# Patient Record
Sex: Female | Born: 1940
Health system: Southern US, Community
[De-identification: ages and names within clinical notes are randomized; demographics above are authoritative.]

## PROBLEM LIST (undated history)

## (undated) DIAGNOSIS — Z87891 Personal history of nicotine dependence: Secondary | ICD-10-CM

## (undated) DIAGNOSIS — Z853 Personal history of malignant neoplasm of breast: Secondary | ICD-10-CM

## (undated) DIAGNOSIS — F329 Major depressive disorder, single episode, unspecified: Secondary | ICD-10-CM

## (undated) DIAGNOSIS — R0600 Dyspnea, unspecified: Secondary | ICD-10-CM

## (undated) DIAGNOSIS — C50919 Malignant neoplasm of unspecified site of unspecified female breast: Secondary | ICD-10-CM

## (undated) DIAGNOSIS — J302 Other seasonal allergic rhinitis: Secondary | ICD-10-CM

## (undated) DIAGNOSIS — E669 Obesity, unspecified: Secondary | ICD-10-CM

## (undated) DIAGNOSIS — E119 Type 2 diabetes mellitus without complications: Secondary | ICD-10-CM

## (undated) DIAGNOSIS — K760 Fatty (change of) liver, not elsewhere classified: Secondary | ICD-10-CM

## (undated) DIAGNOSIS — M1990 Unspecified osteoarthritis, unspecified site: Secondary | ICD-10-CM

## (undated) DIAGNOSIS — Z85118 Personal history of other malignant neoplasm of bronchus and lung: Secondary | ICD-10-CM

## (undated) DIAGNOSIS — G4730 Sleep apnea, unspecified: Secondary | ICD-10-CM

## (undated) DIAGNOSIS — L309 Dermatitis, unspecified: Secondary | ICD-10-CM

## (undated) DIAGNOSIS — K2100 Gastro-esophageal reflux disease with esophagitis, without bleeding: Secondary | ICD-10-CM

## (undated) DIAGNOSIS — Z8719 Personal history of other diseases of the digestive system: Secondary | ICD-10-CM

## (undated) DIAGNOSIS — Z1389 Encounter for screening for other disorder: Secondary | ICD-10-CM

## (undated) DIAGNOSIS — I10 Essential (primary) hypertension: Secondary | ICD-10-CM

## (undated) DIAGNOSIS — E785 Hyperlipidemia, unspecified: Secondary | ICD-10-CM

## (undated) DIAGNOSIS — I83893 Varicose veins of bilateral lower extremities with other complications: Secondary | ICD-10-CM

## (undated) DIAGNOSIS — J439 Emphysema, unspecified: Secondary | ICD-10-CM

## (undated) DIAGNOSIS — F32A Depression, unspecified: Secondary | ICD-10-CM

## (undated) DIAGNOSIS — Z9989 Dependence on other enabling machines and devices: Secondary | ICD-10-CM

## (undated) DIAGNOSIS — C3490 Malignant neoplasm of unspecified part of unspecified bronchus or lung: Secondary | ICD-10-CM

## (undated) DIAGNOSIS — K219 Gastro-esophageal reflux disease without esophagitis: Secondary | ICD-10-CM

## (undated) DIAGNOSIS — IMO0002 Reserved for concepts with insufficient information to code with codable children: Secondary | ICD-10-CM

## (undated) DIAGNOSIS — G709 Myoneural disorder, unspecified: Secondary | ICD-10-CM

## (undated) DIAGNOSIS — C439 Malignant melanoma of skin, unspecified: Secondary | ICD-10-CM

## (undated) DIAGNOSIS — J45909 Unspecified asthma, uncomplicated: Secondary | ICD-10-CM

## (undated) DIAGNOSIS — Z923 Personal history of irradiation: Secondary | ICD-10-CM

## (undated) HISTORY — PX: COLONOSCOPY: SHX174

## (undated) HISTORY — DX: Dependence on other enabling machines and devices: Z99.89

## (undated) HISTORY — DX: Obesity, unspecified: E66.9

## (undated) HISTORY — DX: Other seasonal allergic rhinitis: J30.2

## (undated) HISTORY — DX: Personal history of nicotine dependence: Z87.891

## (undated) HISTORY — DX: Sleep apnea, unspecified: G47.30

## (undated) HISTORY — DX: Varicose veins of bilateral lower extremities with other complications: I83.893

## (undated) HISTORY — DX: Personal history of malignant neoplasm of breast: Z85.3

## (undated) HISTORY — DX: Personal history of other malignant neoplasm of bronchus and lung: Z85.118

## (undated) HISTORY — DX: Reserved for concepts with insufficient information to code with codable children: IMO0002

## (undated) HISTORY — DX: Emphysema, unspecified: J43.9

## (undated) HISTORY — DX: Essential (primary) hypertension: I10

## (undated) HISTORY — DX: Unspecified osteoarthritis, unspecified site: M19.90

## (undated) HISTORY — DX: Type 2 diabetes mellitus without complications: E11.9

## (undated) HISTORY — PX: BREAST BIOPSY: SHX20

## (undated) HISTORY — DX: Malignant melanoma of skin, unspecified: C43.9

## (undated) HISTORY — DX: Unspecified asthma, uncomplicated: J45.909

## (undated) HISTORY — DX: Encounter for screening for other disorder: Z13.89

---

## 1970-04-10 HISTORY — PX: ABDOMINAL HYSTERECTOMY: SHX81

## 1970-04-10 HISTORY — PX: SALPINGOOPHORECTOMY: SHX82

## 1975-04-11 HISTORY — PX: KNEE SURGERY: SHX244

## 1987-04-11 HISTORY — PX: LOBECTOMY: SHX5089

## 1988-04-10 HISTORY — PX: HERNIA REPAIR: SHX51

## 1990-04-10 HISTORY — PX: EYE SURGERY: SHX253

## 1997-04-10 HISTORY — PX: KNEE SURGERY: SHX244

## 2004-04-19 ENCOUNTER — Ambulatory Visit: Payer: Self-pay | Admitting: Internal Medicine

## 2004-05-11 ENCOUNTER — Ambulatory Visit: Payer: Self-pay | Admitting: Internal Medicine

## 2005-01-04 ENCOUNTER — Ambulatory Visit: Payer: Self-pay | Admitting: Obstetrics and Gynecology

## 2005-01-12 ENCOUNTER — Ambulatory Visit: Payer: Self-pay | Admitting: Obstetrics and Gynecology

## 2005-02-01 ENCOUNTER — Ambulatory Visit: Payer: Self-pay | Admitting: Cardiology

## 2005-06-15 ENCOUNTER — Ambulatory Visit: Payer: Self-pay | Admitting: General Surgery

## 2005-06-19 ENCOUNTER — Ambulatory Visit: Payer: Self-pay | Admitting: General Surgery

## 2005-08-23 ENCOUNTER — Ambulatory Visit: Payer: Self-pay | Admitting: Family Medicine

## 2006-01-09 ENCOUNTER — Ambulatory Visit: Payer: Self-pay | Admitting: Specialist

## 2006-06-11 ENCOUNTER — Ambulatory Visit: Payer: Self-pay | Admitting: General Surgery

## 2006-08-06 ENCOUNTER — Ambulatory Visit: Payer: Self-pay | Admitting: Unknown Physician Specialty

## 2006-08-09 ENCOUNTER — Ambulatory Visit: Payer: Self-pay | Admitting: Unknown Physician Specialty

## 2006-10-04 ENCOUNTER — Ambulatory Visit: Payer: Self-pay | Admitting: Unknown Physician Specialty

## 2007-06-17 ENCOUNTER — Ambulatory Visit: Payer: Self-pay | Admitting: General Surgery

## 2007-11-15 ENCOUNTER — Ambulatory Visit: Payer: Self-pay | Admitting: Family Medicine

## 2007-12-09 ENCOUNTER — Ambulatory Visit: Payer: Self-pay | Admitting: Family Medicine

## 2008-04-10 DIAGNOSIS — Z923 Personal history of irradiation: Secondary | ICD-10-CM

## 2008-04-10 DIAGNOSIS — C50919 Malignant neoplasm of unspecified site of unspecified female breast: Secondary | ICD-10-CM

## 2008-04-10 HISTORY — PX: BREAST EXCISIONAL BIOPSY: SUR124

## 2008-04-10 HISTORY — DX: Malignant neoplasm of unspecified site of unspecified female breast: C50.919

## 2008-04-10 HISTORY — PX: BREAST SURGERY: SHX581

## 2008-04-10 HISTORY — DX: Personal history of irradiation: Z92.3

## 2008-04-10 HISTORY — PX: BREAST LUMPECTOMY: SHX2

## 2008-06-22 ENCOUNTER — Ambulatory Visit: Payer: Self-pay | Admitting: General Surgery

## 2008-06-25 ENCOUNTER — Ambulatory Visit: Payer: Self-pay | Admitting: General Surgery

## 2008-06-30 ENCOUNTER — Ambulatory Visit: Payer: Self-pay | Admitting: General Surgery

## 2008-07-09 ENCOUNTER — Ambulatory Visit: Payer: Self-pay | Admitting: Radiation Oncology

## 2008-07-13 ENCOUNTER — Ambulatory Visit: Payer: Self-pay | Admitting: Cardiology

## 2008-07-13 ENCOUNTER — Ambulatory Visit: Payer: Self-pay | Admitting: General Surgery

## 2008-07-16 ENCOUNTER — Ambulatory Visit: Payer: Self-pay | Admitting: General Surgery

## 2008-07-27 ENCOUNTER — Ambulatory Visit: Payer: Self-pay | Admitting: Radiation Oncology

## 2008-08-08 ENCOUNTER — Ambulatory Visit: Payer: Self-pay | Admitting: Radiation Oncology

## 2008-09-08 ENCOUNTER — Ambulatory Visit: Payer: Self-pay | Admitting: Radiation Oncology

## 2008-10-08 ENCOUNTER — Ambulatory Visit: Payer: Self-pay | Admitting: Radiation Oncology

## 2008-12-10 ENCOUNTER — Ambulatory Visit: Payer: Self-pay | Admitting: General Surgery

## 2009-02-08 ENCOUNTER — Ambulatory Visit: Payer: Self-pay | Admitting: Radiation Oncology

## 2009-02-15 ENCOUNTER — Ambulatory Visit: Payer: Self-pay | Admitting: Radiation Oncology

## 2009-03-10 ENCOUNTER — Ambulatory Visit: Payer: Self-pay | Admitting: Radiation Oncology

## 2009-06-29 ENCOUNTER — Ambulatory Visit: Payer: Self-pay | Admitting: General Surgery

## 2009-08-08 ENCOUNTER — Ambulatory Visit: Payer: Self-pay | Admitting: Radiation Oncology

## 2009-08-23 ENCOUNTER — Ambulatory Visit: Payer: Self-pay | Admitting: Radiation Oncology

## 2009-09-08 ENCOUNTER — Ambulatory Visit: Payer: Self-pay | Admitting: Radiation Oncology

## 2009-12-07 HISTORY — PX: MELANOMA EXCISION: SHX5266

## 2009-12-09 HISTORY — PX: VEIN SURGERY: SHX48

## 2010-07-04 ENCOUNTER — Ambulatory Visit: Payer: Self-pay | Admitting: General Surgery

## 2010-07-12 ENCOUNTER — Ambulatory Visit: Payer: Self-pay | Admitting: General Surgery

## 2010-08-18 ENCOUNTER — Ambulatory Visit: Payer: Self-pay | Admitting: Radiation Oncology

## 2010-09-09 ENCOUNTER — Ambulatory Visit: Payer: Self-pay | Admitting: Radiation Oncology

## 2010-09-23 ENCOUNTER — Ambulatory Visit: Payer: Self-pay | Admitting: Unknown Physician Specialty

## 2011-07-12 ENCOUNTER — Ambulatory Visit: Payer: Self-pay | Admitting: General Surgery

## 2011-10-25 ENCOUNTER — Ambulatory Visit: Payer: Self-pay | Admitting: Radiation Oncology

## 2011-11-09 ENCOUNTER — Ambulatory Visit: Payer: Self-pay | Admitting: Radiation Oncology

## 2012-02-29 ENCOUNTER — Ambulatory Visit: Payer: Self-pay | Admitting: Family Medicine

## 2012-06-01 ENCOUNTER — Encounter: Payer: Self-pay | Admitting: General Surgery

## 2012-07-30 ENCOUNTER — Ambulatory Visit: Payer: Self-pay | Admitting: General Surgery

## 2012-08-07 ENCOUNTER — Encounter: Payer: Self-pay | Admitting: General Surgery

## 2012-08-15 ENCOUNTER — Ambulatory Visit (INDEPENDENT_AMBULATORY_CARE_PROVIDER_SITE_OTHER): Payer: Medicare Other | Admitting: General Surgery

## 2012-08-15 ENCOUNTER — Other Ambulatory Visit: Payer: Self-pay | Admitting: *Deleted

## 2012-08-15 ENCOUNTER — Encounter: Payer: Self-pay | Admitting: General Surgery

## 2012-08-15 VITALS — BP 126/84 | HR 80 | Resp 16 | Ht 62.0 in | Wt 220.0 lb

## 2012-08-15 DIAGNOSIS — Z85118 Personal history of other malignant neoplasm of bronchus and lung: Secondary | ICD-10-CM

## 2012-08-15 DIAGNOSIS — Z853 Personal history of malignant neoplasm of breast: Secondary | ICD-10-CM | POA: Insufficient documentation

## 2012-08-15 NOTE — Patient Instructions (Signed)
Call for any problems otherwise follow up in one year.

## 2012-08-15 NOTE — Progress Notes (Signed)
The patient has been asked to return to the office in one year for a bilateral diagnostic mammogram. 

## 2012-08-15 NOTE — Progress Notes (Signed)
Patient ID: Kathryn Hammond, female   DOB: 1940/12/16, 72 y.o.   MRN: [id number]  Chief Complaint  Patient presents with  . Follow-up    mammogram follow up    HPI Kathryn Hammond is a 72 y.o. female here for follow up of yearly mammogram done on 07-30-12. No complaints of any breast symptoms. Patient does have a history of DCIS of left breast treated with lumpectomy and radiation therapy in 2010. Patient stated she does self breast exams.Patient is also s/r right lung lobectomy for cancer in 1989. Has had RF ablations of GSV bilateral. HPI  Past Medical History  Diagnosis Date  . Seasonal allergies   . Cellulitis and abscess of trunk   . Emphysema   . Unspecified essential hypertension   . Personal history of malignant neoplasm of bronchus and lung     right lung cancer  . Personal history of tobacco use, presenting hazards to health   . Asthma   . Ulcer     peptic  . Sleep apnea   . Arthritis   . Personal history of malignant neoplasm of breast     left breast DCIS  . Breast screening, unspecified   . Special screening for malignant neoplasms, colon   . Diabetes     non-insulin dependent  . Obesity, unspecified   . Varicose veins of lower extremities with other complications   . Screening for obesity   . Melanoma     right temple  . CPAP (continuous positive airway pressure) dependence 15 years    Past Surgical History  Procedure Laterality Date  . Knee surgery  1999    knee replaced  . Knee surgery  1977    knees replaced  . Salpingoophorectomy  1972  . Abdominal hysterectomy  1972  . Lobectomy  1989  . Hernia repair  1990  . Eye surgery  1992  . Colonoscopy  2007 and 2012    Dr. Mechele Collin at [facility]; polyps  . 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 Melanoma excision  December 07, 2009    right temple  . Breast surgery Left 2010    mammosite balloon placement and removal  . Breast lumpectomy Left  2010    R for DCIS  . Breast biopsy      Family History  Problem Relation Age of Onset  . Ovarian cancer Mother     Social History History  Substance Use Topics  . Smoking status: Former Smoker -- 30 years    Quit date: 04/10/1988  . Smokeless tobacco: Not on file  . Alcohol Use: Yes     Comment: once in a while    Allergies  Allergen Reactions  . Tape Itching  . Tegaderm Ag Mesh (Silver) Other (See Comments)    blisters  . Zantac (Ranitidine Hcl) Hives    Current Outpatient Prescriptions  Medication Sig Dispense Refill  . Acetaminophen (TYLENOL 8 HOUR PO) Take by mouth 3 (three) times daily.       . Cholecalciferol (DIALYVITE VITAMIN D 5000 PO) Take 1 tablet by mouth daily.       Marland Kitchen lisinopril-hydrochlorothiazide (PRINZIDE,ZESTORETIC) 20-12.5 MG per tablet Take 1 tablet by mouth daily.      Marland Kitchen METOPROLOL TARTRATE PO Take 100 mg by mouth daily.       Marland Kitchen omeprazole (PRILOSEC) 20 MG capsule Take 20 mg by mouth daily as needed.       Marland Kitchen  celecoxib (CELEBREX) 200 MG capsule Take 200 mg by mouth as needed for pain.      . simvastatin (ZOCOR) 80 MG tablet Take 80 mg by mouth at bedtime.       No current facility-administered medications for this visit.    Review of Systems Review of Systems  Constitutional: Negative.   HENT: Negative.   Respiratory: Negative.   Cardiovascular: Negative.   Gastrointestinal: Negative.     Blood pressure 126/84, pulse 80, resp. rate 16, height 5\' 2"  (1.575 m), weight 220 lb (99.791 kg).  Physical Exam Physical Exam  Constitutional: She is oriented to person, place, and time. She appears well-developed and well-nourished.  Eyes: Conjunctivae are normal. No scleral icterus.  Neck: Neck supple.  Cardiovascular: Normal rate, regular rhythm and normal heart sounds.   Pulses:      Dorsalis pedis pulses are 2+ on the right side, and 2+ on the left side.       Posterior tibial pulses are 2+ on the right side, and 2+ on the left side.  No edema  in the legs, no visible varicose veins.  Pulmonary/Chest: Effort normal and breath sounds normal. Right breast exhibits no inverted nipple, no mass, no nipple discharge, no skin change and no tenderness. Left breast exhibits no inverted nipple, no mass, no nipple discharge, no skin change and no tenderness.  Upper inner quadrant well healed lumpectomy scar.  Irregular firm area consistent with scar unchanged from previous years.   Abdominal: Soft. Bowel sounds are normal.  Lymphadenopathy:    She has no cervical adenopathy.    She has no axillary adenopathy.  Neurological: She is alert and oriented to person, place, and time.  Skin: Skin is warm and dry.    Data Reviewed Mammogram reviewed cat 2. Assessment    Stable exam.    Plan    Follow up in one year       SANKAR,SEEPLAPUTHUR G 08/15/2012, 9:00 PM

## 2012-10-22 ENCOUNTER — Ambulatory Visit: Payer: Self-pay | Admitting: Radiation Oncology

## 2013-04-06 ENCOUNTER — Inpatient Hospital Stay: Payer: Self-pay | Admitting: Student

## 2013-04-06 LAB — BASIC METABOLIC PANEL
Anion Gap: 6 — ABNORMAL LOW (ref 7–16)
BUN: 13 mg/dL (ref 7–18)
Calcium, Total: 9 mg/dL (ref 8.5–10.1)
Chloride: 106 mmol/L (ref 98–107)
Co2: 31 mmol/L (ref 21–32)
Creatinine: 0.7 mg/dL (ref 0.60–1.30)
EGFR (African American): >60
EGFR (Non-African Amer.): >60
Osmolality: 286 (ref 275–301)
Potassium: 3.8 mmol/L (ref 3.5–5.1)

## 2013-04-06 LAB — CBC
HGB: 14.4 g/dL (ref 12.0–16.0)
MCHC: 33.2 g/dL (ref 32.0–36.0)
MCV: 90 fL (ref 80–100)
Platelet: 163 10*3/uL (ref 150–440)
WBC: 5.5 10*3/uL (ref 3.6–11.0)

## 2013-04-06 LAB — TROPONIN I: Troponin-I: <0.02 ng/mL

## 2013-04-07 LAB — CBC WITH DIFFERENTIAL/PLATELET
Basophil %: 0.3 %
Eosinophil #: 0 10*3/uL (ref 0.0–0.7)
Eosinophil %: 0.8 %
Lymphocyte #: 1 10*3/uL (ref 1.0–3.6)
Lymphocyte %: 16.9 %
MCH: 29.3 pg (ref 26.0–34.0)
MCHC: 32.7 g/dL (ref 32.0–36.0)
Monocyte #: 0.8 x10 3/mm (ref 0.2–0.9)
Monocyte %: 13.9 %
Neutrophil #: 4.1 10*3/uL (ref 1.4–6.5)
Neutrophil %: 68.1 %
Platelet: 154 10*3/uL (ref 150–440)

## 2013-04-07 LAB — LIPID PANEL
Ldl Cholesterol, Calc: 135 mg/dL — ABNORMAL HIGH (ref 0–100)
Triglycerides: 202 mg/dL — ABNORMAL HIGH (ref 0–200)
VLDL Cholesterol, Calc: 40 mg/dL (ref 5–40)

## 2013-04-07 LAB — BASIC METABOLIC PANEL
Anion Gap: 6 — ABNORMAL LOW (ref 7–16)
Calcium, Total: 8.8 mg/dL (ref 8.5–10.1)
Co2: 32 mmol/L (ref 21–32)
EGFR (Non-African Amer.): >60
Osmolality: 287 (ref 275–301)
Potassium: 3.6 mmol/L (ref 3.5–5.1)
Sodium: 143 mmol/L (ref 136–145)

## 2013-04-07 LAB — TSH: Thyroid Stimulating Horm: 2.81 u[IU]/mL

## 2013-04-09 ENCOUNTER — Observation Stay: Payer: Self-pay | Admitting: Internal Medicine

## 2013-04-09 LAB — URINALYSIS, COMPLETE
Bacteria: NONE SEEN
Bilirubin,UR: NEGATIVE
Blood: NEGATIVE
Glucose,UR: NEGATIVE mg/dL (ref 0–75)
Hyaline Cast: 4
Ketone: NEGATIVE
Leukocyte Esterase: NEGATIVE
Nitrite: NEGATIVE
Protein: NEGATIVE
RBC,UR: 1 /HPF (ref 0–5)
Squamous Epithelial: <1
WBC UR: 6 /HPF (ref 0–5)

## 2013-04-09 LAB — CBC WITH DIFFERENTIAL/PLATELET
Basophil #: 0 10*3/uL (ref 0.0–0.1)
Basophil %: 0.6 %
Eosinophil #: 0 10*3/uL (ref 0.0–0.7)
Eosinophil %: 0.2 %
HCT: 44.6 % (ref 35.0–47.0)
HGB: 14.9 g/dL (ref 12.0–16.0)
Lymphocyte #: 1.1 10*3/uL (ref 1.0–3.6)
Lymphocyte %: 13.4 %
MCH: 30.3 pg (ref 26.0–34.0)
MCHC: 33.5 g/dL (ref 32.0–36.0)
MCV: 91 fL (ref 80–100)
Monocyte #: 0.8 x10 3/mm (ref 0.2–0.9)
Monocyte %: 10 %
Neutrophil #: 6 10*3/uL (ref 1.4–6.5)
Neutrophil %: 75.8 %
Platelet: 183 10*3/uL (ref 150–440)
RBC: 4.93 10*6/uL (ref 3.80–5.20)
RDW: 14.7 % — ABNORMAL HIGH (ref 11.5–14.5)
WBC: 8 10*3/uL (ref 3.6–11.0)

## 2013-04-09 LAB — COMPREHENSIVE METABOLIC PANEL
Albumin: 3.4 g/dL (ref 3.4–5.0)
Alkaline Phosphatase: 70 U/L
Anion Gap: 4 — ABNORMAL LOW (ref 7–16)
BUN: 16 mg/dL (ref 7–18)
Bilirubin,Total: 0.4 mg/dL (ref 0.2–1.0)
Calcium, Total: 9 mg/dL (ref 8.5–10.1)
Creatinine: 0.94 mg/dL (ref 0.60–1.30)
Osmolality: 276 (ref 275–301)
SGOT(AST): 45 U/L — ABNORMAL HIGH (ref 15–37)
SGPT (ALT): 56 U/L (ref 12–78)
Total Protein: 7 g/dL (ref 6.4–8.2)

## 2013-04-09 LAB — TROPONIN I: Troponin-I: <0.02 ng/mL

## 2013-04-10 ENCOUNTER — Ambulatory Visit: Payer: Self-pay | Admitting: Neurology

## 2013-04-10 LAB — LIPID PANEL
Cholesterol: 132 mg/dL (ref 0–200)
HDL Cholesterol: 50 mg/dL (ref 40–60)
Ldl Cholesterol, Calc: 61 mg/dL (ref 0–100)
Triglycerides: 104 mg/dL (ref 0–200)
VLDL Cholesterol, Calc: 21 mg/dL (ref 5–40)

## 2013-04-14 ENCOUNTER — Emergency Department: Payer: Self-pay | Admitting: Emergency Medicine

## 2013-04-14 LAB — URINALYSIS, COMPLETE
BILIRUBIN, UR: NEGATIVE
BLOOD: NEGATIVE
GLUCOSE, UR: NEGATIVE mg/dL (ref 0–75)
Hyaline Cast: 7
NITRITE: NEGATIVE
Ph: 6 (ref 4.5–8.0)
Protein: NEGATIVE
RBC,UR: 4 /HPF (ref 0–5)
Specific Gravity: 1.03 (ref 1.003–1.030)
WBC UR: 21 /HPF (ref 0–5)

## 2013-04-14 LAB — COMPREHENSIVE METABOLIC PANEL
ALK PHOS: 75 U/L
ALT: 63 U/L (ref 12–78)
AST: 70 U/L — AB (ref 15–37)
Albumin: 3.6 g/dL (ref 3.4–5.0)
Anion Gap: 6 — ABNORMAL LOW (ref 7–16)
BUN: 23 mg/dL — AB (ref 7–18)
Bilirubin,Total: 0.8 mg/dL (ref 0.2–1.0)
CALCIUM: 9.1 mg/dL (ref 8.5–10.1)
CREATININE: 0.63 mg/dL (ref 0.60–1.30)
Chloride: 100 mmol/L (ref 98–107)
Co2: 26 mmol/L (ref 21–32)
Glucose: 131 mg/dL — ABNORMAL HIGH (ref 65–99)
Osmolality: 270 (ref 275–301)
Potassium: 4.7 mmol/L (ref 3.5–5.1)
Sodium: 132 mmol/L — ABNORMAL LOW (ref 136–145)
TOTAL PROTEIN: 7.5 g/dL (ref 6.4–8.2)

## 2013-04-14 LAB — CBC WITH DIFFERENTIAL/PLATELET
BASOS ABS: 0 10*3/uL (ref 0.0–0.1)
Basophil %: 0.2 %
EOS PCT: 0.5 %
Eosinophil #: 0.1 10*3/uL (ref 0.0–0.7)
HCT: 44.9 % (ref 35.0–47.0)
HGB: 14.8 g/dL (ref 12.0–16.0)
LYMPHS ABS: 0.6 10*3/uL — AB (ref 1.0–3.6)
LYMPHS PCT: 5.4 %
MCH: 29.2 pg (ref 26.0–34.0)
MCHC: 33 g/dL (ref 32.0–36.0)
MCV: 89 fL (ref 80–100)
MONO ABS: 1.1 x10 3/mm — AB (ref 0.2–0.9)
Monocyte %: 9.3 %
Neutrophil #: 9.7 10*3/uL — ABNORMAL HIGH (ref 1.4–6.5)
Neutrophil %: 84.6 %
PLATELETS: 231 10*3/uL (ref 150–440)
RBC: 5.06 10*6/uL (ref 3.80–5.20)
RDW: 14.2 % (ref 11.5–14.5)
WBC: 11.4 10*3/uL — AB (ref 3.6–11.0)

## 2013-04-14 LAB — LIPASE, BLOOD: Lipase: 145 U/L (ref 73–393)

## 2013-08-13 ENCOUNTER — Ambulatory Visit (INDEPENDENT_AMBULATORY_CARE_PROVIDER_SITE_OTHER): Payer: Medicare Other | Admitting: General Surgery

## 2013-08-13 ENCOUNTER — Encounter: Payer: Self-pay | Admitting: General Surgery

## 2013-08-13 VITALS — BP 134/72 | HR 74 | Resp 14 | Ht 62.0 in | Wt 212.0 lb

## 2013-08-13 DIAGNOSIS — Z85118 Personal history of other malignant neoplasm of bronchus and lung: Secondary | ICD-10-CM

## 2013-08-13 DIAGNOSIS — Z853 Personal history of malignant neoplasm of breast: Secondary | ICD-10-CM

## 2013-08-13 NOTE — Progress Notes (Signed)
Patient ID: Kathryn Hammond, female   DOB: [birth year], 73 y.o.   MRN: [id number]  Chief Complaint  Patient presents with  . Follow-up    mammogram    HPI Kathryn Hammond is a 73 y.o. female who presents for a breast evaluation. The most recent mammogram was done on 08/08/13. Patient does perform regular self breast checks and gets regular mammograms done.  The patient denies any new problems with the breasts at this time.    HPI  Past Medical History  Diagnosis Date  . Seasonal allergies   . Cellulitis and abscess of trunk   . Emphysema   . Unspecified essential hypertension   . Personal history of malignant neoplasm of bronchus and lung     right lung cancer  . Personal history of tobacco use, presenting hazards to health   . Asthma   . Ulcer     peptic  . Sleep apnea   . Arthritis   . Personal history of malignant neoplasm of breast     left breast DCIS  . Breast screening, unspecified   . Special screening for malignant neoplasms, colon   . Diabetes     non-insulin dependent  . Obesity, unspecified   . Varicose veins of lower extremities with other complications   . Screening for obesity   . Melanoma     right temple  . CPAP (continuous positive airway pressure) dependence 15 years    Past Surgical History  Procedure Laterality Date  . Knee surgery  1999    knee replaced  . Knee surgery  1977    knees replaced  . Salpingoophorectomy  1972  . Abdominal hysterectomy  1972  . Lobectomy  1989  . Hernia repair  1990  . Eye surgery  1992  . Colonoscopy  2007 and 2012    Dr. Vira Agar at [facility]; polyps  . 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 Melanoma excision  December 07, 2009    right temple  . Breast surgery Left 2010    mammosite balloon placement and removal  . Breast lumpectomy Left 2010    R for DCIS  . Breast biopsy      Family History  Problem Relation Age of Onset  . Ovarian cancer  Mother     Social History History  Substance Use Topics  . Smoking status: Former Smoker -- 30 years    Quit date: 04/10/1988  . Smokeless tobacco: Not on file  . Alcohol Use: Yes     Comment: once in a while    Allergies  Allergen Reactions  . Lisinopril Swelling  . Tape Itching  . Tegaderm Ag Mesh [Silver] Other (See Comments)    blisters  . Zantac [Ranitidine Hcl] Hives    Current Outpatient Prescriptions  Medication Sig Dispense Refill  . Acetaminophen (TYLENOL 8 HOUR PO) Take by mouth 3 (three) times daily.       Marland Kitchen amLODipine (NORVASC) 5 MG tablet Take 1 tablet by mouth daily.      Marland Kitchen aspirin 81 MG tablet Take 81 mg by mouth daily.      Marland Kitchen atorvastatin (LIPITOR) 40 MG tablet Take 1 tablet by mouth daily.      Marland Kitchen BIOTIN PO Take 2 tablets by mouth daily.      . celecoxib (CELEBREX) 200 MG capsule Take 200 mg by mouth as needed for pain.      Marland Kitchen  Cholecalciferol (DIALYVITE VITAMIN D 5000 PO) Take 1 tablet by mouth daily.       . hydrochlorothiazide (HYDRODIURIL) 12.5 MG tablet Take 1 tablet by mouth daily.      Marland Kitchen losartan (COZAAR) 100 MG tablet Take 1 tablet by mouth daily at 6 (six) AM.      . METOPROLOL TARTRATE PO Take 100 mg by mouth daily.       Marland Kitchen omeprazole (PRILOSEC) 20 MG capsule Take 20 mg by mouth daily as needed.        No current facility-administered medications for this visit.    Review of Systems Review of Systems  Constitutional: Negative.   Respiratory: Negative.   Cardiovascular: Negative.     Blood pressure 134/72, pulse 74, resp. rate 14, height 5\' 2"  (1.575 m), weight 212 lb (96.163 kg).  Physical Exam Physical Exam  Constitutional: She is oriented to person, place, and time. She appears well-developed and well-nourished.  Eyes: Conjunctivae are normal. No scleral icterus.  Neck: Neck supple. No thyromegaly present.  Cardiovascular: Normal rate, regular rhythm and normal heart sounds.   Pulses:      Dorsalis pedis pulses are 2+ on the right  side, and 2+ on the left side.       Posterior tibial pulses are 2+ on the right side, and 2+ on the left side.  1 + edema bilaterally. Mild skn discoloration in both legs. No induration or tenderness of skin. No VV but few prominent veins noted.  Pulmonary/Chest: Effort normal and breath sounds normal.   Right breast exhibits no inverted nipple, no mass, no nipple discharge, no skin change and no tenderness. Left breast exhibits no inverted nipple, no mass, no nipple discharge, no skin change and no tenderness.  Abdominal: Soft. Normal appearance and bowel sounds are normal. There is no hepatosplenomegaly. There is no tenderness. No hernia.  Lymphadenopathy:    She has no cervical adenopathy.    She has no axillary adenopathy.  Neurological: She is alert and oriented to person, place, and time. She has normal strength. No sensory deficit.  Skin: Skin is warm and dry.    Data Reviewed  Mammogram reviewed  Assessment    Exam stable. Past history of right lung [phone]), left breast cancer(DCIS, 2010).    Plan    Patient to return in 1 year with bilateral diagnostic mammogram.        Brighton Delio G Vaniah Chambers 08/13/2013, 1:22 PM

## 2013-08-13 NOTE — Patient Instructions (Signed)
Patient to return in 1 year with bilateral diagnostic mammogram. Continue self breast exams. Call office for any new breast issues or concerns.

## 2013-09-24 DIAGNOSIS — K219 Gastro-esophageal reflux disease without esophagitis: Secondary | ICD-10-CM | POA: Insufficient documentation

## 2013-12-03 DIAGNOSIS — G4730 Sleep apnea, unspecified: Secondary | ICD-10-CM | POA: Insufficient documentation

## 2014-02-09 ENCOUNTER — Encounter: Payer: Self-pay | Admitting: General Surgery

## 2014-04-10 HISTORY — PX: MOHS SURGERY: SUR867

## 2014-06-17 DIAGNOSIS — E782 Mixed hyperlipidemia: Secondary | ICD-10-CM | POA: Insufficient documentation

## 2014-07-14 ENCOUNTER — Encounter: Payer: Self-pay | Admitting: *Deleted

## 2014-07-23 ENCOUNTER — Other Ambulatory Visit: Payer: Self-pay

## 2014-07-23 DIAGNOSIS — Z853 Personal history of malignant neoplasm of breast: Secondary | ICD-10-CM

## 2014-07-31 NOTE — H&P (Signed)
PATIENT NAME:  Kathryn Hammond, FAISON MR#:  [id number] DATE OF BIRTH:  [birth year]  DATE OF ADMISSION:  04/06/2013  PRIMARY CARE PHYSICIAN:  Dr. Lovie Macadamia   REFERRING PHYSICIAN:  Dr. Reita Cliche  CHIEF COMPLAINT: High blood pressure more than 180 for three days.   HISTORY OF PRESENT ILLNESS:  This is 74 year old Caucasian female with history of hypertension, diabetes, chronic obstructive pulmonary disease, hyperlipidemia, presents to the ED with high blood pressure more than 180s  for 3 days. The patient is alert, awake, oriented and in no acute distress. According to the patient, the patient has had headache and dizziness. She measured her blood pressure at home. Blood pressure was more than 180s for the past three days. She came to the ED for further evaluation. Her blood pressure was 266/128 in the ED.  She was treated with one dose of IV nicardipein with the Lopressor 25 mg and hydralazine 25 mg p.o. Blood pressure decreased to 173/78. The patient denies any chest pain, palpitation, orthopnea, or nocturnal dyspnea. No leg edema. The patient denies any syncope, loss of consciousness or seizure. No weakness.   PAST MEDICAL HISTORY: Hypertension, diabetes, chronic obstructive pulmonary disease, hyperlipidemia, osteoarthritis, breast cancer, lung cancer, myeloma.  PAST SURGICAL HISTORY:  Lung cancer surgery, breast surgery, hysterectomy, right knee replacement,  lower extremity vein surgery and melanoma resection.    FAMILY HISTORY: Cancer in and her family, but no hypertension, diabetes, heart attack or stroke.   SOCIAL HISTORY: No smoking or drinking or illicit drugs.   ALLERGIES: LISINOPRIL, ZANTAC, PEANUTS, ADHESIVE, BAND AIDS.   HOME MEDICATIONS: 1.  Vitamin D3 5000 international units p.o. once a day.  2.  Tylenol caplet, extra 500 mg 2 tabs in the morning.  3.  Zocor 80 mg p.o. daily.  4.  Omeprazole 20 mg p.o. once a day p.r.n.  5.  Lopressor 100 mg p.o. daily.  6.  Metformin 750 mg p.o. daily.   7.  Losartan 100 mg p.o. one tablet once a day.  8.  Hydralazine 25 mg p.o. t.i.d.  9.  Celebrex 200 mg p.o. p.r.n.  10.  CPAP at night.  11.  Atrovent HFA 17 mcg inhalation 2 puffs every four hours p.r.n.  12.  Advair 250 mcg/50 mcg p.o. 1 puff inhaled twice a day.   REVIEW OF SYSTEMS:  CONSTITUTIONAL: The patient denies any fever or chills. Has a headache or dizziness, but no weakness.  EYES: No double vision or blurry vision.  ENT: No postnasal drip, slurred speech or dysphagia.  CARDIOVASCULAR: No chest pain, palpitation, orthopnea, or nocturnal dyspnea. No leg edema.  PULMONARY: No cough, sputum, shortness of breath or hemoptysis.  GASTROINTESTINAL: No abdominal pain, nausea, vomiting or diarrhea.  GENITOURINARY: No dysuria, hematuria, or incontinence.  SKIN: No rash or jaundice.  NEUROLOGY: No syncope, loss of consciousness or seizure.  HEMATOLOGY: No easy bruising or bleeding.  ENDOCRINOLOGY: No polyuria, polydipsia, heat or cold intolerance.   LABORATORY, DIAGNOSTIC OR RADIOLOGIC DATA: Glucose 117, BUN 13, creatinine 0.7. Electrolytes normal. CBC in normal range. Troponin less than 0.02.    Duplex of right lower extremity: No evidence of deep vein thrombosis.   IMPRESSION:  1.  Hypertension malignancy.  2.  Diabetes.  3.  Chronic obstructive pulmonary disease.  4.  Obesity.   PLAN OF TREATMENT: 1.  The patient will be admitted to medical floor with telemonitor. We will give hydralazine IV as needed. If blood pressure is still not, controlled, we will start Cardizem drip.  2.  We will continue losartan, hydralazine, Lopressor and adjust dose depending on the patient's blood pressure.  3.  For diabetes, we will start a sliding scale.  4.  I discussed patient's condition and plan of treatment with the patient.   CODE STATUS:  The patient wants full code.   TIME SPENT: About 53 minutes    ____________________________ Demetrios Loll, MD qc:cc D: 04/06/2013 20:50:40  ET T: 04/06/2013 22:57:42 ET JOB#: [id number]  cc: Demetrios Loll, MD, <Dictator> Demetrios Loll MD ELECTRONICALLY SIGNED 04/07/2013 21:04

## 2014-08-01 NOTE — H&P (Signed)
PATIENT NAME:  Kathryn Hammond, Kathryn Hammond MR#:  [id number] DATE OF BIRTH:  1940/09/25  DATE OF ADMISSION:  04/09/2013  REFERRING PHYSICIAN: Dr. Reita Cliche.   PRIMARY CARE PHYSICIAN: Dr. Lovie Macadamia.   CHIEF COMPLAINT: Blurred vision.   HISTORY OF PRESENT ILLNESS: A 74 year old Caucasian female with a past medical history of hypertension, diabetes type 2, COPD and hyperlipidemia who is presenting with blurred vision and aphasia. She describes acute onset of symptoms earlier today, lasting in total 1-1/2 hours. Poor vision in both eyes without associated eye pain; however, she did notice associated headache which she described as all over, throbbing in nature, nonradiating, 6 out of 10 in intensity. No worsening or relieving factors. She also noted calling her husband. He, however, noted that she had slurred speech. She was unaware of this. All symptoms had resolved in 1-1/2 hours, by time of arrival in the Emergency Department. Currently, she is without complaints.   REVIEW OF SYSTEMS:   CONSTITUTIONAL: Denies fever, fatigue, weakness.  EYES: Denies current blurred vision, double vision or eye pain.  EARS, NOSE, THROAT: Denies tinnitus, ear pain, hearing loss.  RESPIRATORY: Denies cough, wheeze, shortness of breath.  CARDIOVASCULAR: Denies chest pain, palpitations, edema,  GASTROINTESTINAL: Denies nausea, vomiting or diarrhea.  GENITOURINARY: Denies dysuria or hematuria.  ENDOCRINE: Denies nocturia or thyroid problems.  HEMATOLOGIC AND LYMPHATIC: Denies easy bruising or bleeding.  SKIN: Denies rash or lesions.  MUSCULOSKELETAL: Denies pain in neck, back, shoulder, knees, hips or arthritic symptoms.  NEUROLOGIC: Positive for headache as described above. Denies any numbness or weakness.  PSYCHIATRIC: Denies any anxiety or depressive symptoms.   Otherwise, full review of systems performed by me is negative.   PAST MEDICAL HISTORY: Hypertension, diabetes, COPD, hyperlipidemia, osteoarthritis, breast cancer,  lung cancer and myeloma.   PAST SURGICAL HISTORY: Lung cancer.   FAMILY HISTORY: Denies any history of neurological disorders, including strokes or TIAs. Denies hypertension or cardiovascular disease.   SOCIAL HISTORY: Denies any alcohol, tobacco or drug usage.   ALLERGIES: LISINOPRIL, ZANTAC, PEANUTS AND ADHESIVES.   HOME MEDICATIONS: Include Advair 250/50 mcg inhalation b.i.d., Atrovent 17 mcg inhalation 2 puffs every 4 hours as needed for shortness of breath, hydralazine 25 mg p.o. t.i.d., losartan 100 mg p.o. daily, metformin 750 mg extended release p.o. daily, metoprolol extended release 100 mg p.o. daily, Prilosec 20 mg as needed for indigestion, simvastatin 80 mg p.o. daily, Tylenol extra strength 500 mg 2 tablets in the morning, vitamin D3 5000 international units p.o. daily.   PHYSICAL EXAMINATION:  VITAL SIGNS: Temperature 99.1, heart rate 84, respirations 16, blood pressure 162/75, saturating 92% on room air.  GENERAL: Well-nourished, well-developed, Caucasian female currently in no distress.  HEAD: Normocephalic, atraumatic.  EYES: Pupils equal, round and reactive to light. Extraocular muscle intact. No scleral icterus.  MOUTH: Moist mucous membranes. Dentition intact. No abscesses noted.  EARS, NOSE, THROAT: Throat clear without exudates. No external lesions.  NECK: Supple. No thyromegaly. No nodules. No JVD.  PULMONARY: Clear to auscultation bilaterally. No wheezes, rales or rhonchi. No use of accessory muscles. Good respiratory effort.  CHEST: Nontender to palpation.  CARDIOVASCULAR: S1, S2, regular rate and rhythm. No murmurs, rubs or gallops. No edema. Pedal pulses 2+ bilaterally.  GASTROINTESTINAL: Soft, nontender, nondistended. No masses. Positive bowel sounds. No hepatosplenomegaly.   MUSCULOSKELETAL: No swelling, clubbing or edema. Range of motion full in all extremities.  NEUROLOGIC: Cranial nerves II through XII intact. No gross focal neurological deficits.  Sensation  intact. Reflexes intact. Strength 5 out of  5 in all extremities including proximal and distal flexor and extensor. Pronator drift within normal limits. Gait deferred at this time.  SKIN: No ulcerations, lesions, rash or cyanosis. Skin warm, dry. Turgor is intact.  PSYCHIATRIC: Mood and affect within normal limits, though mildly anxious. Awake, alert and oriented x 3. Insight and judgment intact.   LABORATORY DATA: Sodium 136, potassium 3.7, chloride 100, bicarb 32, BUN 16, creatinine 0.94, glucose 135. LFTs within normal limits. Troponin I less than 0.02. WBC 8, hemoglobin 14.9, platelets 183. Urinalysis negative for evidence of infection. CT head performed revealing no acute intracranial process.   ASSESSMENT AND PLAN: A 74 year old Caucasian female with history of hypertension, diabetes, chronic obstructive pulmonary disease, hyperlipidemia. Presenting with blurred vision and aphasia.  1. Transient ischemic attack: Admit to observation to telemetry. Check an echocardiogram, MRI, lipids, carotid Doppler. Neurology checks q.4 hours; however, the symptoms are possibly related to blood pressure since it  was elevated. She has been given an aspirin and statin already.  2. Hypertension, poorly controlled: Restart home medications. Will add p.r.n. hydralazine intravenous as needed for systolic blood pressure greater than 245 or diastolic greater than 809; however, blood pressure is currently in the 160s/70s.  3. Chronic obstructive pulmonary disease: Continue home dose of Advair.  4. Type 2 diabetes: Hold p.o. agents. Add insulin sliding scale and q.6 hour Accu-Cheks.  5. Venous thromboembolism prophylaxis with sequential compression devices.   The patient is FULL CODE.   TIME SPENT: 45 minutes.   ____________________________ Aaron Mose. Hower, MD dkh:gb D: 04/09/2013 22:19:32 ET T: 04/09/2013 22:35:11 ET JOB#: [id number]  cc: Aaron Mose. Hower, MD, <Dictator> DAVID Woodfin Ganja MD ELECTRONICALLY SIGNED  04/10/2013 2:28

## 2014-08-01 NOTE — Consult Note (Signed)
PATIENT NAME:  Kathryn Hammond, Kathryn Hammond MR#:  [id number] DATE OF BIRTH:  [birth year]  DATE OF CONSULTATION:  04/10/2013  REFERRING PHYSICIAN:   CONSULTING PHYSICIAN:  Leotis Pain, MD  REASON FOR CONSULTATION:  Headaches, rule out transient ischemic attack.  HISTORY OF PRESENT ILLNESS:  This is a 74 year old Caucasian female with past medical history of hypertension, diabetes type 2, COPD, hyperlipidemia who presents with headaches and transient blurred vision, as well as aphasia lasting up to 1.5 hours. When questioned chronic history, the patient states she has chronic headaches that occur 3 to 4 times a month lasting half a day and relieved with over-the-counter Tylenol. No photophobia. Yesterday, the patient states that she was having throbbing headache 6 to 8 out of 10 in intensity, acute in bilateral occipital lobes radiating to the frontal lobes and at which time it was noticed that her speech appeared to be gargled and difficulty getting words out and she complained of binocular diplopia. Symptoms resolved within an hour and a half and the patient was back to baseline when she was admitted to the hospital. Currently headache is much improved 2 to 3 out of 10.   REVIEW OF SYSTEMS: The patient denies fever, fatigue or any weakness.    EYES: Denies any blurred vision, denies any tinnitus, ear pain.  RESPIRATORY: Denies cough, wheezing, shortness of breath.  CARDIOVASCULAR: Denies any chest pain, palpitations or any edema. Denies any nausea or any vomiting.  ENDOCRINE: Denies nocturia or thyroid problems.  NEUROLOGICAL: Currently back to baseline, chronic headaches.  PSYCHIATRIC: Denies any anxiety and depression.   PAST MEDICAL HISTORY: Hypertension, diabetes, COPD, hyperlipidemia.   PAST SURGICAL HISTORY: Lung cancer.   FAMILY HISTORY: Denies any history of neurological disease.   ALLERGIES: LISINOPRIL, ZANTAC, PEANUTS AND ADHESIVES.   SOCIAL HISTORY: Denies any alcohol, tobacco or drug  use.   HOME MEDICATIONS: Include Advair, Atrovent, hydralazine, losartan, metformin, Tylenol and vitamin D3.  IMAGING:  CAT scan of the head did not show acute intracranial abnormalities. Ultrasound carotid arteries suggesting 50-69%bilateral stenosis.   LABORATORY WORKUP: Was reviewed.   VITAL SIGNS: Include a temperature of 98.5, pulse 65, respirations 20, blood pressure 147/84.   NEUROLOGIC: The patient is alert, awake, oriented to time, place, location, the reason why she is in the hospital. Speech appears to be fluent. No double vision currently present. Cranial nerve examination: Extraocular movements are intact. Pupils 3 mm to 2 mm light reactive bilaterally. Tongue is midline. Facial sensation intact. Facial motor is intact. Shoulder shrug intact. Motor strength examination:  5/5 bilateral upper and lower extremities. Reflexes are 1+ diminished throughout. Sensation intact to light touch and proprioception. Coordination: Finger-to-nose intact. Gait: The patient was able to ambulate to the bathroom without assistance.  IMPRESSIONS: A 74 year old Caucasian female with past medical history of hypertension, diabetes, chronic obstructive pulmonary disease, hyperlipidemia, presenting with binocular diplopia and aphasia that has resolved. The patient has had headache at the same time as the symptoms have onset and headache is much improved. The patient has a chronic history of headaches 3 to 4 times a month, described as a similar episode that she presented with yesterday. At this point, I think this is probably a complicated migraine rather than transient ischemic attack, but again it is hard to exclude a transient ischemic attack at this point.   PLAN: The patient is not on aspirin. Will start her on baby aspirin of 81 mg daily. She should be on magnesium 400 mg twice a day as an  outpatient and vitamin B complex as outpatient daily  This was described and explained to the patient along with her  family. At this point, I do not think there is a need to hold her in hospital to keep her for MRI, which can be done as outpatient as she is currently back to baseline. Carotids were reviewed. The patient has significant hemodynamic stenosis that is bilateral. She should follow up with vascular surgery as outpatient in about 2 to 4 weeks.  Thank you. It was a pleasure seeing this patient.   ____________________________ Leotis Pain, MD yz:ce D: 04/10/2013 11:59:02 ET T: 04/10/2013 12:35:44 ET JOB#: [id number]  cc: Leotis Pain, MD, <Dictator> Leotis Pain MD ELECTRONICALLY SIGNED 04/11/2013 14:52

## 2014-08-01 NOTE — Discharge Summary (Signed)
PATIENT NAME:  Kathryn Hammond, Kathryn Hammond MR#:  [id number] DATE OF BIRTH:  [birth year]  DATE OF ADMISSION:  04/06/2013 DATE OF DISCHARGE:  04/08/2013  PRIMARY CARE PHYSICIAN: Dr. Lovie Macadamia.   CHIEF COMPLAINT: Elevated blood pressure for three days.   DISCHARGE DIAGNOSES: 1. Accelerated malignant hypertension.  2. Diabetes.  3. Hyperlipidemia.  4. Chronic obstructive pulmonary disease.  5. Obesity.  6. Osteoarthritis.  7. History of breast cancer.  8. History of lung cancer. 9. History of myeloma.   DISCHARGE MEDICATIONS: Hydralazine 25 mg daily, metoprolol extended-release 100 mg once a day, omeprazole 20 mg once a day as needed, Atrovent 17 mcg inhaled 2 puffs every four hours as needed, Advair 250/50 mcg 1 puff 2 times a day, Tylenol Extra Strength 500 mg 2 tabs once a day in the morning, CPAP at night, vitamin D3 5000 units once a day, losartan 100 mg daily, simvastatin 80 mg once a day, metformin 750 mg extended-release 1 tab once a day.   DIET: Low sodium, low fat, low cholesterol.   ACTIVITY: As tolerated, ADA diet.   Please check with PCP within 1 to 2 weeks for blood pressure check.   DISPOSITION: Home.  HISTORY OF PRESENT ILLNESS AND HOSPITAL COURSE: For full details of H and P, please see the dictation on 12/28 by Dr. Bridgett Larsson, but briefly this is a pleasant 74 year old with multiple comorbidities including COPD, hyperlipidemia, who came in for high blood pressure for three days and they are all more than 180s, and here, the patient had a very labile blood pressure with blood pressure going as high as 260s. She did get IV blood pressure medications, and the blood pressure did come down, and hospitalist service were contacted for further evaluation and management. Of note, the patient also had been started on Celebrex recently for her ongoing osteoarthritis. Here, she had a negative troponin and had normal BUN and creatinine. Her TSH was checked and it was 2.81. She, of note, also had been on  hydralazine 25 mg 3 times a day as needed for blood pressure. Here, she was continued on her outpatient medications and low-dose amlodipine was added in addition to the hydralazine, however, ultimately, she was discharged with hydralazine as dictated above in addition to her losartan and metoprolol. Her blood pressure has remained stable. She tends to have high blood pressure in the morning before taking her medications, but with her current medications her blood pressures are acceptable. Of note, late in the morning her last blood pressure was 117/74 on the above three medications. The patient does have an element of anxiety as well which accentuates the situation. At this point we will be discharging her with above medication.   CODE STATUS: THE PATIENT IS FULL CODE.   Total Time Spent: 32 minutes.   ____________________________ Vivien Presto, MD sa:sg D: 04/08/2013 13:00:29 ET T: 04/08/2013 13:43:19 ET JOB#: [id number]  cc: Vivien Presto, MD, <Dictator> Youlanda Roys. Lovie Macadamia, MD  Vivien Presto MD ELECTRONICALLY SIGNED 04/22/2013 11:19

## 2014-08-01 NOTE — Discharge Summary (Signed)
PATIENT NAME:  Kathryn Hammond, Kathryn Hammond MR#:  [id number] DATE OF BIRTH:  28-Jul-1940  DATE OF ADMISSION:  04/09/2013 DATE OF DISCHARGE:  04/10/2013  ADMITTING DIAGNOSIS: Transient ischemic attack.    DISCHARGE DIAGNOSES:  1.  Complicated migraine, questionable transient ischemic attack. 2.  Chronic obstructive pulmonary disease exacerbation.    3.  Hypertension.  4.  Hyperlipidemia with LDL of 61, well controlled.  5.  History of obesity.  6.  Obstructive sleep apnea.  7.  Peptic ulcer disease.  8.  Bilateral internal carotid artery stenosis of 60% to 79% according to carotid ultrasound; however, CT angiogram of neck arteries is normal, just reviewed and approximately 70% left subclavian artery stenosis.   Vascular surgery evaluation as outpatient was suggested.   DISCHARGE CONDITION: Stable.   DISCHARGE MEDICATIONS: The patient is to continue Atrovent 2 puffs every 4 hours as needed, Advair Diskus 250/50 one puff twice daily, Tylenol 500 mg two capsules once daily, vitamin D3,  5000 units once daily, losartan 100 mg p.o. daily, metformin 750 mg p.o. daily, metoprolol succinate 100 mg p.o. daily, omeprazole 20 mg p.o. daily as needed, simvastatin 80 mg p.o. daily, Maxalt 10 mg every 2 hours as needed.  Topiramate 100 mg p.o. daily.  Aspirin 81 mg p.o. daily.  Amlodipine 5 mg p.o. daily.   THE PATIENT WAS ADVISED NOT TO TAKE HYDRALAZINE UNLESS RECOMMENDED BY PRIMARY CARE PHYSICIAN AS IT MAY CAUSE NAUSEA AS WELL AS EXACERBATION OF HER HEADACHES.   HOME OXYGEN: None.   DIET: Two g salt, low fat, low cholesterol, regular consistency.   ACTIVITY LIMITATIONS: As tolerated.    FOLLOWUP APPOINTMENT: With Dr. Lovie Macadamia in 2 days after discharge, Dr. Irish Elders in 1 to 2 weeks after discharge, Dr. Lucky Cowboy or Dr. Delana Meyer in 1 to 2 days after discharge for subclavian stenosis evaluation.   CONSULTANTS: Dr. Irish Elders.    RADIOLOGIC STUDIES: CT of head without contrast, 31st of December 2014, showed no  acute intracranial findings, mild microvascular disease according to radiologist.   CT angiography, 1st of January 2015, of the neck carotid arteries revealed atherosclerosis of the aortic arch, atherosclerosis of the brachiocephalic vessel origins, no stenosis of the innominate or left common carotid origin. A 50% to 70% stenosis of the left subclavian artery origin. Both common carotid arteries are quite tortuous, but there is no stenosis. Atherosclerotic calcification at both carotid bifurcations but no narrowing of the internal carotid arteries and therefore no stenosis. The vessels were quite tortuous. Cannot rule out a hilar mass based on the initial image of the study. The patient has not had chest imaging since November 2013; therefore, chest x-ray was suggested. This finding could simply represent the top of the right hilar vessels according to radiologist.  Chest x-ray, PA and lateral, the 1st of January 2015, revealed mild increased markings of the left lower lobe. Developing pneumonia is not excluded but the finding could be due to atelectasis related to asthma, hiatal hernia. The right hilum was prominent; however, the prominence was unchanged from April 2012 and November 2013 CT scans, does not demonstrated hilar mass or adenopathy.   Carotid ultrasound 1st of January 2015 revealing velocity measurements within the internal carotid arteries bilaterally suggestive of 60% to 79% stenosis, although no significant stenosis was seen on the grayscale and color-flow images, and normal IC: CC ratio. Would recommend further evaluation with CT of the carotid arteries according to radiologist.   Echocardiogram, 1st of January 2015,  showed left ventricular ejection fraction by  visual estimation 70% to 75%, normal global left ventricular systolic function, mild concentric left ventricular hypertrophy, mildly increased left ventricular septal thickness, moderately increased left ventricular posterior wall  thickness, mild tricuspid tubal ligation.   HISTORY OF PRESENT ILLNESS: The patient is a 74 year old African American female, with past medical history significant for history of chronic headaches, questionable migraine headaches, who presents to the hospital with complaints of blurry vision as well as worsening headaches. Please refer to Dr. Samantha Crimes admission note on the 31st of December 2014.   On arrival to the hospital, the patient was complaining of poor vision in both eyes without associated eye pain, and had significant headache, which was throbbing in nature but nonradiating, with no alleviating or worsening factors.   She also was noted to have some slurring of speech which she was not aware of.   VITAL SIGNS: On arrival to the Emergency Room, her temperature was 99.1, pulse was 84, respiration rate was 16, blood pressure 162/75, saturation was 92% on room air.   PHYSICAL EXAM: Unremarkable.   Of note, the patient's vision problems lasted approximately 1-1/2 hours.   The patient's lab data done in the Emergency Room on the 31st of December 2014 showed glucose of 155; otherwise, BMP was normal. The patient's lipid panel was also checked. LDL was found to be 61. Cholesterol was 132, triglycerides were 104, and HDL was 50. Liver enzymes revealed mild elevation of AST to 45. Troponin was less than 0.02. White blood cell count was normal at 8.0, hemoglobin was 14.9 and platelet count was 183. Absolute neutrophil count was also normal at 6.0.   Urinalysis was unremarkable except for 6 white blood cells.   CT of the head was unremarkable.   The patient was admitted to the hospital with concerns of transient ischemic attack to telemetry. Her echocardiogram was performed as well as mentioned above lipid panel as well as carotid ultrasound.   Neurology consultation was obtained. Neurologist, Dr. Irish Elders, saw the patient in consultation the next day, the 1st of January 2015, and felt that she  is back to baseline and her headache has much improved and felt this was chronic history of headaches and possibly complicated migraine. He recommended MRI of brain if the patient stays in the hospital; however, he felt that it could be done as outpatient and he recommended to continue daily aspirin therapy as well as vitamin B complex daily and daily magnesium 400 mg twice daily for headaches.   The patient was initiated on Maxalt as well as preventive therapy with Topiramate.   She was advised to hold her hydralazine as this medication was just recently started and could have contributed to her headaches.   Evaluation of her carotid arteries was entertained and the patient underwent ultrasound of her carotid arteries, which revealed 60% to 79% stenosis of bilateral ICAs. At that point, the patient had a CT angiogram of her carotid arteries which was unremarkable except a 70% left subclavian artery stenosis for which a vascular surgery evaluation as an outpatient was suggested and recommended.   The patient had a lipid panel checked which LDL was found to be 61,  quite well controlled. The patient was advised to continue her cholesterol-lowering medications as well as aspirin therapy and to start on headache prevention therapy with Topiramate and Maxalt as needed.   For her blood pressure which was poorly controlled on admission, the patient was advised to continue her medications except hydralazine and amlodipine 5 mg p.o.  daily dose was initiated. It is recommended to follow the patient's blood pressure readings as outpatient and make decisions of advance in blood pressure medications even higher if needed.   On the day of discharge, the patient did not complain of any significant headaches or vision problems. The patient was back to normal. Her vital signs were stable with temperature was 98.5, pulse was 65. Respiration rate was 20, blood pressure 147/84. Saturation was 93% to 95% on room air at  rest.   The patient is being discharged to home with the above-mentioned medications and followup.   TOTAL TIME SPENT: 40 minutes.    ____________________________ Theodoro Grist, MD rv:np D: 04/10/2013 20:17:57 ET T: 04/10/2013 21:30:10 ET JOB#: [id number]  cc: Theodoro Grist, MD, <Dictator> Youlanda Roys. Lovie Macadamia, MD Leotis Pain, MD Algernon Huxley, MD Katha Cabal, MD Calyn Rubi Ether Griffins MD ELECTRONICALLY SIGNED 04/24/2013 19:11

## 2014-08-24 ENCOUNTER — Ambulatory Visit (INDEPENDENT_AMBULATORY_CARE_PROVIDER_SITE_OTHER): Payer: PPO | Admitting: General Surgery

## 2014-08-24 ENCOUNTER — Encounter: Payer: Self-pay | Admitting: General Surgery

## 2014-08-24 VITALS — BP 130/82 | HR 68 | Resp 16 | Ht 62.0 in | Wt 210.0 lb

## 2014-08-24 DIAGNOSIS — Z85118 Personal history of other malignant neoplasm of bronchus and lung: Secondary | ICD-10-CM

## 2014-08-24 DIAGNOSIS — Z853 Personal history of malignant neoplasm of breast: Secondary | ICD-10-CM | POA: Diagnosis not present

## 2014-08-24 NOTE — Progress Notes (Signed)
Patient ID: Kathryn Hammond, female   DOB: February 24, 1941, 74 y.o.   MRN: [id number]  Chief Complaint  Patient presents with  . Follow-up    mammogram    HPI AURI Kathryn Hammond is a 74 y.o. female who presents for a breast evaluation with a history of left breast cancer. The most recent mammogram was done on 08/18/14. Patient does perform regular self breast checks and gets regular mammograms done. She reports that she is doing well and not having any breast problems.    HPI  Past Medical History  Diagnosis Date  . Seasonal allergies   . Emphysema   . Unspecified essential hypertension   . Personal history of malignant neoplasm of bronchus and lung     right lung cancer  . Personal history of tobacco use, presenting hazards to health   . Asthma   . Ulcer     peptic  . Sleep apnea   . Arthritis   . Personal history of malignant neoplasm of breast     left breast DCIS  . Diabetes     non-insulin dependent  . Obesity, unspecified   . Varicose veins of lower extremities with other complications   . Screening for obesity   . Melanoma     right temple  . CPAP (continuous positive airway pressure) dependence 15 years    Past Surgical History  Procedure Laterality Date  . Knee surgery  1999    knee replaced  . Knee surgery  1977    knees replaced  . Salpingoophorectomy  1972  . Abdominal hysterectomy  1972  . Lobectomy  1989  . Hernia repair  1990  . Eye surgery  1992  . Colonoscopy  2007 and 2012    Dr. Vira Hammond at [facility]; polyps  . 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 Melanoma excision  December 07, 2009    right temple  . Breast surgery Left 2010    mammosite balloon placement and removal  . Breast lumpectomy Left 2010    R for DCIS  . Breast biopsy    . Mohs surgery  2016    [facility], nose    Family History  Problem Relation Age of Onset  . Ovarian cancer Mother     Social History History  Substance  Use Topics  . Smoking status: Former Smoker -- 30 years    Quit date: 04/10/1988  . Smokeless tobacco: Not on file  . Alcohol Use: Yes     Comment: once in a while    Allergies  Allergen Reactions  . Lisinopril Swelling  . Tape Itching  . Tegaderm Ag Mesh [Silver] Other (See Comments)    blisters  . Zantac [Ranitidine Hcl] Hives    Current Outpatient Prescriptions  Medication Sig Dispense Refill  . Acetaminophen (TYLENOL 8 HOUR PO) Take by mouth 3 (three) times daily.     Marland Kitchen amLODipine (NORVASC) 5 MG tablet Take 1 tablet by mouth daily.    Marland Kitchen aspirin 81 MG tablet Take 81 mg by mouth daily.    Marland Kitchen atorvastatin (LIPITOR) 40 MG tablet Take 1 tablet by mouth daily.    Marland Kitchen BIOTIN PO Take 2 tablets by mouth daily.    . celecoxib (CELEBREX) 200 MG capsule Take 200 mg by mouth as needed for pain.    . Cholecalciferol (DIALYVITE VITAMIN D 5000 PO) Take 1 tablet by mouth daily.     Marland Kitchen  hydrochlorothiazide (HYDRODIURIL) 12.5 MG tablet Take 1 tablet by mouth daily.    Marland Kitchen losartan (COZAAR) 100 MG tablet Take 1 tablet by mouth daily at 6 (six) AM.    . METOPROLOL TARTRATE PO Take 100 mg by mouth daily.     Marland Kitchen omeprazole (PRILOSEC) 20 MG capsule Take 20 mg by mouth daily as needed.      No current facility-administered medications for this visit.    Review of Systems Review of Systems  Constitutional: Negative.   Respiratory: Negative.   Cardiovascular: Negative.     Blood pressure 130/82, pulse 68, resp. rate 16, height '5\' 2"'$  (1.575 m), weight 210 lb (95.255 kg).  Physical Exam Physical Exam  Constitutional: She is oriented to person, place, and time. She appears well-developed and well-nourished.  Eyes: Conjunctivae are normal. No scleral icterus.  Neck: Neck supple.  Cardiovascular: Normal rate, regular rhythm and normal heart sounds.   Pulmonary/Chest: Effort normal and breath sounds normal. Right breast exhibits no inverted nipple, no mass, no nipple discharge, no skin change and no  tenderness. Left breast exhibits no inverted nipple, no mass, no nipple discharge, no skin change and no tenderness.    Abdominal: Soft. Normal appearance.  Lymphadenopathy:    She has no cervical adenopathy.    She has no axillary adenopathy.  Neurological: She is alert and oriented to person, place, and time.    Data Reviewed Mammogram-stable  Assessment    Stable exam, history of lung and breast cancer     Plan    Follow up in one year with bilateral diagnostic mammogram    PCP: Dr Maretta Bees 08/24/2014, 10:30 AM

## 2014-08-24 NOTE — Patient Instructions (Signed)
Continue monthly self breast exam. Call for any changes

## 2014-10-13 DIAGNOSIS — E119 Type 2 diabetes mellitus without complications: Secondary | ICD-10-CM | POA: Insufficient documentation

## 2014-12-13 DIAGNOSIS — I10 Essential (primary) hypertension: Secondary | ICD-10-CM | POA: Diagnosis present

## 2014-12-13 DIAGNOSIS — I159 Secondary hypertension, unspecified: Secondary | ICD-10-CM | POA: Diagnosis not present

## 2014-12-13 DIAGNOSIS — F439 Reaction to severe stress, unspecified: Secondary | ICD-10-CM | POA: Insufficient documentation

## 2014-12-13 DIAGNOSIS — Z7982 Long term (current) use of aspirin: Secondary | ICD-10-CM | POA: Diagnosis not present

## 2014-12-13 DIAGNOSIS — Z79899 Other long term (current) drug therapy: Secondary | ICD-10-CM | POA: Insufficient documentation

## 2014-12-13 DIAGNOSIS — Z87891 Personal history of nicotine dependence: Secondary | ICD-10-CM | POA: Diagnosis not present

## 2014-12-13 DIAGNOSIS — E119 Type 2 diabetes mellitus without complications: Secondary | ICD-10-CM | POA: Diagnosis not present

## 2014-12-13 LAB — URINALYSIS COMPLETE WITH MICROSCOPIC (ARMC ONLY)
BACTERIA UA: NONE SEEN
Bilirubin Urine: NEGATIVE
Glucose, UA: 150 mg/dL — AB
Hgb urine dipstick: NEGATIVE
Ketones, ur: NEGATIVE mg/dL
LEUKOCYTES UA: NEGATIVE
Nitrite: NEGATIVE
PH: 6 (ref 5.0–8.0)
PROTEIN: NEGATIVE mg/dL
RBC / HPF: NONE SEEN RBC/hpf (ref 0–5)
SQUAMOUS EPITHELIAL / LPF: NONE SEEN
Specific Gravity, Urine: 1.002 — ABNORMAL LOW (ref 1.005–1.030)

## 2014-12-13 LAB — CBC
HCT: 42.2 % (ref 35.0–47.0)
Hemoglobin: 14.2 g/dL (ref 12.0–16.0)
MCH: 29.3 pg (ref 26.0–34.0)
MCHC: 33.7 g/dL (ref 32.0–36.0)
MCV: 86.8 fL (ref 80.0–100.0)
PLATELETS: 171 10*3/uL (ref 150–440)
RBC: 4.87 MIL/uL (ref 3.80–5.20)
RDW: 13.8 % (ref 11.5–14.5)
WBC: 5.7 10*3/uL (ref 3.6–11.0)

## 2014-12-13 LAB — BASIC METABOLIC PANEL
Anion gap: 10 (ref 5–15)
BUN: 18 mg/dL (ref 6–20)
CHLORIDE: 98 mmol/L — AB (ref 101–111)
CO2: 30 mmol/L (ref 22–32)
Calcium: 9.3 mg/dL (ref 8.9–10.3)
Creatinine, Ser: 1.11 mg/dL — ABNORMAL HIGH (ref 0.44–1.00)
GFR calc Af Amer: 55 mL/min — ABNORMAL LOW (ref >60)
GFR calc non Af Amer: 48 mL/min — ABNORMAL LOW (ref >60)
Glucose, Bld: 203 mg/dL — ABNORMAL HIGH (ref 65–99)
Potassium: 3.3 mmol/L — ABNORMAL LOW (ref 3.5–5.1)
Sodium: 138 mmol/L (ref 135–145)

## 2014-12-13 LAB — GLUCOSE, CAPILLARY: Glucose-Capillary: 215 mg/dL — ABNORMAL HIGH (ref 65–99)

## 2014-12-13 NOTE — ED Notes (Addendum)
Patient reports "not feeling well all day, nauseated."  States took BP at home and it was 189/89 with hr in the 90's.  "I took 1/2 a pill and then it was 160's/80' with hr still in the 90's".  Also reports feeling weak.

## 2014-12-14 ENCOUNTER — Emergency Department
Admission: EM | Admit: 2014-12-14 | Discharge: 2014-12-14 | Disposition: A | Discharge status: Home / Self Care | Payer: PPO | Attending: Emergency Medicine | Admitting: Emergency Medicine

## 2014-12-14 DIAGNOSIS — F439 Reaction to severe stress, unspecified: Secondary | ICD-10-CM

## 2014-12-14 DIAGNOSIS — I159 Secondary hypertension, unspecified: Secondary | ICD-10-CM

## 2014-12-14 LAB — TROPONIN I

## 2014-12-14 NOTE — ED Notes (Signed)
Pt [facility] on stretcher in exam room with no distress noted; family at bedside; Patient reports "not feeling well all day, nauseated." States took BP at home and it was 189/89 with hr in the 90's. "I took 1/2 a pill and then it was 160's/80' with hr still in the 90's". Pt denies any c/o at present, stating feeling much better; pt A&Ox3; MAEW, PERRL; resp even/unlab, strong and equal periph pulses; abd soft/nondist/nontender, +BS

## 2014-12-14 NOTE — Discharge Instructions (Signed)
1. Continue blood pressure medicines as directed by your doctor. 2. You may take clonazepam as prescribed by your doctor for increased stress/anxiety. 3. Return to the ER for recurrent or worsening symptoms, persistent vomiting, lethargy or other concerns.  Hypertension Hypertension, commonly called high blood pressure, is when the force of blood pumping through your arteries is too strong. Your arteries are the blood vessels that carry blood from your heart throughout your body. A blood pressure reading consists of a higher number over a lower number, such as 110/72. The higher number (systolic) is the pressure inside your arteries when your heart pumps. The lower number (diastolic) is the pressure inside your arteries when your heart relaxes. Ideally you want your blood pressure below 120/80. Hypertension forces your heart to work harder to pump blood. Your arteries may become narrow or stiff. Having hypertension puts you at risk for heart disease, stroke, and other problems.  RISK FACTORS Some risk factors for high blood pressure are controllable. Others are not.  Risk factors you cannot control include:   Race. You may be at higher risk if you are African American.  Age. Risk increases with age.  Gender. Men are at higher risk than women before age 12 years. After age 29, women are at higher risk than men. Risk factors you can control include:  Not getting enough exercise or physical activity.  Being overweight.  Getting too much fat, sugar, calories, or salt in your diet.  Drinking too much alcohol. SIGNS AND SYMPTOMS Hypertension does not usually cause signs or symptoms. Extremely high blood pressure (hypertensive crisis) may cause headache, anxiety, shortness of breath, and nosebleed. DIAGNOSIS  To check if you have hypertension, your health care provider will measure your blood pressure while you are seated, with your arm held at the level of your heart. It should be measured at  least twice using the same arm. Certain conditions can cause a difference in blood pressure between your right and left arms. A blood pressure reading that is higher than normal on one occasion does not mean that you need treatment. If one blood pressure reading is high, ask your health care provider about having it checked again. TREATMENT  Treating high blood pressure includes making lifestyle changes and possibly taking medicine. Living a healthy lifestyle can help lower high blood pressure. You may need to change some of your habits. Lifestyle changes may include:  Following the DASH diet. This diet is high in fruits, vegetables, and whole grains. It is low in salt, red meat, and added sugars.  Getting at least 2 hours of brisk physical activity every week.  Losing weight if necessary.  Not smoking.  Limiting alcoholic beverages.  Learning ways to reduce stress. If lifestyle changes are not enough to get your blood pressure under control, your health care provider may prescribe medicine. You may need to take more than one. Work closely with your health care provider to understand the risks and benefits. HOME CARE INSTRUCTIONS  Have your blood pressure rechecked as directed by your health care provider.   Take medicines only as directed by your health care provider. Follow the directions carefully. Blood pressure medicines must be taken as prescribed. The medicine does not work as well when you skip doses. Skipping doses also puts you at risk for problems.   Do not smoke.   Monitor your blood pressure at home as directed by your health care provider. SEEK MEDICAL CARE IF:   You think you are having  a reaction to medicines taken.  You have recurrent headaches or feel dizzy.  You have swelling in your ankles.  You have trouble with your vision. SEEK IMMEDIATE MEDICAL CARE IF:  You develop a severe headache or confusion.  You have unusual weakness, numbness, or feel  faint.  You have severe chest or abdominal pain.  You vomit repeatedly.  You have trouble breathing. MAKE SURE YOU:   Understand these instructions.  Will watch your condition.  Will get help right away if you are not doing well or get worse. Document Released: 03/27/2005 Document Revised: 08/11/2013 Document Reviewed: 01/17/2013 [facility] Patient Information 2015 [place], [place]. This information is not intended to replace advice given to you by your health care provider. Make sure you discuss any questions you have with your health care provider.  Stress and Stress Management Stress is a normal reaction to life events. It is what you feel when life demands more than you are used to or more than you can handle. Some stress can be useful. For example, the stress reaction can help you catch the last bus of the day, study for a test, or meet a deadline at work. But stress that occurs too often or for too long can cause problems. It can affect your emotional health and interfere with relationships and normal daily activities. Too much stress can weaken your immune system and increase your risk for physical illness. If you already have a medical problem, stress can make it worse. CAUSES  All sorts of life events may cause stress. An event that causes stress for one person may not be stressful for another person. Major life events commonly cause stress. These may be positive or negative. Examples include losing your job, moving into a new home, getting married, having a baby, or losing a loved one. Less obvious life events may also cause stress, especially if they occur day after day or in combination. Examples include working long hours, driving in traffic, caring for children, being in debt, or being in a difficult relationship. SIGNS AND SYMPTOMS Stress may cause emotional symptoms including, the following:  Anxiety. This is feeling worried, afraid, on edge, overwhelmed, or out of  control.  Anger. This is feeling irritated or impatient.  Depression. This is feeling sad, down, helpless, or guilty.  Difficulty focusing, remembering, or making decisions. Stress may cause physical symptoms, including the following:   Aches and pains. These may affect your head, neck, back, stomach, or other areas of your body.  Tight muscles or clenched jaw.  Low energy or trouble sleeping. Stress may cause unhealthy behaviors, including the following:   Eating to feel better (overeating) or skipping meals.  Sleeping too little, too much, or both.  Working too much or putting off tasks (procrastination).  Smoking, drinking alcohol, or using drugs to feel better. DIAGNOSIS  Stress is diagnosed through an assessment by your health care provider. Your health care provider will ask questions about your symptoms and any stressful life events.Your health care provider will also ask about your medical history and may order blood tests or other tests. Certain medical conditions and medicine can cause physical symptoms similar to stress. Mental illness can cause emotional symptoms and unhealthy behaviors similar to stress. Your health care provider may refer you to a mental health professional for further evaluation.  TREATMENT  Stress management is the recommended treatment for stress.The goals of stress management are reducing stressful life events and coping with stress in healthy ways.  Techniques  for reducing stressful life events include the following:  Stress identification. Self-monitor for stress and identify what causes stress for you. These skills may help you to avoid some stressful events.  Time management. Set your priorities, keep a calendar of events, and learn to say "no." These tools can help you avoid making too many commitments. Techniques for coping with stress include the following:  Rethinking the problem. Try to think realistically about stressful events rather  than ignoring them or overreacting. Try to find the positives in a stressful situation rather than focusing on the negatives.  Exercise. Physical exercise can release both physical and emotional tension. The key is to find a form of exercise you enjoy and do it regularly.  Relaxation techniques. These relax the body and mind. Examples include yoga, meditation, tai chi, biofeedback, deep breathing, progressive muscle relaxation, listening to music, being out in nature, journaling, and other hobbies. Again, the key is to find one or more that you enjoy and can do regularly.  Healthy lifestyle. Eat a balanced diet, get plenty of sleep, and do not smoke. Avoid using alcohol or drugs to relax.  Strong support network. Spend time with family, friends, or other people you enjoy being around.Express your feelings and talk things over with someone you trust. Counseling or talktherapy with a mental health professional may be helpful if you are having difficulty managing stress on your own. Medicine is typically not recommended for the treatment of stress.Talk to your health care provider if you think you need medicine for symptoms of stress. HOME CARE INSTRUCTIONS  Keep all follow-up visits as directed by your health care provider.  Take all medicines as directed by your health care provider. SEEK MEDICAL CARE IF:  Your symptoms get worse or you start having new symptoms.  You feel overwhelmed by your problems and can no longer manage them on your own. SEEK IMMEDIATE MEDICAL CARE IF:  You feel like hurting yourself or someone else. Document Released: 09/20/2000 Document Revised: 08/11/2013 Document Reviewed: 11/19/2012 [facility] Patient Information 2015 [place], [place]. This information is not intended to replace advice given to you by your health care provider. Make sure you discuss any questions you have with your health care provider.

## 2014-12-14 NOTE — ED Notes (Signed)
Pt. Going home with family. 

## 2014-12-14 NOTE — ED Provider Notes (Signed)
[facility] Emergency Department Provider Note  ____________________________________________  Time seen: Approximately 1:42 AM  I have reviewed the triage vital signs and the nursing notes.   HISTORY  Chief Complaint Hypertension   HPI Kathryn Hammond is a 74 y.o. female who presents to the ED from home with a chief complaint of elevated blood pressure. Patient states she has been under extreme stress recently secondary to her daughter having metastatic cancer, her husband who recently had a CVA, and she recently had a skin cancer removed from her nose one week ago. States she was emotionally upset this evening, didn't feel well, nauseated. Took her blood pressure at home which was 189/89. Took an extra one half amlodipine which brought the blood pressure down to 160s over 80s. Symptoms were associated with a mild headache which has currently resolved. Deniesfever, chills, chest pain, shortness of breath, abdominal pain, vomiting, diarrhea, weakness, numbness, tingling. Currently denies symptoms of nausea and is eager for discharge.   Past Medical History  Diagnosis Date  . Seasonal allergies   . Emphysema   . Unspecified essential hypertension   . Personal history of malignant neoplasm of bronchus and lung     right lung cancer  . Personal history of tobacco use, presenting hazards to health   . Asthma   . Ulcer     peptic  . Sleep apnea   . Arthritis   . Personal history of malignant neoplasm of breast     left breast DCIS  . Diabetes     non-insulin dependent  . Obesity, unspecified   . Varicose veins of lower extremities with other complications   . Screening for obesity   . Melanoma     right temple  . CPAP (continuous positive airway pressure) dependence 15 years    Patient Active Problem List   Diagnosis Date Noted  . History of breast cancer 08/15/2012  . History of lung cancer 08/15/2012    Past Surgical History  Procedure  Laterality Date  . Knee surgery  1999    knee replaced  . Knee surgery  1977    knees replaced  . Salpingoophorectomy  1972  . Abdominal hysterectomy  1972  . Lobectomy  1989  . Hernia repair  1990  . Eye surgery  1992  . Colonoscopy  2007 and 2012    Dr. [facility]a [facility]ar at [facility]; polyps  . 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 Melanoma excision  December 07, 2009    right temple  . Breast surgery Left 2010    mammosite balloon placement and removal  . Breast lumpectomy Left 2010    R for DCIS  . Breast biopsy    . Mohs surgery  2016    [facility], nose    Current Outpatient Rx  Name  Route  Sig  Dispense  Refill  . Acetaminophen (TYLENOL 8 HOUR PO)   Oral   Take by mouth 3 (three) times daily.          Marland Kitchen amLODipine (NORVASC) 5 MG tablet   Oral   Take 1 tablet by mouth daily.         Marland Kitchen aspirin 81 MG tablet   Oral   Take 81 mg by mouth daily.         Marland Kitchen atorvastatin (LIPITOR) 40 MG tablet   Oral   Take 1 tablet by mouth daily.         Marland Kitchen  BIOTIN PO   Oral   Take 2 tablets by mouth daily.         . celecoxib (CELEBREX) 200 MG capsule   Oral   Take 200 mg by mouth as needed for pain.         . Cholecalciferol (DIALYVITE VITAMIN D 5000 PO)   Oral   Take 1 tablet by mouth daily.          . hydrochlorothiazide (HYDRODIURIL) 12.5 MG tablet   Oral   Take 1 tablet by mouth daily.         Marland Kitchen losartan (COZAAR) 100 MG tablet   Oral   Take 1 tablet by mouth daily at 6 (six) AM.         . METOPROLOL TARTRATE PO   Oral   Take 100 mg by mouth daily.          Marland Kitchen omeprazole (PRILOSEC) 20 MG capsule   Oral   Take 20 mg by mouth daily as needed.            Allergies Lisinopril; Tape; Tegaderm ag mesh; and Zantac  Family History  Problem Relation Age of Onset  . Ovarian cancer Mother     Social History Social History  Substance Use Topics  . Smoking status: Former Smoker -- 30 years    Quit  date: 04/10/1988  . Smokeless tobacco: Not on file  . Alcohol Use: Yes     Comment: once in a while    Review of Systems Constitutional: No fever/chills Eyes: No visual changes. ENT: No sore throat. Cardiovascular: Denies chest pain. Respiratory: Denies shortness of breath. Gastrointestinal: No abdominal pain.  No nausea, no vomiting.  No diarrhea.  No constipation. Genitourinary: Negative for dysuria. Musculoskeletal: Negative for back pain. Skin: Negative for rash. Neurological: Positive for slight headache. Negative for focal weakness or numbness.  10-point ROS otherwise negative.  ____________________________________________   PHYSICAL EXAM:  VITAL SIGNS: ED Triage Vitals  Enc Vitals Group     BP 12/13/14 2058 177/84 mmHg     Pulse Rate 12/13/14 2056 87     Resp 12/13/14 2056 20     Temp 12/13/14 2056 98.2 F (36.8 C)     Temp Source 12/13/14 2056 Oral     SpO2 12/13/14 2058 94 %     Weight 12/13/14 2056 206 lb (93.441 kg)     Height 12/13/14 2056 5' 0.5" (1.537 m)     Head Cir --      Peak Flow --      Pain Score --      Pain Loc --      Pain Edu? --      Excl. in [place]? --     Constitutional: Alert and oriented. Well appearing and in no acute distress. Eyes: Conjunctivae are normal. PERRL. EOMI. Head: Atraumatic. Nose: No congestion/rhinnorhea. Mouth/Throat: Mucous membranes are moist.  Oropharynx non-erythematous. Neck: No stridor. No carotid bruits. Cardiovascular: Normal rate, regular rhythm. Grossly normal heart sounds.  Good peripheral circulation. Respiratory: Normal respiratory effort.  No retractions. Lungs CTAB. Gastrointestinal: Soft and nontender. No distention. No abdominal bruits. No CVA tenderness. Musculoskeletal: No lower extremity tenderness nor edema.  No joint effusions. Neurologic:  Normal speech and language. No gross focal neurologic deficits are appreciated. No gait instability. Skin:  Skin is warm, dry and intact. No rash  noted. Psychiatric: Mood and affect are normal. Speech and behavior are normal.  ____________________________________________   LABS (all labs ordered are listed,  but only abnormal results are displayed)  Labs Reviewed  BASIC METABOLIC PANEL - Abnormal; Notable for the following:    Potassium 3.3 (*)    Chloride 98 (*)    Glucose, Bld 203 (*)    Creatinine, Ser 1.11 (*)    GFR calc non Af Amer 48 (*)    GFR calc Af Amer 55 (*)    All other components within normal limits  URINALYSIS COMPLETEWITH MICROSCOPIC ([facility] ONLY) - Abnormal; Notable for the following:    Color, Urine COLORLESS (*)    APPearance CLEAR (*)    Glucose, UA 150 (*)    Specific Gravity, Urine 1.002 (*)    All other components within normal limits  GLUCOSE, CAPILLARY - Abnormal; Notable for the following:    Glucose-Capillary 215 (*)    All other components within normal limits  CBC  TROPONIN I  CBG MONITORING, ED   ____________________________________________  EKG  ED ECG REPORT I, Brytnee Bechler J, the attending physician, personally viewed and interpreted this ECG.   Date: 12/14/2014  EKG Time: 2108  Rate: 83  Rhythm: normal EKG, normal sinus rhythm  Axis: Normal  Intervals:none  ST&T Change: Nonspecific  ____________________________________________  RADIOLOGY  None ____________________________________________   PROCEDURES  Procedure(s) performed: None  Critical Care performed: No  ____________________________________________   INITIAL IMPRESSION / ASSESSMENT AND PLAN / ED COURSE  Pertinent labs & imaging results that were available during my care of the patient were reviewed by me and considered in my medical decision making (see chart for details).  74 year old female who presents with elevated blood pressure secondary to recent increased stressors. Blood pressure currently 145/69; patient voicing no complaints. Screening labs reassuring; awaiting troponin  level.  ----------------------------------------- 2:28 AM on 12/14/2014 -----------------------------------------  Updated patient and family members of negative troponin result. Patient has clonazepam from her PCP which she does not take. Encourage patient to take clonazepam as needed for increased stress/anxiety. Strict return precautions given. Patient and family verbalized understanding and agree with plan of care.  ____________________________________________   FINAL CLINICAL IMPRESSION(S) / ED DIAGNOSES  Final diagnoses:  Secondary hypertension, unspecified  Stress      Paulette Blanch, MD 12/14/14 [phone]

## 2015-01-26 ENCOUNTER — Encounter: Payer: Self-pay | Admitting: General Surgery

## 2015-01-26 ENCOUNTER — Ambulatory Visit (INDEPENDENT_AMBULATORY_CARE_PROVIDER_SITE_OTHER): Payer: PPO | Admitting: General Surgery

## 2015-01-26 VITALS — BP 130/84 | HR 70 | Resp 14 | Ht 60.5 in | Wt 207.0 lb

## 2015-01-26 DIAGNOSIS — R1013 Epigastric pain: Secondary | ICD-10-CM | POA: Diagnosis not present

## 2015-01-26 NOTE — Patient Instructions (Addendum)
The patient is aware to call back for any questions or concerns.  Patient is scheduled for an Ultrasound at [facility] location on 02/01/15 at 9:30 am. She is to arrive by 9:15 am and have nothing to eat or drink after midnight. Patient is aware of date, time, and instructions.

## 2015-01-26 NOTE — Progress Notes (Signed)
Patient ID: Kathryn Hammond, female   DOB: [birth year], 74 y.o.   MRN: [id number]  Chief Complaint  Patient presents with  . Hernia    HPI Kathryn Hammond is a 74 y.o. female.  Here today for evaluation of possible hernia. She states she is having some upper abdominal pain worse at night for at least 6 months and progressingly getting worse. It was even hard to get comfortable at night. Denies reflux or trouble swallowing. Bowels move regular every other day and no bleeding. She states the pain even hurts when she walks. She states the pain is not as bad as it was. She states the pain started in her right back/flank area the came around to her mid upper abdomen.   HPI  Past Medical History  Diagnosis Date  . Seasonal allergies   . Emphysema   . Unspecified essential hypertension   . Personal history of malignant neoplasm of bronchus and lung     right lung cancer  . Personal history of tobacco use, presenting hazards to health   . Asthma   . Ulcer     peptic  . Sleep apnea   . Arthritis   . Personal history of malignant neoplasm of breast     left breast DCIS  . Diabetes ([place])     non-insulin dependent  . Obesity, unspecified   . Varicose veins of lower extremities with other complications   . Screening for obesity   . Melanoma ([place])     right temple  . CPAP (continuous positive airway pressure) dependence 15 years    Past Surgical History  Procedure Laterality Date  . Knee surgery  1999    knee replaced  . Knee surgery  1977    knees replaced  . Salpingoophorectomy  1972  . Abdominal hysterectomy  1972  . Lobectomy  1989  . Hernia repair  1990  . Eye surgery  1992  . Colonoscopy  2007 and 2012    Dr. [facility]a Agar at [facility]; polyps  . 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 Melanoma excision  December 07, 2009    right temple  . Breast surgery Left 2010    mammosite balloon placement and removal  . Breast  lumpectomy Left 2010    R for DCIS  . Breast biopsy    . Mohs surgery  2016    [facility], nose    Family History  Problem Relation Age of Onset  . Ovarian cancer Mother     Social History Social History  Substance Use Topics  . Smoking status: Former Smoker -- 30 years    Quit date: 04/10/1988  . Smokeless tobacco: None  . Alcohol Use: Yes     Comment: once in a while    Allergies  Allergen Reactions  . Lisinopril Swelling  . Peanut-Containing Drug Products   . Tape Itching  . Tegaderm Ag Mesh [Silver] Other (See Comments)    blisters  . Zantac [Ranitidine Hcl] Hives    Current Outpatient Prescriptions  Medication Sig Dispense Refill  . Acetaminophen (TYLENOL 8 HOUR PO) Take by mouth daily.     Marland Kitchen amLODipine (NORVASC) 5 MG tablet Take 1 tablet by mouth daily.    Marland Kitchen aspirin 81 MG tablet Take 81 mg by mouth daily.    Marland Kitchen atorvastatin (LIPITOR) 40 MG tablet Take 1 tablet by mouth daily.    Marland Kitchen BIOTIN  PO Take 2 tablets by mouth daily.    . Cholecalciferol (DIALYVITE VITAMIN D 5000 PO) Take 1 tablet by mouth daily.     . clonazePAM (KLONOPIN) 0.5 MG tablet Take 0.25 mg by mouth 2 (two) times daily as needed for anxiety.    . hydrochlorothiazide (HYDRODIURIL) 12.5 MG tablet Take 1 tablet by mouth daily.    Marland Kitchen losartan (COZAAR) 100 MG tablet Take 1 tablet by mouth daily at 6 (six) AM.    . metFORMIN (GLUCOPHAGE-XR) 750 MG 24 hr tablet Take 750 mg by mouth.    . METOPROLOL TARTRATE PO Take 100 mg by mouth daily.     Marland Kitchen omeprazole (PRILOSEC) 20 MG capsule Take 20 mg by mouth daily as needed.      No current facility-administered medications for this visit.    Review of Systems Review of Systems  Constitutional: Negative.   Respiratory: Negative.   Cardiovascular: Negative.     Blood pressure 130/84, pulse 70, resp. rate 14, height 5' 0.5" (1.537 m), weight 207 lb (93.895 kg).  Physical Exam Physical Exam  Constitutional: She is oriented to person, place, and time. She appears  well-developed and well-nourished.  HENT:  Mouth/Throat: Oropharynx is clear and moist.  Eyes: Conjunctivae are normal. No scleral icterus.  Neck: Neck supple.  Cardiovascular: Normal rate, regular rhythm and normal heart sounds.   Pulmonary/Chest: Effort normal and breath sounds normal.  Abdominal: Soft. Normal appearance and bowel sounds are normal. There is no hepatomegaly. There is no tenderness.  Lymphadenopathy:    She has no cervical adenopathy.  Neurological: She is alert and oriented to person, place, and time.  Skin: Skin is warm.  Psychiatric: Her behavior is normal.    Data Reviewed Abdominal CT scan from 2015. Recent labs -lfts were normal. Assessment    Abdominal pain.Possible biliary source.    Plan    Ultrasound of gall bladder.     Patient is scheduled for an Ultrasound at [facility] location on 02/01/15 at 9:30 am. She is to arrive by 9:15 am and have nothing to eat or drink after midnight. Patient is aware of date, time, and instructions.   PCP:  Rozelle Logan 01/27/2015, 6:29 AM

## 2015-01-27 ENCOUNTER — Encounter: Payer: Self-pay | Admitting: General Surgery

## 2015-02-01 ENCOUNTER — Ambulatory Visit
Admission: RE | Admit: 2015-02-01 | Discharge: 2015-02-01 | Disposition: A | Discharge status: Home / Self Care | Payer: PPO | Source: Ambulatory Visit | Attending: General Surgery | Admitting: General Surgery

## 2015-02-01 DIAGNOSIS — R1013 Epigastric pain: Secondary | ICD-10-CM | POA: Insufficient documentation

## 2015-02-01 DIAGNOSIS — K760 Fatty (change of) liver, not elsewhere classified: Secondary | ICD-10-CM | POA: Diagnosis not present

## 2015-04-14 DIAGNOSIS — J208 Acute bronchitis due to other specified organisms: Secondary | ICD-10-CM | POA: Diagnosis not present

## 2015-04-14 DIAGNOSIS — B9689 Other specified bacterial agents as the cause of diseases classified elsewhere: Secondary | ICD-10-CM | POA: Diagnosis not present

## 2015-04-14 DIAGNOSIS — L299 Pruritus, unspecified: Secondary | ICD-10-CM | POA: Diagnosis not present

## 2015-06-01 ENCOUNTER — Encounter: Payer: Self-pay | Admitting: *Deleted

## 2015-06-14 DIAGNOSIS — M79609 Pain in unspecified limb: Secondary | ICD-10-CM | POA: Diagnosis not present

## 2015-06-14 DIAGNOSIS — E669 Obesity, unspecified: Secondary | ICD-10-CM | POA: Diagnosis not present

## 2015-06-14 DIAGNOSIS — I10 Essential (primary) hypertension: Secondary | ICD-10-CM | POA: Diagnosis not present

## 2015-06-14 DIAGNOSIS — I6523 Occlusion and stenosis of bilateral carotid arteries: Secondary | ICD-10-CM | POA: Diagnosis not present

## 2015-06-14 DIAGNOSIS — E119 Type 2 diabetes mellitus without complications: Secondary | ICD-10-CM | POA: Diagnosis not present

## 2015-06-14 DIAGNOSIS — E785 Hyperlipidemia, unspecified: Secondary | ICD-10-CM | POA: Diagnosis not present

## 2015-06-14 DIAGNOSIS — I6529 Occlusion and stenosis of unspecified carotid artery: Secondary | ICD-10-CM | POA: Diagnosis not present

## 2015-07-06 DIAGNOSIS — E119 Type 2 diabetes mellitus without complications: Secondary | ICD-10-CM | POA: Diagnosis not present

## 2015-07-06 DIAGNOSIS — R350 Frequency of micturition: Secondary | ICD-10-CM | POA: Diagnosis not present

## 2015-07-06 DIAGNOSIS — R399 Unspecified symptoms and signs involving the genitourinary system: Secondary | ICD-10-CM | POA: Diagnosis not present

## 2015-07-20 DIAGNOSIS — G4733 Obstructive sleep apnea (adult) (pediatric): Secondary | ICD-10-CM | POA: Diagnosis not present

## 2015-07-21 DIAGNOSIS — D225 Melanocytic nevi of trunk: Secondary | ICD-10-CM | POA: Diagnosis not present

## 2015-07-21 DIAGNOSIS — L501 Idiopathic urticaria: Secondary | ICD-10-CM | POA: Diagnosis not present

## 2015-07-21 DIAGNOSIS — Z85828 Personal history of other malignant neoplasm of skin: Secondary | ICD-10-CM | POA: Diagnosis not present

## 2015-07-21 DIAGNOSIS — D2261 Melanocytic nevi of right upper limb, including shoulder: Secondary | ICD-10-CM | POA: Diagnosis not present

## 2015-08-02 DIAGNOSIS — E782 Mixed hyperlipidemia: Secondary | ICD-10-CM | POA: Diagnosis not present

## 2015-08-02 DIAGNOSIS — Z794 Long term (current) use of insulin: Secondary | ICD-10-CM | POA: Diagnosis not present

## 2015-08-02 DIAGNOSIS — R0602 Shortness of breath: Secondary | ICD-10-CM | POA: Diagnosis not present

## 2015-08-02 DIAGNOSIS — K219 Gastro-esophageal reflux disease without esophagitis: Secondary | ICD-10-CM | POA: Diagnosis not present

## 2015-08-02 DIAGNOSIS — I10 Essential (primary) hypertension: Secondary | ICD-10-CM | POA: Diagnosis not present

## 2015-08-02 DIAGNOSIS — G4730 Sleep apnea, unspecified: Secondary | ICD-10-CM | POA: Diagnosis not present

## 2015-08-02 DIAGNOSIS — E119 Type 2 diabetes mellitus without complications: Secondary | ICD-10-CM | POA: Diagnosis not present

## 2015-08-18 DIAGNOSIS — R21 Rash and other nonspecific skin eruption: Secondary | ICD-10-CM | POA: Diagnosis not present

## 2015-08-25 ENCOUNTER — Ambulatory Visit: Payer: PPO | Admitting: General Surgery

## 2015-08-27 DIAGNOSIS — R928 Other abnormal and inconclusive findings on diagnostic imaging of breast: Secondary | ICD-10-CM | POA: Diagnosis not present

## 2015-08-27 DIAGNOSIS — R922 Inconclusive mammogram: Secondary | ICD-10-CM | POA: Diagnosis not present

## 2015-08-27 DIAGNOSIS — Z853 Personal history of malignant neoplasm of breast: Secondary | ICD-10-CM | POA: Diagnosis not present

## 2015-08-30 ENCOUNTER — Encounter: Payer: Self-pay | Admitting: General Surgery

## 2015-08-30 DIAGNOSIS — R0602 Shortness of breath: Secondary | ICD-10-CM | POA: Diagnosis not present

## 2015-09-01 DIAGNOSIS — L989 Disorder of the skin and subcutaneous tissue, unspecified: Secondary | ICD-10-CM | POA: Diagnosis not present

## 2015-09-01 DIAGNOSIS — X32XXXA Exposure to sunlight, initial encounter: Secondary | ICD-10-CM | POA: Diagnosis not present

## 2015-09-01 DIAGNOSIS — L501 Idiopathic urticaria: Secondary | ICD-10-CM | POA: Diagnosis not present

## 2015-09-01 DIAGNOSIS — L570 Actinic keratosis: Secondary | ICD-10-CM | POA: Diagnosis not present

## 2015-09-02 ENCOUNTER — Ambulatory Visit (INDEPENDENT_AMBULATORY_CARE_PROVIDER_SITE_OTHER): Payer: PPO | Admitting: General Surgery

## 2015-09-02 ENCOUNTER — Encounter: Payer: Self-pay | Admitting: General Surgery

## 2015-09-02 DIAGNOSIS — Z86000 Personal history of in-situ neoplasm of breast: Secondary | ICD-10-CM

## 2015-09-02 DIAGNOSIS — Z853 Personal history of malignant neoplasm of breast: Secondary | ICD-10-CM

## 2015-09-02 DIAGNOSIS — Z85118 Personal history of other malignant neoplasm of bronchus and lung: Secondary | ICD-10-CM | POA: Diagnosis not present

## 2015-09-02 NOTE — Patient Instructions (Addendum)
The patient is aware to call back for any questions or concerns. Patient will be asked to return to the office in one year with a bilateral screening mammogram. 

## 2015-09-02 NOTE — Progress Notes (Signed)
Patient ID: Kathryn Hammond, female   DOB: 1940-05-22, 75 y.o.   MRN: [id number]  Chief Complaint  Patient presents with  . Follow-up    mammogram    HPI Kathryn Hammond is a 75 y.o. female.  who presents for a breast evaluation. The most recent mammogram was done on 08-26-15.  Patient does perform regular self breast checks and gets regular mammograms done.  No new breast issues. She states she does have some occasional left side abdominal pain. I have reviewed the history of present illness with the patient.   HPI  Past Medical History  Diagnosis Date  . Seasonal allergies   . Emphysema   . Unspecified essential hypertension   . Personal history of malignant neoplasm of bronchus and lung     right lung cancer  . Personal history of tobacco use, presenting hazards to health   . Asthma   . Ulcer     peptic  . Sleep apnea   . Arthritis   . Personal history of malignant neoplasm of breast     left breast DCIS  . Diabetes ([place])     non-insulin dependent  . Obesity, unspecified   . Varicose veins of lower extremities with other complications   . Screening for obesity   . Melanoma ([place])     right temple  . CPAP (continuous positive airway pressure) dependence 15 years    Past Surgical History  Procedure Laterality Date  . Knee surgery  1999    knee replaced  . Knee surgery  1977    knees replaced  . Salpingoophorectomy  1972  . Abdominal hysterectomy  1972  . Lobectomy  1989  . Hernia repair  1990  . Eye surgery  1992  . Colonoscopy  2007 and 2012    Dr. Vira [facility]ar at [facility]; polyps  . 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 Melanoma excision  December 07, 2009    right temple  . Breast surgery Left 2010    mammosite balloon placement and removal  . Breast lumpectomy Left 2010    R for DCIS  . Breast biopsy    . Mohs surgery  2016    [facility], nose    Family History  Problem Relation Age of Onset  .  Ovarian cancer Mother     Social History Social History  Substance Use Topics  . Smoking status: Former Smoker -- 30 years    Quit date: 04/10/1988  . Smokeless tobacco: Never Used  . Alcohol Use: 0.0 oz/week    0 Standard drinks or equivalent per week     Comment: once in a while    Allergies  Allergen Reactions  . Lisinopril Swelling  . Peanut-Containing Drug Products   . Tape Itching  . Tegaderm Ag Mesh [Silver] Other (See Comments)    blisters  . Zantac [Ranitidine Hcl] Hives    Current Outpatient Prescriptions  Medication Sig Dispense Refill  . Acetaminophen (TYLENOL 8 HOUR PO) Take by mouth daily.     Marland Kitchen amLODipine (NORVASC) 5 MG tablet Take 1 tablet by mouth daily.    Marland Kitchen aspirin 81 MG tablet Take 81 mg by mouth daily.    Marland Kitchen atorvastatin (LIPITOR) 40 MG tablet Take 1 tablet by mouth daily.    Marland Kitchen BIOTIN PO Take 2 tablets by mouth daily.    . Cholecalciferol (DIALYVITE VITAMIN D 5000 PO) Take 1  tablet by mouth daily.     . hydrochlorothiazide (HYDRODIURIL) 12.5 MG tablet Take 1 tablet by mouth daily.    Marland Kitchen losartan (COZAAR) 100 MG tablet Take 1 tablet by mouth daily at 6 (six) AM.    . metFORMIN (GLUCOPHAGE-XR) 750 MG 24 hr tablet Take 750 mg by mouth.    . METOPROLOL TARTRATE PO Take 100 mg by mouth daily.     Marland Kitchen omeprazole (PRILOSEC) 20 MG capsule Take 20 mg by mouth daily as needed.      No current facility-administered medications for this visit.    Review of Systems Review of Systems  Constitutional: Negative.   Respiratory: Positive for cough.   Cardiovascular: Negative.     Blood pressure 124/80, pulse 68, resp. rate 16, height 5' (1.524 m), weight 211 lb (95.709 kg).  Physical Exam Physical Exam  Constitutional: She is oriented to person, place, and time. She appears well-developed and well-nourished.  HENT:  Mouth/Throat: Oropharynx is clear and moist.  Eyes: Conjunctivae are normal. No scleral icterus.  Neck: Neck supple.  Cardiovascular: Normal rate,  regular rhythm and normal heart sounds.   Pulmonary/Chest: Effort normal and breath sounds normal. Right breast exhibits no inverted nipple, no mass, no nipple discharge, no skin change and no tenderness. Left breast exhibits no inverted nipple, no mass, no nipple discharge, no skin change and no tenderness.    3 cm hard area central incision lumpectomy site left breast unchanged from before.  Abdominal: Soft. Normal appearance. There is no tenderness. No hernia.  Lymphadenopathy:    She has no cervical adenopathy.    She has no axillary adenopathy.  Neurological: She is alert and oriented to person, place, and time.  Skin: Skin is warm and dry.  Lipoma left flank.  Psychiatric: Her behavior is normal.    Data Reviewed Mammogram reviewed and stable.  Assessment    Stable physical exam. History of lung and breast cancer        Plan    Patient will be asked to return to the office in one year with a bilateral screening mammogram.       PCP:  Juluis Pitch This information has been scribed by Karie Fetch RN, BSN,BC.   SANKAR,SEEPLAPUTHUR G 09/02/2015, 11:49 AM

## 2015-09-10 DIAGNOSIS — Z8719 Personal history of other diseases of the digestive system: Secondary | ICD-10-CM | POA: Diagnosis not present

## 2015-09-10 DIAGNOSIS — Z800 Family history of malignant neoplasm of digestive organs: Secondary | ICD-10-CM | POA: Diagnosis not present

## 2015-09-10 DIAGNOSIS — K219 Gastro-esophageal reflux disease without esophagitis: Secondary | ICD-10-CM | POA: Diagnosis not present

## 2015-09-10 DIAGNOSIS — Z86010 Personal history of colonic polyps: Secondary | ICD-10-CM | POA: Diagnosis not present

## 2015-10-01 DIAGNOSIS — E782 Mixed hyperlipidemia: Secondary | ICD-10-CM | POA: Diagnosis not present

## 2015-10-01 DIAGNOSIS — E119 Type 2 diabetes mellitus without complications: Secondary | ICD-10-CM | POA: Diagnosis not present

## 2015-10-01 DIAGNOSIS — Z794 Long term (current) use of insulin: Secondary | ICD-10-CM | POA: Diagnosis not present

## 2015-10-01 DIAGNOSIS — I10 Essential (primary) hypertension: Secondary | ICD-10-CM | POA: Diagnosis not present

## 2015-10-27 ENCOUNTER — Encounter: Payer: Self-pay | Admitting: *Deleted

## 2015-10-28 ENCOUNTER — Encounter: Payer: Self-pay | Admitting: *Deleted

## 2015-10-28 ENCOUNTER — Ambulatory Visit: Payer: PPO | Admitting: Anesthesiology

## 2015-10-28 ENCOUNTER — Ambulatory Visit
Admission: RE | Admit: 2015-10-28 | Discharge: 2015-10-28 | Disposition: A | Discharge status: Home / Self Care | Payer: PPO | Source: Ambulatory Visit | Attending: Unknown Physician Specialty | Admitting: Unknown Physician Specialty

## 2015-10-28 ENCOUNTER — Encounter
Admission: RE | Disposition: A | Discharge status: Home / Self Care | Payer: Self-pay | Source: Ambulatory Visit | Attending: Unknown Physician Specialty

## 2015-10-28 DIAGNOSIS — Z86010 Personal history of colonic polyps: Secondary | ICD-10-CM | POA: Insufficient documentation

## 2015-10-28 DIAGNOSIS — M1990 Unspecified osteoarthritis, unspecified site: Secondary | ICD-10-CM | POA: Insufficient documentation

## 2015-10-28 DIAGNOSIS — Z87891 Personal history of nicotine dependence: Secondary | ICD-10-CM | POA: Insufficient documentation

## 2015-10-28 DIAGNOSIS — Z91010 Allergy to peanuts: Secondary | ICD-10-CM | POA: Insufficient documentation

## 2015-10-28 DIAGNOSIS — K449 Diaphragmatic hernia without obstruction or gangrene: Secondary | ICD-10-CM | POA: Diagnosis not present

## 2015-10-28 DIAGNOSIS — Z85118 Personal history of other malignant neoplasm of bronchus and lung: Secondary | ICD-10-CM | POA: Insufficient documentation

## 2015-10-28 DIAGNOSIS — K648 Other hemorrhoids: Secondary | ICD-10-CM | POA: Insufficient documentation

## 2015-10-28 DIAGNOSIS — Z7984 Long term (current) use of oral hypoglycemic drugs: Secondary | ICD-10-CM | POA: Insufficient documentation

## 2015-10-28 DIAGNOSIS — Z888 Allergy status to other drugs, medicaments and biological substances status: Secondary | ICD-10-CM | POA: Insufficient documentation

## 2015-10-28 DIAGNOSIS — K31819 Angiodysplasia of stomach and duodenum without bleeding: Secondary | ICD-10-CM | POA: Diagnosis not present

## 2015-10-28 DIAGNOSIS — I8390 Asymptomatic varicose veins of unspecified lower extremity: Secondary | ICD-10-CM | POA: Diagnosis not present

## 2015-10-28 DIAGNOSIS — Z853 Personal history of malignant neoplasm of breast: Secondary | ICD-10-CM | POA: Insufficient documentation

## 2015-10-28 DIAGNOSIS — I739 Peripheral vascular disease, unspecified: Secondary | ICD-10-CM | POA: Insufficient documentation

## 2015-10-28 DIAGNOSIS — E669 Obesity, unspecified: Secondary | ICD-10-CM | POA: Insufficient documentation

## 2015-10-28 DIAGNOSIS — K760 Fatty (change of) liver, not elsewhere classified: Secondary | ICD-10-CM | POA: Insufficient documentation

## 2015-10-28 DIAGNOSIS — D123 Benign neoplasm of transverse colon: Secondary | ICD-10-CM | POA: Insufficient documentation

## 2015-10-28 DIAGNOSIS — Q2733 Arteriovenous malformation of digestive system vessel: Secondary | ICD-10-CM | POA: Diagnosis not present

## 2015-10-28 DIAGNOSIS — K635 Polyp of colon: Secondary | ICD-10-CM | POA: Diagnosis not present

## 2015-10-28 DIAGNOSIS — J439 Emphysema, unspecified: Secondary | ICD-10-CM | POA: Diagnosis not present

## 2015-10-28 DIAGNOSIS — Z6841 Body Mass Index (BMI) 40.0 and over, adult: Secondary | ICD-10-CM | POA: Diagnosis not present

## 2015-10-28 DIAGNOSIS — Z90710 Acquired absence of both cervix and uterus: Secondary | ICD-10-CM | POA: Diagnosis not present

## 2015-10-28 DIAGNOSIS — E119 Type 2 diabetes mellitus without complications: Secondary | ICD-10-CM | POA: Diagnosis not present

## 2015-10-28 DIAGNOSIS — K5790 Diverticulosis of intestine, part unspecified, without perforation or abscess without bleeding: Secondary | ICD-10-CM | POA: Diagnosis not present

## 2015-10-28 DIAGNOSIS — E785 Hyperlipidemia, unspecified: Secondary | ICD-10-CM | POA: Insufficient documentation

## 2015-10-28 DIAGNOSIS — Z85820 Personal history of malignant melanoma of skin: Secondary | ICD-10-CM | POA: Diagnosis not present

## 2015-10-28 DIAGNOSIS — Z8711 Personal history of peptic ulcer disease: Secondary | ICD-10-CM | POA: Insufficient documentation

## 2015-10-28 DIAGNOSIS — Z7982 Long term (current) use of aspirin: Secondary | ICD-10-CM | POA: Insufficient documentation

## 2015-10-28 DIAGNOSIS — I10 Essential (primary) hypertension: Secondary | ICD-10-CM | POA: Diagnosis not present

## 2015-10-28 DIAGNOSIS — Z1211 Encounter for screening for malignant neoplasm of colon: Secondary | ICD-10-CM | POA: Insufficient documentation

## 2015-10-28 DIAGNOSIS — K219 Gastro-esophageal reflux disease without esophagitis: Secondary | ICD-10-CM | POA: Insufficient documentation

## 2015-10-28 DIAGNOSIS — Z79899 Other long term (current) drug therapy: Secondary | ICD-10-CM | POA: Insufficient documentation

## 2015-10-28 DIAGNOSIS — L309 Dermatitis, unspecified: Secondary | ICD-10-CM | POA: Diagnosis not present

## 2015-10-28 DIAGNOSIS — K5730 Diverticulosis of large intestine without perforation or abscess without bleeding: Secondary | ICD-10-CM | POA: Diagnosis not present

## 2015-10-28 DIAGNOSIS — G4730 Sleep apnea, unspecified: Secondary | ICD-10-CM | POA: Diagnosis not present

## 2015-10-28 DIAGNOSIS — F329 Major depressive disorder, single episode, unspecified: Secondary | ICD-10-CM | POA: Diagnosis not present

## 2015-10-28 DIAGNOSIS — K640 First degree hemorrhoids: Secondary | ICD-10-CM | POA: Diagnosis not present

## 2015-10-28 DIAGNOSIS — Z91048 Other nonmedicinal substance allergy status: Secondary | ICD-10-CM | POA: Insufficient documentation

## 2015-10-28 HISTORY — DX: Gastro-esophageal reflux disease with esophagitis, without bleeding: K21.00

## 2015-10-28 HISTORY — DX: Gastro-esophageal reflux disease without esophagitis: K21.9

## 2015-10-28 HISTORY — PX: COLONOSCOPY WITH PROPOFOL: SHX5780

## 2015-10-28 HISTORY — DX: Hyperlipidemia, unspecified: E78.5

## 2015-10-28 HISTORY — DX: Depression, unspecified: F32.A

## 2015-10-28 HISTORY — DX: Personal history of other diseases of the digestive system: Z87.19

## 2015-10-28 HISTORY — DX: Major depressive disorder, single episode, unspecified: F32.9

## 2015-10-28 HISTORY — PX: ESOPHAGOGASTRODUODENOSCOPY (EGD) WITH PROPOFOL: SHX5813

## 2015-10-28 HISTORY — DX: Malignant neoplasm of unspecified part of unspecified bronchus or lung: C34.90

## 2015-10-28 HISTORY — DX: Fatty (change of) liver, not elsewhere classified: K76.0

## 2015-10-28 HISTORY — DX: Dermatitis, unspecified: L30.9

## 2015-10-28 HISTORY — DX: Gastro-esophageal reflux disease with esophagitis: K21.0

## 2015-10-28 LAB — GLUCOSE, CAPILLARY: Glucose-Capillary: 121 mg/dL — ABNORMAL HIGH (ref 65–99)

## 2015-10-28 SURGERY — COLONOSCOPY WITH PROPOFOL
Anesthesia: General

## 2015-10-28 MED ORDER — PHENYLEPHRINE HCL 10 MG/ML IJ SOLN
INTRAMUSCULAR | Status: DC | PRN
  Administered 2015-10-28: 100 ug via INTRAVENOUS

## 2015-10-28 MED ORDER — EPHEDRINE SULFATE 50 MG/ML IJ SOLN
INTRAMUSCULAR | Status: DC | PRN
  Administered 2015-10-28 (×2): 10 mg via INTRAVENOUS

## 2015-10-28 MED ORDER — SODIUM CHLORIDE 0.9 % IV SOLN
INTRAVENOUS | Status: DC
  Administered 2015-10-28: 09:00:00 via INTRAVENOUS

## 2015-10-28 MED ORDER — PROPOFOL 500 MG/50ML IV EMUL
INTRAVENOUS | Status: DC | PRN
  Administered 2015-10-28: 120 ug/kg/min via INTRAVENOUS

## 2015-10-28 MED ORDER — PROPOFOL 10 MG/ML IV BOLUS
INTRAVENOUS | Status: DC | PRN
  Administered 2015-10-28: 20 mg via INTRAVENOUS

## 2015-10-28 MED ORDER — MIDAZOLAM HCL 2 MG/2ML IJ SOLN
INTRAMUSCULAR | Status: DC | PRN
  Administered 2015-10-28: 1 mg via INTRAVENOUS

## 2015-10-28 NOTE — Anesthesia Postprocedure Evaluation (Signed)
Anesthesia Post Note  Patient: Kathryn Hammond  Procedure(s) Performed: Procedure(s) (LRB): COLONOSCOPY WITH PROPOFOL (N/A) ESOPHAGOGASTRODUODENOSCOPY (EGD) WITH PROPOFOL (N/A)  Anesthesia Post Evaluation  Last Vitals:  Filed Vitals:   10/28/15 0830  BP: 159/84  Pulse: 73  Temp: 36.2 C  Resp: 16    Last Pain: There were no vitals filed for this visit.               VAN STAVEREN,Crista Nuon

## 2015-10-28 NOTE — Anesthesia Postprocedure Evaluation (Signed)
Anesthesia Post Note  Patient: Kathryn Hammond  Procedure(s) Performed: Procedure(s) (LRB): COLONOSCOPY WITH PROPOFOL (N/A) ESOPHAGOGASTRODUODENOSCOPY (EGD) WITH PROPOFOL (N/A)  Patient location during evaluation: PACU Anesthesia Type: General Level of consciousness: awake Pain management: pain level controlled Vital Signs Assessment: post-procedure vital signs reviewed and stable Respiratory status: spontaneous breathing Cardiovascular status: stable Anesthetic complications: no    Last Vitals:  Filed Vitals:   10/28/15 0830  BP: 159/84  Pulse: 73  Temp: 36.2 C  Resp: 16    Last Pain: There were no vitals filed for this visit.               VAN STAVEREN,Charod Slawinski

## 2015-10-28 NOTE — Op Note (Signed)
[facility] Gastroenterology Patient Name: Kathryn Hammond Procedure Date: 10/28/2015 9:12 AM MRN: [id number] Account #: [id number] Date of Birth: 03-25-1941 Admit Type: Outpatient Age: 75 Room: [facility] ENDO ROOM 1 Gender: Female Note Status: Finalized Procedure:            Upper GI endoscopy Indications:          Heartburn, Follow-up of gastro-esophageal reflux disease Providers:            Manya Silvas, MD Referring MD:         Youlanda Roys. Lovie Macadamia, MD (Referring MD) Medicines:            Propofol per Anesthesia Complications:        No immediate complications. Procedure:            Pre-Anesthesia Assessment:                       - After reviewing the risks and benefits, the patient                        was deemed in satisfactory condition to undergo the                        procedure.                       After obtaining informed consent, the endoscope was                        passed under direct vision. Throughout the procedure,                        the patient's blood pressure, pulse, and oxygen                        saturations were monitored continuously. The Endoscope                        was introduced through the mouth, and advanced to the                        second part of duodenum. The upper GI endoscopy was                        accomplished without difficulty. The patient tolerated                        the procedure well. Findings:      A medium-sized hiatal hernia was present. Lumen looked like previoius       surgery. GEJ 33cm. AVM? at gastro esophageal junction.      A single small no bleeding angioectasia was found in the gastric fundus.       Coagulation for tissue destruction using argon plasma at 0.8       liters/minute and 30 watts was successful. Impression:           - Medium-sized hiatal hernia.                       - A single non-bleeding angioectasia in the stomach.  Treated with argon plasma  coagulation (APC).                       - No specimens collected. Recommendation:       - Perform a colonoscopy as previously scheduled. Manya Silvas, MD 10/28/2015 9:36:17 AM This report has been signed electronically. Number of Addenda: 0 Note Initiated On: 10/28/2015 9:12 AM      [facility]

## 2015-10-28 NOTE — Transfer of Care (Signed)
Immediate Anesthesia Transfer of Care Note  Patient: Kathryn Hammond  Procedure(s) Performed: Procedure(s): COLONOSCOPY WITH PROPOFOL (N/A) ESOPHAGOGASTRODUODENOSCOPY (EGD) WITH PROPOFOL (N/A)  Patient Location: PACU  Anesthesia Type:General  Level of Consciousness: awake  Airway & Oxygen Therapy: Patient Spontanous Breathing and Patient connected to nasal cannula oxygen  Post-op Assessment: Report given to RN  Post vital signs: Reviewed  Last Vitals:  Filed Vitals:   10/28/15 0830  BP: 159/84  Pulse: 73  Temp: 36.2 C  Resp: 16    Last Pain: There were no vitals filed for this visit.       Complications: No apparent anesthesia complications

## 2015-10-28 NOTE — Anesthesia Preprocedure Evaluation (Signed)
Anesthesia Evaluation  Patient identified by MRN, date of birth, ID band Patient awake    Reviewed: Allergy & Precautions, NPO status , Patient's Chart, lab work & pertinent test results  Airway Mallampati: III       Dental  (+) Teeth Intact   Pulmonary sleep apnea and Continuous Positive Airway Pressure Ventilation , COPD, former smoker,     + decreased breath sounds      Cardiovascular hypertension, Pt. on home beta blockers + Peripheral Vascular Disease   Rhythm:Regular     Neuro/Psych Depression    GI/Hepatic Neg liver ROS, hiatal hernia, GERD  Medicated,  Endo/Other  diabetes, Type 2, Oral Hypoglycemic AgentsMorbid obesity  Renal/GU      Musculoskeletal   Abdominal (+) + obese,   Peds  Hematology   Anesthesia Other Findings   Reproductive/Obstetrics                             Anesthesia Physical Anesthesia Plan  ASA: III  Anesthesia Plan: General   Post-op Pain Management:    Induction: Intravenous  Airway Management Planned: Natural Airway and Nasal Cannula  Additional Equipment:   Intra-op Plan:   Post-operative Plan:   Informed Consent: I have reviewed the patients History and Physical, chart, labs and discussed the procedure including the risks, benefits and alternatives for the proposed anesthesia with the patient or authorized representative who has indicated his/her understanding and acceptance.     Plan Discussed with:   Anesthesia Plan Comments:         Anesthesia Quick Evaluation

## 2015-10-28 NOTE — H&P (Addendum)
Primary Care Physician:  Juluis Pitch, MD Primary Gastroenterologist:  Dr. Vira Agar  Pre-Procedure History & Physical: HPI:  Kathryn Hammond is a 75 y.o. female is here for an endoscopy and colonoscopy.   Past Medical History  Diagnosis Date  . Seasonal allergies   . Emphysema   . Unspecified essential hypertension   . Personal history of malignant neoplasm of bronchus and lung     right lung cancer  . Personal history of tobacco use, presenting hazards to health   . Asthma   . Ulcer     peptic  . Sleep apnea   . Arthritis   . Personal history of malignant neoplasm of breast     left breast DCIS  . Diabetes ([place])     non-insulin dependent  . Obesity, unspecified   . Varicose veins of lower extremities with other complications   . Screening for obesity   . CPAP (continuous positive airway pressure) dependence 15 years  . Depression   . Dermatitis   . Fatty liver disease, nonalcoholic   . GERD (gastroesophageal reflux disease)   . History of hiatal hernia   . Melanoma ([place])     right temple  . Lung cancer ([place])   . Hyperlipidemia   . Reflux esophagitis     Past Surgical History  Procedure Laterality Date  . Knee surgery  1999    knee replaced  . Knee surgery  1977    knees replaced  . Salpingoophorectomy  1972  . Abdominal hysterectomy  1972  . Lobectomy  1989  . Hernia repair  1990  . Eye surgery  1992  . Colonoscopy  2007 and 2012    Dr. Vira Agar at [facility]; polyps  . 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 Melanoma excision  December 07, 2009    right temple  . Breast surgery Left 2010    mammosite balloon placement and removal  . Breast lumpectomy Left 2010    R for DCIS  . Breast biopsy    . Mohs surgery  2016    [facility], nose    Prior to Admission medications   Medication Sig Start Date End Date Taking? Authorizing Provider  amLODipine (NORVASC) 5 MG tablet Take 1 tablet by mouth daily.  07/30/13  Yes Historical Provider, MD  aspirin 81 MG tablet Take 81 mg by mouth daily.   Yes Historical Provider, MD  BIOTIN PO Take 2 tablets by mouth daily.   Yes Historical Provider, MD  cetirizine (ZYRTEC) 10 MG tablet Take 10 mg by mouth daily.   Yes Historical Provider, MD  Cholecalciferol (DIALYVITE VITAMIN D 5000 PO) Take 1 tablet by mouth daily.    Yes Historical Provider, MD  clonazePAM (KLONOPIN) 0.5 MG tablet Take 0.5 mg by mouth 2 (two) times daily as needed for anxiety.   Yes Historical Provider, MD  fexofenadine (ALLEGRA) 60 MG tablet Take 60 mg by mouth 2 (two) times daily.   Yes Historical Provider, MD  fluticasone (FLONASE) 50 MCG/ACT nasal spray Place 2 sprays into both nostrils daily.   Yes Historical Provider, MD  ipratropium (ATROVENT HFA) 17 MCG/ACT inhaler Inhale 2 puffs into the lungs every 6 (six) hours.   Yes Historical Provider, MD  METOPROLOL TARTRATE PO Take 100 mg by mouth daily.    Yes Historical Provider, MD  Acetaminophen (TYLENOL 8 HOUR PO) Take by mouth daily.  Historical Provider, MD  atorvastatin (LIPITOR) 40 MG tablet Take 1 tablet by mouth daily. 06/30/13   Historical Provider, MD  hydrochlorothiazide (HYDRODIURIL) 12.5 MG tablet Take 1 tablet by mouth daily. 07/14/13   Historical Provider, MD  losartan (COZAAR) 100 MG tablet Take 1 tablet by mouth daily at 6 (six) AM. 06/11/13   Historical Provider, MD  metFORMIN (GLUCOPHAGE-XR) 750 MG 24 hr tablet Take 750 mg by mouth. 06/22/14   Historical Provider, MD  omeprazole (PRILOSEC) 20 MG capsule Take 20 mg by mouth daily as needed.     Historical Provider, MD    Allergies as of 09/30/2015 - Review Complete 09/02/2015  Allergen Reaction Noted  . Lisinopril Swelling 08/13/2013  . Peanut-containing drug products  01/26/2015  . Tape Itching 06/01/2012  . Tegaderm ag mesh [silver] Other (See Comments) 06/01/2012  . Zantac [ranitidine hcl] Hives 06/01/2012    Family History  Problem Relation Age of Onset  .  Ovarian cancer Mother     Social History   Social History  . Marital Status: Married    Spouse Name: N/A  . Number of Children: N/A  . Years of Education: N/A   Occupational History  . Not on file.   Social History Main Topics  . Smoking status: Former Smoker -- 30 years    Quit date: 04/10/1988  . Smokeless tobacco: Never Used  . Alcohol Use: 0.0 oz/week    0 Standard drinks or equivalent per week     Comment: once in a while  . Drug Use: No  . Sexual Activity: Not on file   Other Topics Concern  . Not on file   Social History Narrative    Review of Systems: See HPI, otherwise negative ROS  Physical Exam: BP 159/84 mmHg  Pulse 73  Temp(Src) 97.2 F (36.2 C) (Tympanic)  Resp 16  Ht 5' (1.524 m)  Wt 95.255 kg (210 lb)  BMI 41.01 kg/m2  SpO2 96% General:   Alert,  pleasant and cooperative in NAD Head:  Normocephalic and atraumatic. Neck:  Supple; no masses or thyromegaly. Lungs:  Clear throughout to auscultation.    Heart:  Regular rate and rhythm. Abdomen:  Soft, nontender and nondistended. Normal bowel sounds, without guarding, and without rebound.   Neurologic:  Alert and  oriented x4;  grossly normal neurologically.  Impression/Plan: Kathryn Hammond is here for an endoscopy and colonoscopy to be performed for [facility] colon polyps, GERD, worsening  Risks, benefits, limitations, and alternatives regarding  endoscopy and colonoscopy have been reviewed with the patient.  Questions have been answered.  All parties agreeable.   Gaylyn Cheers, MD  10/28/2015, 9:10 AM

## 2015-10-28 NOTE — Op Note (Signed)
[facility] Gastroenterology Patient Name: Kathryn Hammond Procedure Date: 10/28/2015 9:11 AM MRN: [id number] Account #: [id number] Date of Birth: 1940/11/25 Admit Type: Outpatient Age: 75 Room: [facility] ENDO ROOM 1 Gender: Female Note Status: Finalized Procedure:            Colonoscopy Indications:          High risk colon cancer surveillance: Personal history                        of colonic polyps Providers:            Manya Silvas, MD Referring MD:         Youlanda Roys. Lovie Macadamia, MD (Referring MD) Medicines:            Propofol per Anesthesia Complications:        No immediate complications. Procedure:            Pre-Anesthesia Assessment:                       - After reviewing the risks and benefits, the patient                        was deemed in satisfactory condition to undergo the                        procedure.                       After obtaining informed consent, the colonoscope was                        passed under direct vision. Throughout the procedure,                        the patient's blood pressure, pulse, and oxygen                        saturations were monitored continuously. The                        Colonoscope was introduced through the anus and                        advanced to the the cecum, identified by appendiceal                        orifice and ileocecal valve. The colonoscopy was                        performed without difficulty. The patient tolerated the                        procedure well. The quality of the bowel preparation                        was good. Findings:      A small polyp was found in the hepatic flexure. The polyp was sessile.       The polyp was removed with a hot snare. Resection and retrieval were       complete.      A small polyp was found  in the proximal transverse colon. The polyp was       sessile. The polyp was removed with a hot snare. Resection and retrieval       were complete.   Internal hemorrhoids were found during endoscopy. The hemorrhoids were       small and Grade I (internal hemorrhoids that do not prolapse).      The exam was otherwise without abnormality.      A few small-mouthed diverticula were found in the sigmoid colon. Impression:           - One small polyp at the hepatic flexure, removed with                        a hot snare. Resected and retrieved.                       - One small polyp in the proximal transverse colon,                        removed with a hot snare. Resected and retrieved.                       - Internal hemorrhoids.                       - The examination was otherwise normal. Recommendation:       - Await pathology results. Manya Silvas, MD 10/28/2015 9:57:35 AM This report has been signed electronically. Number of Addenda: 0 Note Initiated On: 10/28/2015 9:11 AM Scope Withdrawal Time: 0 hours 13 minutes 9 seconds  Total Procedure Duration: 0 hours 17 minutes 10 seconds       [facility]

## 2015-10-29 ENCOUNTER — Encounter: Payer: Self-pay | Admitting: Unknown Physician Specialty

## 2015-11-01 LAB — SURGICAL PATHOLOGY

## 2015-11-13 DIAGNOSIS — L509 Urticaria, unspecified: Secondary | ICD-10-CM | POA: Diagnosis not present

## 2015-11-15 DIAGNOSIS — L501 Idiopathic urticaria: Secondary | ICD-10-CM | POA: Diagnosis not present

## 2015-11-18 DIAGNOSIS — F419 Anxiety disorder, unspecified: Secondary | ICD-10-CM | POA: Diagnosis not present

## 2015-11-18 DIAGNOSIS — L298 Other pruritus: Secondary | ICD-10-CM | POA: Diagnosis not present

## 2015-11-19 DIAGNOSIS — L509 Urticaria, unspecified: Secondary | ICD-10-CM | POA: Diagnosis not present

## 2015-11-22 ENCOUNTER — Encounter: Payer: Self-pay | Admitting: Emergency Medicine

## 2015-11-22 ENCOUNTER — Emergency Department
Admission: EM | Admit: 2015-11-22 | Discharge: 2015-11-22 | Disposition: A | Discharge status: Home / Self Care | Payer: PPO | Attending: Emergency Medicine | Admitting: Emergency Medicine

## 2015-11-22 DIAGNOSIS — R509 Fever, unspecified: Secondary | ICD-10-CM | POA: Diagnosis not present

## 2015-11-22 DIAGNOSIS — Z7984 Long term (current) use of oral hypoglycemic drugs: Secondary | ICD-10-CM | POA: Insufficient documentation

## 2015-11-22 DIAGNOSIS — E119 Type 2 diabetes mellitus without complications: Secondary | ICD-10-CM | POA: Diagnosis not present

## 2015-11-22 DIAGNOSIS — J45909 Unspecified asthma, uncomplicated: Secondary | ICD-10-CM | POA: Diagnosis not present

## 2015-11-22 DIAGNOSIS — Z87891 Personal history of nicotine dependence: Secondary | ICD-10-CM | POA: Diagnosis not present

## 2015-11-22 LAB — URINALYSIS COMPLETE WITH MICROSCOPIC (ARMC ONLY)
BACTERIA UA: NONE SEEN
BILIRUBIN URINE: NEGATIVE
GLUCOSE, UA: NEGATIVE mg/dL
HGB URINE DIPSTICK: NEGATIVE
Ketones, ur: NEGATIVE mg/dL
NITRITE: NEGATIVE
Protein, ur: NEGATIVE mg/dL
RBC / HPF: NONE SEEN RBC/hpf (ref 0–5)
Specific Gravity, Urine: 1.009 (ref 1.005–1.030)
Squamous Epithelial / LPF: NONE SEEN
pH: 5 (ref 5.0–8.0)

## 2015-11-22 LAB — TSH: TSH: 1.342 u[IU]/mL (ref 0.350–4.500)

## 2015-11-22 LAB — CBC WITH DIFFERENTIAL/PLATELET
BASOS PCT: 1 %
Basophils Absolute: 0.1 10*3/uL (ref 0–0.1)
EOS ABS: 0.1 10*3/uL (ref 0–0.7)
Eosinophils Relative: 1 %
HEMATOCRIT: 41.8 % (ref 35.0–47.0)
Hemoglobin: 14.3 g/dL (ref 12.0–16.0)
Lymphocytes Relative: 25 %
Lymphs Abs: 1.5 10*3/uL (ref 1.0–3.6)
MCH: 29.4 pg (ref 26.0–34.0)
MCHC: 34.1 g/dL (ref 32.0–36.0)
MCV: 86.2 fL (ref 80.0–100.0)
MONO ABS: 0.8 10*3/uL (ref 0.2–0.9)
MONOS PCT: 13 %
Neutro Abs: 3.6 10*3/uL (ref 1.4–6.5)
Neutrophils Relative %: 60 %
Platelets: 171 10*3/uL (ref 150–440)
RBC: 4.85 MIL/uL (ref 3.80–5.20)
RDW: 13.8 % (ref 11.5–14.5)
WBC: 6 10*3/uL (ref 3.6–11.0)

## 2015-11-22 LAB — COMPREHENSIVE METABOLIC PANEL
ALBUMIN: 3.7 g/dL (ref 3.5–5.0)
ALT: 37 U/L (ref 14–54)
ANION GAP: 7 (ref 5–15)
AST: 39 U/L (ref 15–41)
Alkaline Phosphatase: 89 U/L (ref 38–126)
BILIRUBIN TOTAL: 0.6 mg/dL (ref 0.3–1.2)
BUN: 17 mg/dL (ref 6–20)
CALCIUM: 8.9 mg/dL (ref 8.9–10.3)
CO2: 30 mmol/L (ref 22–32)
CREATININE: 0.76 mg/dL (ref 0.44–1.00)
Chloride: 102 mmol/L (ref 101–111)
GFR calc non Af Amer: >60 mL/min (ref >60)
GLUCOSE: 158 mg/dL — AB (ref 65–99)
Potassium: 3.3 mmol/L — ABNORMAL LOW (ref 3.5–5.1)
SODIUM: 139 mmol/L (ref 135–145)
TOTAL PROTEIN: 6.6 g/dL (ref 6.5–8.1)

## 2015-11-22 LAB — TROPONIN I: Troponin I: <0.03 ng/mL (ref <0.03)
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
## 2015-11-22 NOTE — ED Provider Notes (Signed)
Time Seen: Approximately 1402  I have reviewed the triage notes  Chief Complaint: Hot Flashes   History of Present Illness: Kathryn Hammond is a 75 y.o. female who presents with feelings of hot flashes all over her body for the past week. She denies any chest or abdominal pain. She is not aware of any fever. She states she seems to notice it more in the morning. She denies any chest pain, shortness of breath, obvious fever. She denies any cough, dysuria or hematuria. She states she has seen her primary physician and was prescribed Ativan for what seems to be possible anxiety or allergic reaction. She denies any other new medications other than Ativan.   Past Medical History:  Diagnosis Date  . Arthritis   . Asthma   . CPAP (continuous positive airway pressure) dependence 15 years  . Depression   . Dermatitis   . Diabetes ([place])    non-insulin dependent  . Emphysema   . Fatty liver disease, nonalcoholic   . GERD (gastroesophageal reflux disease)   . History of hiatal hernia   . Hyperlipidemia   . Lung cancer ([place])   . Melanoma ([place])    right temple  . Obesity, unspecified   . Personal history of malignant neoplasm of breast    left breast DCIS  . Personal history of malignant neoplasm of bronchus and lung    right lung cancer  . Personal history of tobacco use, presenting hazards to health   . Reflux esophagitis   . Screening for obesity   . Seasonal allergies   . Sleep apnea   . Ulcer    peptic  . Unspecified essential hypertension   . Varicose veins of lower extremities with other complications     Patient Active Problem List   Diagnosis Date Noted  . History of breast cancer 08/15/2012  . History of lung cancer 08/15/2012    Past Surgical History:  Procedure Laterality Date  . ABDOMINAL HYSTERECTOMY  1972  . BREAST BIOPSY    . BREAST LUMPECTOMY Left 2010   R for DCIS  . BREAST SURGERY Left 2010   mammosite balloon placement and removal  . COLONOSCOPY   2007 and 2012   Dr. Vira Agar at [facility]; polyps  . COLONOSCOPY WITH PROPOFOL N/A 10/28/2015   Procedure: COLONOSCOPY WITH PROPOFOL;  Surgeon: Manya Silvas, MD;  Location: [facility] ENDOSCOPY;  Service: Endoscopy;  Laterality: N/A;  . ESOPHAGOGASTRODUODENOSCOPY (EGD) WITH PROPOFOL N/A 10/28/2015   Procedure: ESOPHAGOGASTRODUODENOSCOPY (EGD) WITH PROPOFOL;  Surgeon: Manya Silvas, MD;  Location: [facility] ENDOSCOPY;  Service: Endoscopy;  Laterality: N/A;  . EYE SURGERY  1992  . HERNIA REPAIR  1990  . KNEE SURGERY  1999   knee replaced  . KNEE SURGERY  1977   knees replaced  . LOBECTOMY  1989  . MELANOMA EXCISION  December 07, 2009   right temple  . MOHS SURGERY  2016   [facility], nose  . SALPINGOOPHORECTOMY  1972  . VEIN SURGERY  September 2011   vein closure procedure left leg; RF ablation of GSV in both legs    Past Surgical History:  Procedure Laterality Date  . ABDOMINAL HYSTERECTOMY  1972  . BREAST BIOPSY    . BREAST LUMPECTOMY Left 2010   R for DCIS  . BREAST SURGERY Left 2010   mammosite balloon placement and removal  . COLONOSCOPY  2007 and 2012   [facility]ar at [facility]; polyps  . COLONOSCOPY WITH PROPOFOL N/A 10/28/2015  Procedure: COLONOSCOPY WITH PROPOFOL;  Surgeon: Manya Silvas, MD;  Location: [facility] ENDOSCOPY;  Service: Endoscopy;  Laterality: N/A;  . ESOPHAGOGASTRODUODENOSCOPY (EGD) WITH PROPOFOL N/A 10/28/2015   Procedure: ESOPHAGOGASTRODUODENOSCOPY (EGD) WITH PROPOFOL;  Surgeon: Manya Silvas, MD;  Location: [facility] ENDOSCOPY;  Service: Endoscopy;  Laterality: N/A;  . EYE SURGERY  1992  . HERNIA REPAIR  1990  . KNEE SURGERY  1999   knee replaced  . KNEE SURGERY  1977   knees replaced  . LOBECTOMY  1989  . MELANOMA EXCISION  December 07, 2009   right temple  . MOHS SURGERY  2016   [facility], nose  . SALPINGOOPHORECTOMY  1972  . VEIN SURGERY  September 2011   vein closure procedure left leg; RF ablation of GSV in both legs    Current Outpatient Rx  . Order #: [id number] Class:  Historical Med  . Order #: [id number] Class: Historical Med  . Order #: [id number] Class: Historical Med  . Order #: [id number] Class: Historical Med  . Order #: [id number] Class: Historical Med  . Order #: [id number] Class: Historical Med  . Order #: [id number] Class: Historical Med  . Order #: [id number] Class: Historical Med  . Order #: [id number] Class: Historical Med  . Order #: [id number] Class: Historical Med  . Order #: [id number] Class: Historical Med  . Order #: [id number] Class: Historical Med  . Order #: [id number] Class: Historical Med  . Order #: [id number] Class: Historical Med  . Order #: [id number] Class: Historical Med  . Order #: [id number] Class: Historical Med    Allergies:  Balsam peru-castor [amberderm]; Carafate [sucralfate]; Dmdm hydantoin; Lisinopril; Peanut-containing drug products; Pepcid [famotidine]; Silver; Tape; Xanax [alprazolam]; and Zantac [ranitidine hcl]  Family History: Family History  Problem Relation Age of Onset  . Ovarian cancer Mother     Social History: Social History  Substance Use Topics  . Smoking status: Former Smoker    Years: 30.00    Quit date: 04/10/1988  . Smokeless tobacco: Never Used  . Alcohol use 0.0 oz/week     Comment: once in a while     Review of Systems:   10 point review of systems was performed and was otherwise negative:  Constitutional: No fever Eyes: No visual disturbances ENT: No sore throat, ear pain Cardiac: No chest pain Respiratory: No shortness of breath, wheezing, or stridor Abdomen: No abdominal pain, no vomiting, No diarrhea Endocrine: No weight loss, No night sweats Extremities: No peripheral edema, cyanosis Skin: No rashes, easy bruising Neurologic: No focal weakness, trouble with speech or swollowing Urologic: No dysuria, Hematuria, or urinary frequency   Physical Exam:  ED Triage Vitals  Enc Vitals Group     BP 11/22/15 1116 (!) 160/100     Pulse Rate 11/22/15 1116 67     Resp 11/22/15 1116 20     Temp 11/22/15  1116 98.2 F (36.8 C)     Temp Source 11/22/15 1116 Oral     SpO2 11/22/15 1116 94 %     Weight 11/22/15 1117 209 lb (94.8 kg)     Height 11/22/15 1117 5' (1.524 m)     Head Circumference --      Peak Flow --      Pain Score 11/22/15 1117 0     Pain Loc --      Pain Edu? --      Excl. in [place]? --     General: Awake , Alert , and Oriented times 3; GCS 15 Head: Normal cephalic , atraumatic Eyes: Pupils equal , round, reactive to light  Nose/Throat: No nasal drainage, patent upper airway without erythema or exudate.  Neck: Supple, Full range of motion, No anterior adenopathy or palpable thyroid masses Lungs: Clear to ascultation without wheezes , rhonchi, or rales Heart: Regular rate, regular rhythm without murmurs , gallops , or rubs Abdomen: Soft, non tender without rebound, guarding , or rigidity; bowel sounds positive and symmetric in all 4 quadrants. No organomegaly .        Extremities: 2 plus symmetric pulses. No edema, clubbing or cyanosis Neurologic: normal ambulation, Motor symmetric without deficits, sensory intact Skin: warm, dry, no rashes   Labs:   All laboratory work was reviewed including any pertinent negatives or positives listed below:  Labs Reviewed  COMPREHENSIVE METABOLIC PANEL - Abnormal; Notable for the following:       Result Value   Potassium 3.3 (*)    Glucose, Bld 158 (*)    All other components within normal limits  URINALYSIS COMPLETEWITH MICROSCOPIC ([facility] ONLY) - Abnormal; Notable for the following:    Color, Urine STRAW (*)    APPearance CLEAR (*)    Leukocytes, UA TRACE (*)    All other components within normal limits  CBC WITH DIFFERENTIAL/PLATELET  TROPONIN I  TSH  Laboratory work was reviewed and showed no clinically significant abnormalities.   EKG: *  ED ECG REPORT I, Daymon Larsen, the attending physician, personally viewed and interpreted this ECG.  Date: 11/22/2015 EKG Time: 1124 Rate: 61 Rhythm: normal sinus rhythm QRS  Axis: normal Intervals: normal ST/T Wave abnormalities: Nonspecific ST-T wave abnormality Conduction Disturbances: none Narrative Interpretation: unremarkable No acute ischemic changes  "   ED Course:  Patient's stay here was uneventful and I was not sure of the nature of her feeling flushed, etc. her first thing in the morning. I advised her to take her temperature make sure that she does not have a fever at home and that would help narrowed down this possible sources for her symptoms. [place] work including her thyroid function appears to be normal here in emergency department I did not see any reason to hospitalize the patient or do any other further assessments. She was advised that her workup is not complete and the follow-up again with her primary doctor.  Clinical Course     Assessment:  Fever unspecified   Final Clinical Impression:  Final diagnoses:  Fever, unspecified fever cause     Plan: * Outpatient Patient was advised to return immediately if condition worsens. Patient was advised to follow up with their primary care physician or other specialized physicians involved in their outpatient care. The patient and/or family member/power of attorney had laboratory results reviewed at the bedside. All questions and concerns were addressed and appropriate discharge instructions were distributed by the nursing staff.            Daymon Larsen, MD 11/22/15 1539

## 2015-11-22 NOTE — ED Notes (Signed)
EDP at bedside  

## 2015-11-22 NOTE — Discharge Instructions (Signed)
Please return immediately if condition worsens. Please contact her primary physician or the physician you were given for referral. If you have any specialist physicians involved in her treatment and plan please also contact them. Thank you for using [facility]. ° °

## 2015-11-23 ENCOUNTER — Emergency Department
Admission: EM | Admit: 2015-11-23 | Discharge: 2015-11-23 | Disposition: A | Discharge status: Home / Self Care | Payer: PPO | Attending: Emergency Medicine | Admitting: Emergency Medicine

## 2015-11-23 ENCOUNTER — Encounter: Payer: Self-pay | Admitting: Emergency Medicine

## 2015-11-23 DIAGNOSIS — Z7984 Long term (current) use of oral hypoglycemic drugs: Secondary | ICD-10-CM | POA: Diagnosis not present

## 2015-11-23 DIAGNOSIS — J45909 Unspecified asthma, uncomplicated: Secondary | ICD-10-CM | POA: Diagnosis not present

## 2015-11-23 DIAGNOSIS — E119 Type 2 diabetes mellitus without complications: Secondary | ICD-10-CM | POA: Insufficient documentation

## 2015-11-23 DIAGNOSIS — Z87891 Personal history of nicotine dependence: Secondary | ICD-10-CM | POA: Insufficient documentation

## 2015-11-23 DIAGNOSIS — N951 Menopausal and female climacteric states: Secondary | ICD-10-CM | POA: Insufficient documentation

## 2015-11-23 DIAGNOSIS — Z853 Personal history of malignant neoplasm of breast: Secondary | ICD-10-CM | POA: Diagnosis not present

## 2015-11-23 DIAGNOSIS — Z85118 Personal history of other malignant neoplasm of bronchus and lung: Secondary | ICD-10-CM | POA: Insufficient documentation

## 2015-11-23 DIAGNOSIS — R232 Flushing: Secondary | ICD-10-CM

## 2015-11-23 LAB — BASIC METABOLIC PANEL
Anion gap: 9 (ref 5–15)
BUN: 16 mg/dL (ref 6–20)
CHLORIDE: 102 mmol/L (ref 101–111)
CO2: 30 mmol/L (ref 22–32)
CREATININE: 0.84 mg/dL (ref 0.44–1.00)
Calcium: 9.3 mg/dL (ref 8.9–10.3)
GFR calc non Af Amer: >60 mL/min (ref >60)
GLUCOSE: 174 mg/dL — AB (ref 65–99)
Potassium: 3.2 mmol/L — ABNORMAL LOW (ref 3.5–5.1)
Sodium: 141 mmol/L (ref 135–145)

## 2015-11-23 LAB — CBC
HEMATOCRIT: 43.2 % (ref 35.0–47.0)
HEMOGLOBIN: 14.7 g/dL (ref 12.0–16.0)
MCH: 29.2 pg (ref 26.0–34.0)
MCHC: 34 g/dL (ref 32.0–36.0)
MCV: 85.9 fL (ref 80.0–100.0)
Platelets: 183 10*3/uL (ref 150–440)
RBC: 5.02 MIL/uL (ref 3.80–5.20)
RDW: 14 % (ref 11.5–14.5)
WBC: 5.9 10*3/uL (ref 3.6–11.0)

## 2015-11-23 LAB — TROPONIN I: Troponin I: <0.03 ng/mL (ref <0.03)

## 2015-11-23 NOTE — ED Provider Notes (Signed)
[facility] Emergency Department Provider Note   ____________________________________________   I have reviewed the triage vital signs and the nursing notes.   HISTORY  Chief Complaint Nausea and Hot Flashes   History limited by: Not Limited   HPI Kathryn Hammond is a 75 y.o. female who presents to the emergency department today because of an episode of a hot flash and itchiness that happened this morning. Patient has been having these symptoms for what sounds like a few weeks. Was seen in the emergency department yesterday morning for the same symptoms and had blood work and thyroid levels which were done. She returns today primarily to let us know that they had also found with a thicker bedbugs at her bed and she was not sure if she might of contracted some infectious disease from them.    Past Medical History:  Diagnosis Date  . Arthritis   . Asthma   . CPAP (continuous positive airway pressure) dependence 15 years  . Depression   . Dermatitis   . Diabetes ([place])    non-insulin dependent  . Emphysema   . Fatty liver disease, nonalcoholic   . GERD (gastroesophageal reflux disease)   . History of hiatal hernia   . Hyperlipidemia   . Lung cancer ([place])   . Melanoma ([place])    right temple  . Obesity, unspecified   . Personal history of malignant neoplasm of breast    left breast DCIS  . Personal history of malignant neoplasm of bronchus and lung    right lung cancer  . Personal history of tobacco use, presenting hazards to health   . Reflux esophagitis   . Screening for obesity   . Seasonal allergies   . Sleep apnea   . Ulcer    peptic  . Unspecified essential hypertension   . Varicose veins of lower extremities with other complications     Patient Active Problem List   Diagnosis Date Noted  . History of breast cancer 08/15/2012  . History of lung cancer 08/15/2012    Past Surgical History:  Procedure Laterality Date  . ABDOMINAL  HYSTERECTOMY  1972  . BREAST BIOPSY    . BREAST LUMPECTOMY Left 2010   R for DCIS  . BREAST SURGERY Left 2010   mammosite balloon placement and removal  . COLONOSCOPY  2007 and 2012   [facility]a [facility]ar at [facility]; polyps  . COLONOSCOPY WITH PROPOFOL N/A 10/28/2015   Procedure: COLONOSCOPY WITH PROPOFOL;  Surgeon: Manya Silvas, MD;  [facility] ENDOSCOPY;  Service: Endoscopy;  Laterality: N/A;  . ESOPHAGOGASTRODUODENOSCOPY (EGD) WITH PROPOFOL N/A 10/28/2015   Procedure: ESOPHAGOGASTRODUODENOSCOPY (EGD) WITH PROPOFOL;  Surgeon: Manya Silvas, MD;  Location: [facility] ENDOSCOPY;  Service: Endoscopy;  Laterality: N/A;  . EYE SURGERY  1992  . HERNIA REPAIR  1990  . KNEE SURGERY  1999   knee replaced  . KNEE SURGERY  1977   knees replaced  . LOBECTOMY  1989  . MELANOMA EXCISION  December 07, 2009   right temple  . MOHS SURGERY  2016   [facility], nose  . SALPINGOOPHORECTOMY  1972  . VEIN SURGERY  September 2011   vein closure procedure left leg; RF ablation of GSV in both legs    Prior to Admission medications   Medication Sig Start Date End Date Taking? Authorizing Provider  Acetaminophen (TYLENOL 8 HOUR PO) Take by mouth daily.     Historical Provider, MD  amLODipine (NORVASC) 5 MG tablet Take 1  tablet by mouth daily. 07/30/13   Historical Provider, MD  aspirin 81 MG tablet Take 81 mg by mouth daily.    Historical Provider, MD  atorvastatin (LIPITOR) 40 MG tablet Take 1 tablet by mouth daily. 06/30/13   Historical Provider, MD  BIOTIN PO Take 2 tablets by mouth daily.    Historical Provider, MD  cetirizine (ZYRTEC) 10 MG tablet Take 10 mg by mouth daily.    Historical Provider, MD  Cholecalciferol (DIALYVITE VITAMIN D 5000 PO) Take 1 tablet by mouth daily.     Historical Provider, MD  clonazePAM (KLONOPIN) 0.5 MG tablet Take 0.5 mg by mouth 2 (two) times daily as needed for anxiety.    Historical Provider, MD  fexofenadine (ALLEGRA) 60 MG tablet Take 60 mg by mouth 2 (two) times daily.    Historical  Provider, MD  fluticasone (FLONASE) 50 MCG/ACT nasal spray Place 2 sprays into both nostrils daily.    Historical Provider, MD  hydrochlorothiazide (HYDRODIURIL) 12.5 MG tablet Take 1 tablet by mouth daily. 07/14/13   Historical Provider, MD  ipratropium (ATROVENT HFA) 17 MCG/ACT inhaler Inhale 2 puffs into the lungs every 6 (six) hours.    Historical Provider, MD  losartan (COZAAR) 100 MG tablet Take 1 tablet by mouth daily at 6 (six) AM. 06/11/13   Historical Provider, MD  metFORMIN (GLUCOPHAGE-XR) 750 MG 24 hr tablet Take 750 mg by mouth. 06/22/14   Historical Provider, MD  METOPROLOL TARTRATE PO Take 100 mg by mouth daily.     Historical Provider, MD  omeprazole (PRILOSEC) 20 MG capsule Take 20 mg by mouth daily as needed.     Historical Provider, MD    Allergies Balsam peru-castor [amberderm]; Carafate [sucralfate]; Dmdm hydantoin; Lisinopril; Peanut-containing drug products; Pepcid [famotidine]; Silver; Tape; Xanax [alprazolam]; and Zantac [ranitidine hcl]  Family History  Problem Relation Age of Onset  . Ovarian cancer Mother     Social History Social History  Substance Use Topics  . Smoking status: Former Smoker    Years: 30.00    Quit date: 04/10/1988  . Smokeless tobacco: Never Used  . Alcohol use 0.0 oz/week     Comment: once in a while    Review of Systems  Constitutional: Negative for fever. Cardiovascular: Negative for chest pain. Respiratory: Negative for shortness of breath. Gastrointestinal: Negative for abdominal pain, vomiting and diarrhea. Neurological: Negative for headaches, focal weakness or numbness.  10-point ROS otherwise negative.  ____________________________________________   PHYSICAL EXAM:  VITAL SIGNS: ED Triage Vitals  Enc Vitals Group     BP 11/23/15 0430 (!) 183/86     Pulse Rate 11/23/15 0430 72     Resp 11/23/15 0430 18     Temp 11/23/15 0430 97.5 F (36.4 C)     Temp Source 11/23/15 0430 Oral     SpO2 11/23/15 0430 93 %     Weight  11/23/15 0431 209 lb (94.8 kg)     Height 11/23/15 0431 5' (1.524 m)   Constitutional: Alert and oriented. Well appearing and in no distress. Eyes: Conjunctivae are normal. PERRL. Normal extraocular movements. ENT   Head: Normocephalic and atraumatic.   Nose: No congestion/rhinnorhea.   Mouth/Throat: Mucous membranes are moist.   Neck: No stridor. Hematological/Lymphatic/Immunilogical: No cervical lymphadenopathy. Cardiovascular: Normal rate, regular rhythm.  No murmurs, rubs, or gallops. Respiratory: Normal respiratory effort without tachypnea nor retractions. Breath sounds are clear and equal bilaterally. No wheezes/rales/rhonchi. Gastrointestinal: Soft and nontender. No distention.  Genitourinary: Deferred Musculoskeletal: Normal range of motion  in all extremities. No joint effusions.  No lower extremity tenderness nor edema. Neurologic:  Normal speech and language. No gross focal neurologic deficits are appreciated.  Skin:  Skin is warm, dry and intact. No rash noted. Psychiatric: Mood and affect are normal. Speech and behavior are normal. Patient exhibits appropriate insight and judgment.  ____________________________________________    LABS (pertinent positives/negatives)  Labs Reviewed  BASIC METABOLIC PANEL - Abnormal; Notable for the following:       Result Value   Potassium 3.2 (*)    Glucose, Bld 174 (*)    All other components within normal limits  CBC  TROPONIN I     ____________________________________________   EKG  None  ____________________________________________    RADIOLOGY  None  ____________________________________________   PROCEDURES  Procedures  ____________________________________________   INITIAL IMPRESSION / ASSESSMENT AND PLAN / ED COURSE  Pertinent labs & imaging results that were available during my care of the patient were reviewed by me and considered in my medical decision making (see chart for  details).  Patient presented to the emergency department today because of continued episode of hot flash and to let us know that they think they have found bed bugs at her house. At this point doubt a vector bourne illness. Blood work today also without concerning findings. Will discharge home to follow up with PCP.  ____________________________________________   FINAL CLINICAL IMPRESSION(S) / ED DIAGNOSES  Final diagnoses:  Hot flashes     Note: This dictation was prepared with Dragon dictation. Any transcriptional errors that result from this process are unintentional    Nance Pear, MD 11/23/15 [phone]

## 2015-11-23 NOTE — ED Triage Notes (Signed)
Pt ambulatory to triage with steady gait with c/o nausea, hot flashes, and hypertension for the past week. Pt reports was seen here yesterday for similar symptoms. Pt reports symptoms are not resolving. Pt alert and oriented x 4, no increased work in breathing.

## 2015-11-23 NOTE — Discharge Instructions (Signed)
Please seek medical attention for any high fevers, chest pain, shortness of breath, change in behavior, persistent vomiting, bloody stool or any other new or concerning symptoms.  

## 2015-11-26 ENCOUNTER — Telehealth: Payer: Self-pay

## 2015-11-26 DIAGNOSIS — R0789 Other chest pain: Secondary | ICD-10-CM | POA: Diagnosis not present

## 2015-11-26 DIAGNOSIS — R232 Flushing: Secondary | ICD-10-CM | POA: Diagnosis not present

## 2015-11-26 NOTE — Telephone Encounter (Signed)
The patient called to say the she has been feeling very ill and nauseated since having an upper and lower endoscopy done on 10/28/15 by Dr Tiffany Kocher. I advised the patient to call Dr Elliot's office to notify them of what has been happening since he last saw her. The patient is amendable to this.

## 2015-11-29 DIAGNOSIS — M1611 Unilateral primary osteoarthritis, right hip: Secondary | ICD-10-CM | POA: Diagnosis not present

## 2015-11-29 DIAGNOSIS — R0789 Other chest pain: Secondary | ICD-10-CM | POA: Diagnosis not present

## 2015-11-29 DIAGNOSIS — M25551 Pain in right hip: Secondary | ICD-10-CM | POA: Diagnosis not present

## 2015-12-05 MED ORDER — ONDANSETRON 4 MG PO TBDP
ORAL_TABLET | ORAL | Status: AC
  Filled 2015-12-05: qty 1

## 2015-12-06 DIAGNOSIS — S40861D Insect bite (nonvenomous) of right upper arm, subsequent encounter: Secondary | ICD-10-CM | POA: Diagnosis not present

## 2015-12-06 DIAGNOSIS — S50862D Insect bite (nonvenomous) of left forearm, subsequent encounter: Secondary | ICD-10-CM | POA: Diagnosis not present

## 2015-12-06 DIAGNOSIS — S30861D Insect bite (nonvenomous) of abdominal wall, subsequent encounter: Secondary | ICD-10-CM | POA: Diagnosis not present

## 2015-12-08 DIAGNOSIS — G4733 Obstructive sleep apnea (adult) (pediatric): Secondary | ICD-10-CM | POA: Diagnosis not present

## 2015-12-08 DIAGNOSIS — M533 Sacrococcygeal disorders, not elsewhere classified: Secondary | ICD-10-CM | POA: Diagnosis not present

## 2015-12-16 DIAGNOSIS — F419 Anxiety disorder, unspecified: Secondary | ICD-10-CM | POA: Diagnosis not present

## 2015-12-16 DIAGNOSIS — I10 Essential (primary) hypertension: Secondary | ICD-10-CM | POA: Diagnosis not present

## 2016-01-17 DIAGNOSIS — K219 Gastro-esophageal reflux disease without esophagitis: Secondary | ICD-10-CM | POA: Diagnosis not present

## 2016-01-17 DIAGNOSIS — I10 Essential (primary) hypertension: Secondary | ICD-10-CM | POA: Diagnosis not present

## 2016-01-17 DIAGNOSIS — Z23 Encounter for immunization: Secondary | ICD-10-CM | POA: Diagnosis not present

## 2016-01-17 DIAGNOSIS — G4730 Sleep apnea, unspecified: Secondary | ICD-10-CM | POA: Diagnosis not present

## 2016-01-17 DIAGNOSIS — R600 Localized edema: Secondary | ICD-10-CM | POA: Diagnosis not present

## 2016-01-17 DIAGNOSIS — E119 Type 2 diabetes mellitus without complications: Secondary | ICD-10-CM | POA: Diagnosis not present

## 2016-01-17 DIAGNOSIS — Z794 Long term (current) use of insulin: Secondary | ICD-10-CM | POA: Diagnosis not present

## 2016-01-17 DIAGNOSIS — R110 Nausea: Secondary | ICD-10-CM | POA: Diagnosis not present

## 2016-01-17 DIAGNOSIS — R079 Chest pain, unspecified: Secondary | ICD-10-CM | POA: Diagnosis not present

## 2016-01-17 DIAGNOSIS — E782 Mixed hyperlipidemia: Secondary | ICD-10-CM | POA: Diagnosis not present

## 2016-01-27 DIAGNOSIS — F419 Anxiety disorder, unspecified: Secondary | ICD-10-CM | POA: Diagnosis not present

## 2016-01-27 DIAGNOSIS — E119 Type 2 diabetes mellitus without complications: Secondary | ICD-10-CM | POA: Diagnosis not present

## 2016-01-27 DIAGNOSIS — Z6841 Body Mass Index (BMI) 40.0 and over, adult: Secondary | ICD-10-CM | POA: Diagnosis not present

## 2016-01-27 DIAGNOSIS — I10 Essential (primary) hypertension: Secondary | ICD-10-CM | POA: Diagnosis not present

## 2016-02-02 DIAGNOSIS — E119 Type 2 diabetes mellitus without complications: Secondary | ICD-10-CM | POA: Diagnosis not present

## 2016-02-03 DIAGNOSIS — I10 Essential (primary) hypertension: Secondary | ICD-10-CM | POA: Diagnosis not present

## 2016-02-03 DIAGNOSIS — E782 Mixed hyperlipidemia: Secondary | ICD-10-CM | POA: Diagnosis not present

## 2016-02-24 DIAGNOSIS — E782 Mixed hyperlipidemia: Secondary | ICD-10-CM | POA: Diagnosis not present

## 2016-02-24 DIAGNOSIS — Z794 Long term (current) use of insulin: Secondary | ICD-10-CM | POA: Diagnosis not present

## 2016-02-24 DIAGNOSIS — E119 Type 2 diabetes mellitus without complications: Secondary | ICD-10-CM | POA: Diagnosis not present

## 2016-02-24 DIAGNOSIS — I10 Essential (primary) hypertension: Secondary | ICD-10-CM | POA: Diagnosis not present

## 2016-02-24 DIAGNOSIS — K219 Gastro-esophageal reflux disease without esophagitis: Secondary | ICD-10-CM | POA: Diagnosis not present

## 2016-03-14 DIAGNOSIS — D225 Melanocytic nevi of trunk: Secondary | ICD-10-CM | POA: Diagnosis not present

## 2016-03-14 DIAGNOSIS — D2271 Melanocytic nevi of right lower limb, including hip: Secondary | ICD-10-CM | POA: Diagnosis not present

## 2016-03-14 DIAGNOSIS — X32XXXA Exposure to sunlight, initial encounter: Secondary | ICD-10-CM | POA: Diagnosis not present

## 2016-03-14 DIAGNOSIS — Z85820 Personal history of malignant melanoma of skin: Secondary | ICD-10-CM | POA: Diagnosis not present

## 2016-03-14 DIAGNOSIS — Z85828 Personal history of other malignant neoplasm of skin: Secondary | ICD-10-CM | POA: Diagnosis not present

## 2016-03-14 DIAGNOSIS — L570 Actinic keratosis: Secondary | ICD-10-CM | POA: Diagnosis not present

## 2016-03-17 DIAGNOSIS — R0602 Shortness of breath: Secondary | ICD-10-CM | POA: Diagnosis not present

## 2016-03-17 DIAGNOSIS — Z6841 Body Mass Index (BMI) 40.0 and over, adult: Secondary | ICD-10-CM | POA: Diagnosis not present

## 2016-03-17 DIAGNOSIS — I10 Essential (primary) hypertension: Secondary | ICD-10-CM | POA: Diagnosis not present

## 2016-03-17 DIAGNOSIS — E782 Mixed hyperlipidemia: Secondary | ICD-10-CM | POA: Diagnosis not present

## 2016-04-12 DIAGNOSIS — R0602 Shortness of breath: Secondary | ICD-10-CM | POA: Diagnosis not present

## 2016-04-18 DIAGNOSIS — I10 Essential (primary) hypertension: Secondary | ICD-10-CM | POA: Diagnosis not present

## 2016-04-18 DIAGNOSIS — G4730 Sleep apnea, unspecified: Secondary | ICD-10-CM | POA: Diagnosis not present

## 2016-04-18 DIAGNOSIS — E782 Mixed hyperlipidemia: Secondary | ICD-10-CM | POA: Diagnosis not present

## 2016-06-09 ENCOUNTER — Telehealth: Payer: Self-pay | Admitting: General Surgery

## 2016-06-09 ENCOUNTER — Other Ambulatory Visit: Payer: Self-pay

## 2016-06-09 NOTE — Telephone Encounter (Signed)
L/M ON CELL# & CALLED HOME # BUT MESSAGES WHERE FULL. PT TO CALL BACK TO LET CARY-LYN KNOW WHERE TO SCHEDULE THIS YEARS MAMMO AT [facility].

## 2016-06-13 ENCOUNTER — Other Ambulatory Visit: Payer: Self-pay

## 2016-06-13 DIAGNOSIS — Z1231 Encounter for screening mammogram for malignant neoplasm of breast: Secondary | ICD-10-CM

## 2016-06-19 ENCOUNTER — Ambulatory Visit (INDEPENDENT_AMBULATORY_CARE_PROVIDER_SITE_OTHER): Payer: PPO | Admitting: Vascular Surgery

## 2016-06-19 ENCOUNTER — Encounter (INDEPENDENT_AMBULATORY_CARE_PROVIDER_SITE_OTHER): Payer: PPO

## 2016-06-28 ENCOUNTER — Other Ambulatory Visit (INDEPENDENT_AMBULATORY_CARE_PROVIDER_SITE_OTHER): Payer: Self-pay | Admitting: Vascular Surgery

## 2016-06-28 DIAGNOSIS — I6523 Occlusion and stenosis of bilateral carotid arteries: Secondary | ICD-10-CM

## 2016-07-10 ENCOUNTER — Ambulatory Visit (INDEPENDENT_AMBULATORY_CARE_PROVIDER_SITE_OTHER): Payer: PPO

## 2016-07-10 ENCOUNTER — Encounter (INDEPENDENT_AMBULATORY_CARE_PROVIDER_SITE_OTHER): Payer: Self-pay | Admitting: Vascular Surgery

## 2016-07-10 ENCOUNTER — Ambulatory Visit (INDEPENDENT_AMBULATORY_CARE_PROVIDER_SITE_OTHER): Payer: PPO | Admitting: Vascular Surgery

## 2016-07-10 VITALS — BP 146/83 | HR 60 | Resp 17 | Wt 211.0 lb

## 2016-07-10 DIAGNOSIS — I6529 Occlusion and stenosis of unspecified carotid artery: Secondary | ICD-10-CM | POA: Insufficient documentation

## 2016-07-10 DIAGNOSIS — E785 Hyperlipidemia, unspecified: Secondary | ICD-10-CM

## 2016-07-10 DIAGNOSIS — I6523 Occlusion and stenosis of bilateral carotid arteries: Secondary | ICD-10-CM | POA: Diagnosis not present

## 2016-07-10 DIAGNOSIS — I10 Essential (primary) hypertension: Secondary | ICD-10-CM

## 2016-07-10 NOTE — Progress Notes (Signed)
Subjective:    Patient ID: Kathryn Hammond, female    DOB: 1940/05/22, 76 y.o.   MRN: [id number] Chief Complaint  Patient presents with  . Follow-up   Patient presents for a yearly non-invasive study follow up for carotid stenosis. The stenosis has been followed by surveillance duplexes. The patient underwent a bilateral carotid duplex scan which showed no change from the previous exam on 06/14/15. Duplex is stable at1-39% ICA stenosis bilaterally. The patient denies experiencing Amaurosis Fugax, TIA like symptoms or focal motor deficits.    Review of Systems  All other systems reviewed and are negative.     Objective:   Physical Exam  Constitutional: She is oriented to person, place, and time. She appears well-developed. No distress.  HENT:  Head: Normocephalic and atraumatic.  Eyes: Conjunctivae are normal. Pupils are equal, round, and reactive to light.  Neck: Normal range of motion.  No carotid bruits noted.   Cardiovascular: Normal rate, regular rhythm and normal heart sounds.   Pulses:      Radial pulses are 2+ on the right side, and 2+ on the left side.  Pulmonary/Chest: Effort normal.  Musculoskeletal: Normal range of motion. She exhibits no edema.  Neurological: She is alert and oriented to person, place, and time.  Skin: Skin is warm and dry. She is not diaphoretic.  Psychiatric: She has a normal mood and affect. Her behavior is normal. Judgment and thought content normal.  Vitals reviewed.  BP (!) 146/83   Pulse 60   Resp 17   Wt 211 lb (95.7 kg)   BMI 41.21 kg/m   Past Medical History:  Diagnosis Date  . Arthritis   . Asthma   . CPAP (continuous positive airway pressure) dependence 15 years  . Depression   . Dermatitis   . Diabetes ([place])    non-insulin dependent  . Emphysema   . Fatty liver disease, nonalcoholic   . GERD (gastroesophageal reflux disease)   . History of hiatal hernia   . Hyperlipidemia   . Lung cancer ([place])   . Melanoma ([place])    right temple  . Obesity, unspecified   . Personal history of malignant neoplasm of breast    left breast DCIS  . Personal history of malignant neoplasm of bronchus and lung    right lung cancer  . Personal history of tobacco use, presenting hazards to health   . Reflux esophagitis   . Screening for obesity   . Seasonal allergies   . Sleep apnea   . Ulcer ([place])    peptic  . Unspecified essential hypertension   . Varicose veins of lower extremities with other complications    Social History   Social History  . Marital status: Married    Spouse name: N/A  . Number of children: N/A  . Years of education: N/A   Occupational History  . Not on file.   Social History Main Topics  . Smoking status: Former Smoker    Years: 30.00    Quit date: 04/10/1988  . Smokeless tobacco: Never Used  . Alcohol use 0.0 oz/week     Comment: once in a while  . Drug use: No  . Sexual activity: Not on file   Other Topics Concern  . Not on file   Social History Narrative  . No narrative on file   Past Surgical History:  Procedure Laterality Date  . ABDOMINAL HYSTERECTOMY  1972  . BREAST BIOPSY    . BREAST LUMPECTOMY Left  2010   R for DCIS  . BREAST SURGERY Left 2010   mammosite balloon placement and removal  . COLONOSCOPY  2007 and 2012   Dr. Vira Agar at [facility]; polyps  . COLONOSCOPY WITH PROPOFOL N/A 10/28/2015   Procedure: COLONOSCOPY WITH PROPOFOL;  Surgeon: Manya Silvas, MD;  Location: [facility] ENDOSCOPY;  Service: Endoscopy;  Laterality: N/A;  . ESOPHAGOGASTRODUODENOSCOPY (EGD) WITH PROPOFOL N/A 10/28/2015   Procedure: ESOPHAGOGASTRODUODENOSCOPY (EGD) WITH PROPOFOL;  Surgeon: Manya Silvas, MD;  Location: [facility] ENDOSCOPY;  Service: Endoscopy;  Laterality: N/A;  . EYE SURGERY  1992  . HERNIA REPAIR  1990  . KNEE SURGERY  1999   knee replaced  . KNEE SURGERY  1977   knees replaced  . LOBECTOMY  1989  . MELANOMA EXCISION  December 07, 2009   right temple  . MOHS SURGERY  2016   [facility],  nose  . SALPINGOOPHORECTOMY  1972  . VEIN SURGERY  September 2011   vein closure procedure left leg; RF ablation of GSV in both legs   Family History  Problem Relation Age of Onset  . Ovarian cancer Mother    Allergies  Allergen Reactions  . Balsam [place]-Castor [Amberderm]   . Carafate [Sucralfate]   . Dmdm Hydantoin   . Lisinopril Swelling  . Peanut-Containing Drug Products   . Pepcid [Famotidine]   . Silver Other (See Comments)    blisters  . Tape Itching  . Xanax [Alprazolam]   . Zantac [Ranitidine Hcl] Hives      Assessment & Plan:  Patient presents for a yearly non-invasive study follow up for carotid stenosis. The stenosis has been followed by surveillance duplexes. The patient underwent a bilateral carotid duplex scan which showed no change from the previous exam on 06/14/15. Duplex is stable at1-39% ICA stenosis bilaterally. The patient denies experiencing Amaurosis Fugax, TIA like symptoms or focal motor deficits.   1. Bilateral carotid artery stenosis - Stable Studies reviewed with patient. Patient asymptomatic with stable duplex.  No intervention at this time.  Patient to return in six months for surveillance carotid duplex. Patient to continue medical optimization with ASA and dyslipidemia medication. Patient to remain abstinent of tobacco use. I have discussed with the patient at length the risk factors for and pathogenesis of atherosclerotic disease and encouraged a healthy diet, regular exercise regimen and blood pressure / glucose control.  Patient was instructed to contact our office in the interim with problems such as arm / leg weakness or numbness, speech / swallowing difficulty or temporary monocular blindness. The patient expresses their understanding  - VAS US CAROTID; Future  2. Hyperlipidemia, unspecified hyperlipidemia type - Stable Encouraged good control as its slows the progression of atherosclerotic disease  3. Essential hypertension -  Stable Encouraged good control as its slows the progression of atherosclerotic disease  Current Outpatient Prescriptions on File Prior to Visit  Medication Sig Dispense Refill  . Acetaminophen (TYLENOL 8 HOUR PO) Take by mouth daily.     Marland Kitchen amLODipine (NORVASC) 5 MG tablet Take 1 tablet by mouth daily.    Marland Kitchen aspirin 81 MG tablet Take 81 mg by mouth daily.    Marland Kitchen atorvastatin (LIPITOR) 40 MG tablet Take 1 tablet by mouth daily.    Marland Kitchen BIOTIN PO Take 2 tablets by mouth daily.    . Cholecalciferol (DIALYVITE VITAMIN D 5000 PO) Take 1 tablet by mouth daily.     . clonazePAM (KLONOPIN) 0.5 MG tablet Take 0.5 mg by mouth 2 (two) times  daily as needed for anxiety.    . fluticasone (FLONASE) 50 MCG/ACT nasal spray Place 2 sprays into both nostrils daily.    . hydrochlorothiazide (HYDRODIURIL) 12.5 MG tablet Take 1 tablet by mouth daily.    Marland Kitchen ipratropium (ATROVENT HFA) 17 MCG/ACT inhaler Inhale 2 puffs into the lungs every 6 (six) hours.    Marland Kitchen losartan (COZAAR) 100 MG tablet Take 1 tablet by mouth daily at 6 (six) AM.    . metFORMIN (GLUCOPHAGE-XR) 750 MG 24 hr tablet Take 750 mg by mouth.    . METOPROLOL TARTRATE PO Take 100 mg by mouth daily.     Marland Kitchen omeprazole (PRILOSEC) 20 MG capsule Take 20 mg by mouth daily as needed.     . cetirizine (ZYRTEC) 10 MG tablet Take 10 mg by mouth daily.    . fexofenadine (ALLEGRA) 60 MG tablet Take 60 mg by mouth 2 (two) times daily.     No current facility-administered medications on file prior to visit.     There are no Patient Instructions on file for this visit. No Follow-up on file.   Rashon Westrup A Tara Rud, PA-C

## 2016-08-16 DIAGNOSIS — H40033 Anatomical narrow angle, bilateral: Secondary | ICD-10-CM | POA: Diagnosis not present

## 2016-08-16 DIAGNOSIS — H2511 Age-related nuclear cataract, right eye: Secondary | ICD-10-CM | POA: Diagnosis not present

## 2016-08-16 DIAGNOSIS — H35413 Lattice degeneration of retina, bilateral: Secondary | ICD-10-CM | POA: Diagnosis not present

## 2016-08-29 ENCOUNTER — Ambulatory Visit: Payer: PPO

## 2016-08-30 DIAGNOSIS — J209 Acute bronchitis, unspecified: Secondary | ICD-10-CM | POA: Diagnosis not present

## 2016-09-05 DIAGNOSIS — H2511 Age-related nuclear cataract, right eye: Secondary | ICD-10-CM | POA: Diagnosis not present

## 2016-09-07 ENCOUNTER — Ambulatory Visit
Admission: RE | Admit: 2016-09-07 | Discharge: 2016-09-07 | Disposition: A | Discharge status: Home / Self Care | Payer: PPO | Source: Ambulatory Visit | Attending: General Surgery | Admitting: General Surgery

## 2016-09-07 ENCOUNTER — Ambulatory Visit: Payer: PPO | Admitting: General Surgery

## 2016-09-07 DIAGNOSIS — Z1231 Encounter for screening mammogram for malignant neoplasm of breast: Secondary | ICD-10-CM | POA: Insufficient documentation

## 2016-09-08 ENCOUNTER — Encounter: Payer: Self-pay | Admitting: *Deleted

## 2016-09-12 ENCOUNTER — Encounter
Admission: RE | Disposition: A | Discharge status: Home / Self Care | Payer: Self-pay | Source: Ambulatory Visit | Attending: Ophthalmology

## 2016-09-12 ENCOUNTER — Ambulatory Visit: Payer: PPO | Admitting: Anesthesiology

## 2016-09-12 ENCOUNTER — Encounter: Payer: Self-pay | Admitting: *Deleted

## 2016-09-12 ENCOUNTER — Ambulatory Visit
Admission: RE | Admit: 2016-09-12 | Discharge: 2016-09-12 | Disposition: A | Discharge status: Home / Self Care | Payer: PPO | Source: Ambulatory Visit | Attending: Ophthalmology | Admitting: Ophthalmology

## 2016-09-12 DIAGNOSIS — E1136 Type 2 diabetes mellitus with diabetic cataract: Secondary | ICD-10-CM | POA: Diagnosis not present

## 2016-09-12 DIAGNOSIS — E669 Obesity, unspecified: Secondary | ICD-10-CM | POA: Insufficient documentation

## 2016-09-12 DIAGNOSIS — J449 Chronic obstructive pulmonary disease, unspecified: Secondary | ICD-10-CM | POA: Diagnosis not present

## 2016-09-12 DIAGNOSIS — M1990 Unspecified osteoarthritis, unspecified site: Secondary | ICD-10-CM | POA: Diagnosis not present

## 2016-09-12 DIAGNOSIS — K219 Gastro-esophageal reflux disease without esophagitis: Secondary | ICD-10-CM | POA: Insufficient documentation

## 2016-09-12 DIAGNOSIS — R2243 Localized swelling, mass and lump, lower limb, bilateral: Secondary | ICD-10-CM | POA: Insufficient documentation

## 2016-09-12 DIAGNOSIS — Z8711 Personal history of peptic ulcer disease: Secondary | ICD-10-CM | POA: Insufficient documentation

## 2016-09-12 DIAGNOSIS — E1140 Type 2 diabetes mellitus with diabetic neuropathy, unspecified: Secondary | ICD-10-CM | POA: Insufficient documentation

## 2016-09-12 DIAGNOSIS — J439 Emphysema, unspecified: Secondary | ICD-10-CM | POA: Insufficient documentation

## 2016-09-12 DIAGNOSIS — Z888 Allergy status to other drugs, medicaments and biological substances status: Secondary | ICD-10-CM | POA: Insufficient documentation

## 2016-09-12 DIAGNOSIS — I10 Essential (primary) hypertension: Secondary | ICD-10-CM | POA: Insufficient documentation

## 2016-09-12 DIAGNOSIS — M8580 Other specified disorders of bone density and structure, unspecified site: Secondary | ICD-10-CM | POA: Insufficient documentation

## 2016-09-12 DIAGNOSIS — Z85118 Personal history of other malignant neoplasm of bronchus and lung: Secondary | ICD-10-CM | POA: Diagnosis not present

## 2016-09-12 DIAGNOSIS — Z853 Personal history of malignant neoplasm of breast: Secondary | ICD-10-CM | POA: Diagnosis not present

## 2016-09-12 DIAGNOSIS — I739 Peripheral vascular disease, unspecified: Secondary | ICD-10-CM | POA: Diagnosis not present

## 2016-09-12 DIAGNOSIS — G4730 Sleep apnea, unspecified: Secondary | ICD-10-CM | POA: Diagnosis not present

## 2016-09-12 DIAGNOSIS — Z85828 Personal history of other malignant neoplasm of skin: Secondary | ICD-10-CM | POA: Diagnosis not present

## 2016-09-12 DIAGNOSIS — K449 Diaphragmatic hernia without obstruction or gangrene: Secondary | ICD-10-CM | POA: Diagnosis not present

## 2016-09-12 DIAGNOSIS — F329 Major depressive disorder, single episode, unspecified: Secondary | ICD-10-CM | POA: Diagnosis not present

## 2016-09-12 DIAGNOSIS — H2511 Age-related nuclear cataract, right eye: Secondary | ICD-10-CM | POA: Insufficient documentation

## 2016-09-12 DIAGNOSIS — Z87891 Personal history of nicotine dependence: Secondary | ICD-10-CM | POA: Diagnosis not present

## 2016-09-12 HISTORY — DX: Myoneural disorder, unspecified: G70.9

## 2016-09-12 HISTORY — PX: CATARACT EXTRACTION W/PHACO: SHX586

## 2016-09-12 HISTORY — DX: Dyspnea, unspecified: R06.00

## 2016-09-12 LAB — GLUCOSE, CAPILLARY: Glucose-Capillary: 153 mg/dL — ABNORMAL HIGH (ref 65–99)

## 2016-09-12 SURGERY — PHACOEMULSIFICATION, CATARACT, WITH IOL INSERTION
Anesthesia: Monitor Anesthesia Care | Site: Eye | Laterality: Right | Wound class: Clean

## 2016-09-12 MED ORDER — ARMC OPHTHALMIC DILATING DROPS
OPHTHALMIC | Status: AC
  Filled 2016-09-12: qty 0.4

## 2016-09-12 MED ORDER — NA CHONDROIT SULF-NA HYALURON 40-17 MG/ML IO SOLN
INTRAOCULAR | Status: AC
  Filled 2016-09-12: qty 1

## 2016-09-12 MED ORDER — FENTANYL CITRATE (PF) 100 MCG/2ML IJ SOLN
INTRAMUSCULAR | Status: DC | PRN
  Administered 2016-09-12: 25 ug via INTRAVENOUS

## 2016-09-12 MED ORDER — MOXIFLOXACIN HCL 0.5 % OP SOLN: 1.0000 [drp] | OPHTHALMIC | Status: DC | PRN

## 2016-09-12 MED ORDER — SODIUM CHLORIDE 0.9 % IV SOLN
INTRAVENOUS | Status: DC
  Administered 2016-09-12: 08:00:00 via INTRAVENOUS

## 2016-09-12 MED ORDER — POVIDONE-IODINE 5 % OP SOLN
OPHTHALMIC | Status: AC
  Filled 2016-09-12: qty 30

## 2016-09-12 MED ORDER — EPINEPHRINE PF 1 MG/ML IJ SOLN
INTRAMUSCULAR | Status: AC
  Filled 2016-09-12: qty 2

## 2016-09-12 MED ORDER — POVIDONE-IODINE 5 % OP SOLN
OPHTHALMIC | Status: DC | PRN
  Administered 2016-09-12: 1 via OPHTHALMIC

## 2016-09-12 MED ORDER — NA CHONDROIT SULF-NA HYALURON 40-17 MG/ML IO SOLN
INTRAOCULAR | Status: DC | PRN
  Administered 2016-09-12: 1 mL via INTRAOCULAR

## 2016-09-12 MED ORDER — MIDAZOLAM HCL 2 MG/2ML IJ SOLN
INTRAMUSCULAR | Status: AC
  Filled 2016-09-12: qty 2

## 2016-09-12 MED ORDER — LIDOCAINE HCL (PF) 4 % IJ SOLN
INTRAOCULAR | Status: DC | PRN
  Administered 2016-09-12: 4 mL via OPHTHALMIC

## 2016-09-12 MED ORDER — BSS IO SOLN
INTRAOCULAR | Status: DC | PRN
  Administered 2016-09-12: 09:00:00 via OPHTHALMIC

## 2016-09-12 MED ORDER — MOXIFLOXACIN HCL 0.5 % OP SOLN
OPHTHALMIC | Status: AC
  Filled 2016-09-12: qty 3

## 2016-09-12 MED ORDER — MOXIFLOXACIN HCL 0.5 % OP SOLN
OPHTHALMIC | Status: DC | PRN
  Administered 2016-09-12: 0.2 mL via OPHTHALMIC

## 2016-09-12 MED ORDER — ARMC OPHTHALMIC DILATING DROPS
1.0000 "application " | OPHTHALMIC | Status: AC
  Administered 2016-09-12 (×2): 1 via OPHTHALMIC

## 2016-09-12 MED ORDER — CARBACHOL 0.01 % IO SOLN
INTRAOCULAR | Status: DC | PRN
  Administered 2016-09-12: 0.5 mL via INTRAOCULAR

## 2016-09-12 MED ORDER — FENTANYL CITRATE (PF) 100 MCG/2ML IJ SOLN
INTRAMUSCULAR | Status: AC
  Filled 2016-09-12: qty 2

## 2016-09-12 MED ORDER — LIDOCAINE HCL (PF) 2 % IJ SOLN
INTRAMUSCULAR | Status: AC
  Filled 2016-09-12: qty 2

## 2016-09-12 MED ORDER — MIDAZOLAM HCL 2 MG/2ML IJ SOLN
INTRAMUSCULAR | Status: DC | PRN
  Administered 2016-09-12: 1 mg via INTRAVENOUS

## 2016-09-12 SURGICAL SUPPLY — 14 items
GLOVE BIO SURGEON STRL SZ8 (GLOVE) ×3 IMPLANT
GLOVE BIOGEL M 6.5 STRL (GLOVE) ×3 IMPLANT
GLOVE SURG LX 8.0 MICRO (GLOVE) ×2
GLOVE SURG LX STRL 8.0 MICRO (GLOVE) ×1 IMPLANT
GOWN STRL REUS W/ TWL LRG LVL3 (GOWN DISPOSABLE) ×2 IMPLANT
GOWN STRL REUS W/TWL LRG LVL3 (GOWN DISPOSABLE) ×4
LENS IOL TECNIS ITEC 24.5 (Intraocular Lens) ×3 IMPLANT
PACK CATARACT (MISCELLANEOUS) ×3 IMPLANT
PACK CATARACT BRASINGTON LX (MISCELLANEOUS) ×3 IMPLANT
SOL BSS BAG (MISCELLANEOUS) ×3
SOLUTION BSS BAG (MISCELLANEOUS) ×1 IMPLANT
SYR 5ML LL (SYRINGE) ×3 IMPLANT
WATER STERILE IRR 250ML POUR (IV SOLUTION) ×3 IMPLANT
WIPE NON LINTING 3.25X3.25 (MISCELLANEOUS) ×3 IMPLANT

## 2016-09-12 NOTE — Anesthesia Procedure Notes (Signed)
Procedure Name: MAC Date/Time: 09/12/2016 9:10 AM Performed by: Darlyne Russian Pre-anesthesia Checklist: Patient identified, Emergency Drugs available, Suction available, Patient being monitored and Timeout performed Oxygen Delivery Method: Nasal cannula Placement Confirmation: positive ETCO2

## 2016-09-12 NOTE — Transfer of Care (Signed)
Immediate Anesthesia Transfer of Care Note  Patient: Kathryn Hammond  Procedure(s) Performed: Procedure(s) with comments: CATARACT EXTRACTION PHACO AND INTRAOCULAR LENS PLACEMENT (IOC) (Right) - [place] 00:38.2 AP% 10.9 CDE 4.16 Fluid Pack lot # [id number]  Patient Location: PACU  Anesthesia Type:MAC  Level of Consciousness: awake, alert  and oriented  Airway & Oxygen Therapy: Patient Spontanous Breathing  Post-op Assessment: Report given to RN and Post -op Vital signs reviewed and stable  Post vital signs: Reviewed and stable  Last Vitals:  Vitals:   09/12/16 0806  BP: (!) 160/109  Pulse: (!) 56  Resp: 18  Temp: 36.6 C    Last Pain:  Vitals:   09/12/16 0806  TempSrc: Oral         Complications: No apparent anesthesia complications

## 2016-09-12 NOTE — H&P (Signed)
All labs reviewed. Abnormal studies sent to patients PCP when indicated.  Previous H&P reviewed, patient examined, there are NO CHANGES.  Kathryn Hammond LOUIS6/5/20189:04 AM

## 2016-09-12 NOTE — Anesthesia Preprocedure Evaluation (Addendum)
Anesthesia Evaluation  Patient identified by MRN, date of birth, ID band Patient awake    Reviewed: Allergy & Precautions, NPO status , Patient's Chart, lab work & pertinent test results, reviewed documented beta blocker date and time   Airway Mallampati: III  TM Distance: >3 FB     Dental  (+) Chipped   Pulmonary shortness of breath, asthma , sleep apnea and Continuous Positive Airway Pressure Ventilation , COPD, former smoker,           Cardiovascular hypertension, Pt. on medications and Pt. on home beta blockers + Peripheral Vascular Disease       Neuro/Psych PSYCHIATRIC DISORDERS Depression  Neuromuscular disease    GI/Hepatic hiatal hernia, GERD  Controlled,  Endo/Other  diabetes, Type 2  Renal/GU      Musculoskeletal  (+) Arthritis ,   Abdominal   Peds  Hematology   Anesthesia Other Findings Obese. Lung ca.  Reproductive/Obstetrics                            Anesthesia Physical Anesthesia Plan  ASA: III  Anesthesia Plan: MAC   Post-op Pain Management:    Induction:   PONV Risk Score and Plan:   Airway Management Planned:   Additional Equipment:   Intra-op Plan:   Post-operative Plan:   Informed Consent: I have reviewed the patients History and Physical, chart, labs and discussed the procedure including the risks, benefits and alternatives for the proposed anesthesia with the patient or authorized representative who has indicated his/her understanding and acceptance.     Plan Discussed with: CRNA  Anesthesia Plan Comments:         Anesthesia Quick Evaluation

## 2016-09-12 NOTE — Op Note (Signed)
PREOPERATIVE DIAGNOSIS:  Nuclear sclerotic cataract of the right eye.   POSTOPERATIVE DIAGNOSIS:  NUCLEAR SCLEROTIC CATARACT RIGHT EYE   OPERATIVE PROCEDURE: Procedure(s): CATARACT EXTRACTION PHACO AND INTRAOCULAR LENS PLACEMENT (IOC)   SURGEON:  Birder Robson, MD.   ANESTHESIA:  Anesthesiologist: Gunnar Bulla, MD CRNA: Darlyne Russian, CRNA  1.      Managed anesthesia care. 2.      0.32ml of Shugarcaine was instilled in the eye following the paracentesis.   COMPLICATIONS:  None.   TECHNIQUE:   Stop and chop   DESCRIPTION OF PROCEDURE:  The patient was examined and consented in the preoperative holding area where the aforementioned topical anesthesia was applied to the right eye and then brought back to the Operating Room where the right eye was prepped and draped in the usual sterile ophthalmic fashion and a lid speculum was placed. A paracentesis was created with the side port blade and the anterior chamber was filled with viscoelastic. A near clear corneal incision was performed with the steel keratome. A continuous curvilinear capsulorrhexis was performed with a cystotome followed by the capsulorrhexis forceps. Hydrodissection and hydrodelineation were carried out with BSS on a blunt cannula. The lens was removed in a stop and chop  technique and the remaining cortical material was removed with the irrigation-aspiration handpiece. The capsular bag was inflated with viscoelastic and the Technis [id number]  lens was placed in the capsular bag without complication. The remaining viscoelastic was removed from the eye with the irrigation-aspiration handpiece. The wounds were hydrated. The anterior chamber was flushed with Miostat and the eye was inflated to physiologic pressure. 0.82ml of Vigamox was placed in the anterior chamber. The wounds were found to be water tight. The eye was dressed with Vigamox. The patient was given protective glasses to wear throughout the day and a shield with which to  sleep tonight. The patient was also given drops with which to begin a drop regimen today and will follow-up with me in one day.  Implant Name Type Inv. Item Serial No. Manufacturer Lot No. LRB No. Used  LENS IOL DIOP 24.5 - [id number] Intraocular Lens LENS IOL DIOP 24.5 [id number] AMO   Right 1   Procedure(s) with comments: CATARACT EXTRACTION PHACO AND INTRAOCULAR LENS PLACEMENT (IOC) (Right) - [place] 00:38.2 AP% 10.9 CDE 4.16 Fluid Pack lot # [id number]  Electronically signed: [place] 09/12/2016 9:31 AM

## 2016-09-12 NOTE — Anesthesia Postprocedure Evaluation (Signed)
Anesthesia Post Note  Patient: Kathryn Hammond  Procedure(s) Performed: Procedure(s) (LRB): CATARACT EXTRACTION PHACO AND INTRAOCULAR LENS PLACEMENT (IOC) (Right)  Patient location during evaluation: PACU Anesthesia Type: MAC Level of consciousness: awake and alert Pain management: pain level controlled Vital Signs Assessment: post-procedure vital signs reviewed and stable Respiratory status: spontaneous breathing, nonlabored ventilation, respiratory function stable and patient connected to nasal cannula oxygen Cardiovascular status: stable and blood pressure returned to baseline Anesthetic complications: no     Last Vitals:  Vitals:   09/12/16 0806 09/12/16 0933  BP: (!) 160/109 139/67  Pulse: (!) 56 (!) 56  Resp: 18 16  Temp: 36.6 C     Last Pain:  Vitals:   09/12/16 0933  TempSrc: Oral                 Darlyne Russian

## 2016-09-12 NOTE — Anesthesia Post-op Follow-up Note (Cosign Needed)
Anesthesia QCDR form completed.        

## 2016-09-12 NOTE — Discharge Instructions (Signed)
Eye Surgery Discharge Instructions  Expect mild scratchy sensation or mild soreness. DO NOT RUB YOUR EYE!  The day of surgery:  Minimal physical activity, but bed rest is not required  No reading, computer work, or close hand work  No bending, lifting, or straining.  May watch TV  For 24 hours:  No driving, legal decisions, or alcoholic beverages  Safety precautions  Eat anything you prefer: It is better to start with liquids, then soup then solid foods.  _____ Eye patch should be worn until postoperative exam tomorrow.  ____ Solar shield eyeglasses should be worn for comfort in the sunlight/patch while sleeping  Resume all regular medications including aspirin or Coumadin if these were discontinued prior to surgery. You may shower, bathe, shave, or wash your hair. Tylenol may be taken for mild discomfort.  Call your doctor if you experience significant pain, nausea, or vomiting, fever > 101 or other signs of infection. [phone] or [phone] Specific instructions:  Follow-up Information    Birder Robson, MD Follow up on 09/13/2016.   Specialty:  Ophthalmology Why:  At 10:10 am. Contact information: [address] [place] [address] [phone]

## 2016-09-18 ENCOUNTER — Encounter: Payer: Self-pay | Admitting: *Deleted

## 2016-09-19 DIAGNOSIS — H2512 Age-related nuclear cataract, left eye: Secondary | ICD-10-CM | POA: Diagnosis not present

## 2016-09-19 DIAGNOSIS — B078 Other viral warts: Secondary | ICD-10-CM | POA: Diagnosis not present

## 2016-09-19 DIAGNOSIS — D225 Melanocytic nevi of trunk: Secondary | ICD-10-CM | POA: Diagnosis not present

## 2016-09-19 DIAGNOSIS — L0889 Other specified local infections of the skin and subcutaneous tissue: Secondary | ICD-10-CM | POA: Diagnosis not present

## 2016-09-19 DIAGNOSIS — Z85820 Personal history of malignant melanoma of skin: Secondary | ICD-10-CM | POA: Diagnosis not present

## 2016-09-19 DIAGNOSIS — Z85828 Personal history of other malignant neoplasm of skin: Secondary | ICD-10-CM | POA: Diagnosis not present

## 2016-09-19 DIAGNOSIS — D2271 Melanocytic nevi of right lower limb, including hip: Secondary | ICD-10-CM | POA: Diagnosis not present

## 2016-09-20 ENCOUNTER — Encounter: Payer: Self-pay | Admitting: *Deleted

## 2016-09-21 ENCOUNTER — Ambulatory Visit (INDEPENDENT_AMBULATORY_CARE_PROVIDER_SITE_OTHER): Payer: PPO | Admitting: General Surgery

## 2016-09-21 ENCOUNTER — Encounter: Payer: Self-pay | Admitting: General Surgery

## 2016-09-21 VITALS — BP 140/80 | HR 67 | Resp 13 | Ht 60.0 in | Wt 213.0 lb

## 2016-09-21 DIAGNOSIS — Z86000 Personal history of in-situ neoplasm of breast: Secondary | ICD-10-CM

## 2016-09-21 DIAGNOSIS — Z85118 Personal history of other malignant neoplasm of bronchus and lung: Secondary | ICD-10-CM | POA: Diagnosis not present

## 2016-09-21 NOTE — Progress Notes (Signed)
Patient ID: Kathryn Hammond, female   DOB: 1940-08-29, 76 y.o.   MRN: [id number]  Chief Complaint  Patient presents with  . Follow-up    mammogram     HPI Kathryn Hammond is a 76 y.o. female who presents for breast and lung cancer follow up. The most recent mammogram was done on 09/07/2016.  Patient does perform regular self breast checks and gets regular mammograms done.  Recent cataract surgery, doing well. Colonoscopy last yr was normal  HPI  Past Medical History:  Diagnosis Date  . Arthritis   . Asthma   . CPAP (continuous positive airway pressure) dependence 15 years  . Depression   . Dermatitis   . Diabetes ([place])    non-insulin dependent  . Dyspnea   . Emphysema   . Fatty liver disease, nonalcoholic   . GERD (gastroesophageal reflux disease)   . History of hiatal hernia   . Hyperlipidemia   . Lung cancer ([place])   . Melanoma ([place])    right temple  . Neuromuscular disorder ([facility])    neuropathy  . Obesity, unspecified   . Personal history of malignant neoplasm of breast    left breast DCIS  . Personal history of malignant neoplasm of bronchus and lung    right lung cancer  . Personal history of tobacco use, presenting hazards to health   . Reflux esophagitis   . Screening for obesity   . Seasonal allergies   . Sleep apnea    cpap  . Ulcer    peptic  . Unspecified essential hypertension   . Varicose veins of lower extremities with other complications     Past Surgical History:  Procedure Laterality Date  . ABDOMINAL HYSTERECTOMY  1972  . BREAST BIOPSY Right   . BREAST EXCISIONAL BIOPSY Left 2010   DCIS  . BREAST LUMPECTOMY Left 2010   R for DCIS  . BREAST SURGERY Left 2010   mammosite balloon placement and removal  . CATARACT EXTRACTION W/PHACO Right 09/12/2016   Procedure: CATARACT EXTRACTION PHACO AND INTRAOCULAR LENS PLACEMENT (IOC);  Surgeon: Birder Robson, MD;  Location: [facility] ORS;  Service: Ophthalmology;  Laterality: Right;  [place] 00:38.2 AP%  10.9 CDE 4.16 Fluid Pack lot # [id number]  . COLONOSCOPY  2007 and 2012   Dr. Vira Agar at [facility]; polyps  . COLONOSCOPY WITH PROPOFOL N/A 10/28/2015   Procedure: COLONOSCOPY WITH PROPOFOL;  Surgeon: Manya Silvas, MD;  Location: [facility] ENDOSCOPY;  Service: Endoscopy;  Laterality: N/A;  . ESOPHAGOGASTRODUODENOSCOPY (EGD) WITH PROPOFOL N/A 10/28/2015   Procedure: ESOPHAGOGASTRODUODENOSCOPY (EGD) WITH PROPOFOL;  Surgeon: Manya Silvas, MD;  Location: [facility] ENDOSCOPY;  Service: Endoscopy;  Laterality: N/A;  . EYE SURGERY  1992  . HERNIA REPAIR  1990  . KNEE SURGERY  1999   knee replaced  . KNEE SURGERY  1977   knees replaced  . LOBECTOMY  1989  . MELANOMA EXCISION  December 07, 2009   right temple  . MOHS SURGERY  2016   [facility], nose  . SALPINGOOPHORECTOMY  1972  . VEIN SURGERY  September 2011   vein closure procedure left leg; RF ablation of GSV in both legs    Family History  Problem Relation Age of Onset  . Ovarian cancer Mother     Social History Social History  Substance Use Topics  . Smoking status: Former Smoker    Years: 30.00    Quit date: 04/10/1988  . Smokeless tobacco: Never Used  . Alcohol use 0.0 oz/week  Comment: once in a while    Allergies  Allergen Reactions  . Balsam [place]-Castor [Amberderm] Other (See Comments)    Unknown  [facility] to [facility] - positive patch test   . Carafate [Sucralfate] Other (See Comments)    Unknown   . Dmdm Hydantoin Other (See Comments)    Unknown  According to [facility] - positive patch tes  . Lisinopril Swelling  . Peanut-Containing Drug Products Swelling  . Pepcid [Famotidine] Other (See Comments)    Unknown   . Silver Other (See Comments)    Other reaction(s): Other (See Comments) blisters blisters  . Tape Itching    Redness   . Zantac [Ranitidine Hcl] Hives  . Xanax [Alprazolam] Rash    Current Outpatient Prescriptions  Medication Sig Dispense Refill  . acetaminophen (TYLENOL) 500 MG tablet Take 500 mg by mouth 2 (two)  times daily as needed for mild pain.    Marland Kitchen amLODipine (NORVASC) 10 MG tablet Take 5 mg by mouth daily.    . APPLE CIDER VINEGAR PO Take 450 mg by mouth daily.    Marland Kitchen aspirin 81 MG tablet Take 81 mg by mouth daily.    Marland Kitchen atorvastatin (LIPITOR) 40 MG tablet Take 1 tablet by mouth at bedtime.     . Biotin 5000 MCG CAPS Take 5,000 mcg by mouth daily.    . Cholecalciferol (VITAMIN D3) 2000 units capsule Take 2,000 Units by mouth daily.    . clonazePAM (KLONOPIN) 0.5 MG tablet Take 0.5 mg by mouth 2 (two) times daily as needed for anxiety.    . fluticasone (FLONASE) 50 MCG/ACT nasal spray Place 2 sprays into both nostrils daily as needed for allergies.     Marland Kitchen gabapentin (NEURONTIN) 300 MG capsule Take 300 mg by mouth 2 (two) times daily.     . Glucosamine HCl (GLUCOSAMINE PO) Take 2,000 mg by mouth daily.    . hydrochlorothiazide (HYDRODIURIL) 12.5 MG tablet Take 1 tablet by mouth daily.    Marland Kitchen ipratropium (ATROVENT HFA) 17 MCG/ACT inhaler Inhale 2 puffs into the lungs every 6 (six) hours as needed for wheezing.     Marland Kitchen losartan (COZAAR) 100 MG tablet Take 1 tablet by mouth at bedtime.     . Magnesium 250 MG TABS Take 250 mg by mouth daily.    . metFORMIN (GLUCOPHAGE-XR) 750 MG 24 hr tablet Take 750 mg by mouth 2 (two) times daily.     . metoprolol succinate (TOPROL-XL) 100 MG 24 hr tablet Take 50 mg by mouth at bedtime. Take with or immediately following a meal.    . omeprazole (PRILOSEC) 40 MG capsule Take 40 mg by mouth daily.     No current facility-administered medications for this visit.     Review of Systems Review of Systems  Constitutional: Negative.   Respiratory: Negative.   Cardiovascular: Negative.     Blood pressure 140/80, pulse 67, resp. rate 13, height 5' (1.524 m), weight 213 lb (96.6 kg).  Physical Exam Physical Exam  Constitutional: She is oriented to person, place, and time. She appears well-developed and well-nourished.  Eyes: Conjunctivae are normal. No scleral icterus.   Neck: Neck supple.  Cardiovascular: Normal rate, regular rhythm, normal heart sounds and intact distal pulses.   Pulmonary/Chest: Effort normal and breath sounds normal.     Right breast exhibits tenderness. Right breast exhibits no inverted nipple, no mass, no nipple discharge and no skin change. Left breast exhibits no inverted nipple, no mass, no nipple discharge, no skin change  and no tenderness.    Abdominal: Soft. Normal appearance and bowel sounds are normal. There is no hepatomegaly. There is no tenderness. No hernia.  Lymphadenopathy:    She has no cervical adenopathy.    She has no axillary adenopathy.  Neurological: She is alert and oriented to person, place, and time.  Skin: Skin is warm and dry.  Psychiatric: She has a normal mood and affect. Her behavior is normal.    Data Reviewed Prior notes and mammogram reviewed.  Assessment    History of left breast DCIS, s/p lumpectomy/radiation in 2010- doing well, no complaints History of right lung cancer, s/p right upper lobectomy in 1989 Exam is stable.     Plan     Patient will be asked to return to the office in one year with a bilateral screening mammogram with Dr. Bary Castilla.    HPI, Physical Exam, Assessment and Plan have been scribed under the direction and in the presence of Mckinley Jewel, MD  Gaspar Cola, CMA  I have completed the exam and reviewed the above documentation for accuracy and completeness.  I agree with the above.  Haematologist has been used and any errors in dictation or transcription are unintentional.  Brisa Auth G. Jamal Collin, M.D., F.A.C.S.   Junie Panning G 09/21/2016, 9:52 AM

## 2016-09-21 NOTE — Patient Instructions (Addendum)
Patient will be asked to return to the office in one year with a bilateral screening mammogram with Dr. Byrnett.  

## 2016-10-03 ENCOUNTER — Encounter: Payer: Self-pay | Admitting: *Deleted

## 2016-10-03 ENCOUNTER — Encounter
Admission: RE | Disposition: A | Discharge status: Home / Self Care | Payer: Self-pay | Source: Ambulatory Visit | Attending: Ophthalmology

## 2016-10-03 ENCOUNTER — Ambulatory Visit: Payer: PPO | Admitting: Anesthesiology

## 2016-10-03 ENCOUNTER — Ambulatory Visit
Admission: RE | Admit: 2016-10-03 | Discharge: 2016-10-03 | Disposition: A | Discharge status: Home / Self Care | Payer: PPO | Source: Ambulatory Visit | Attending: Ophthalmology | Admitting: Ophthalmology

## 2016-10-03 DIAGNOSIS — Z79899 Other long term (current) drug therapy: Secondary | ICD-10-CM | POA: Diagnosis not present

## 2016-10-03 DIAGNOSIS — G4730 Sleep apnea, unspecified: Secondary | ICD-10-CM | POA: Diagnosis not present

## 2016-10-03 DIAGNOSIS — K449 Diaphragmatic hernia without obstruction or gangrene: Secondary | ICD-10-CM | POA: Diagnosis not present

## 2016-10-03 DIAGNOSIS — H2512 Age-related nuclear cataract, left eye: Secondary | ICD-10-CM | POA: Diagnosis not present

## 2016-10-03 DIAGNOSIS — E669 Obesity, unspecified: Secondary | ICD-10-CM | POA: Diagnosis not present

## 2016-10-03 DIAGNOSIS — Z6841 Body Mass Index (BMI) 40.0 and over, adult: Secondary | ICD-10-CM | POA: Diagnosis not present

## 2016-10-03 DIAGNOSIS — E1136 Type 2 diabetes mellitus with diabetic cataract: Secondary | ICD-10-CM | POA: Diagnosis not present

## 2016-10-03 DIAGNOSIS — I739 Peripheral vascular disease, unspecified: Secondary | ICD-10-CM | POA: Diagnosis not present

## 2016-10-03 DIAGNOSIS — E119 Type 2 diabetes mellitus without complications: Secondary | ICD-10-CM | POA: Diagnosis not present

## 2016-10-03 DIAGNOSIS — Z7984 Long term (current) use of oral hypoglycemic drugs: Secondary | ICD-10-CM | POA: Diagnosis not present

## 2016-10-03 DIAGNOSIS — K219 Gastro-esophageal reflux disease without esophagitis: Secondary | ICD-10-CM | POA: Diagnosis not present

## 2016-10-03 DIAGNOSIS — F329 Major depressive disorder, single episode, unspecified: Secondary | ICD-10-CM | POA: Diagnosis not present

## 2016-10-03 DIAGNOSIS — I252 Old myocardial infarction: Secondary | ICD-10-CM | POA: Diagnosis not present

## 2016-10-03 DIAGNOSIS — Z87891 Personal history of nicotine dependence: Secondary | ICD-10-CM | POA: Diagnosis not present

## 2016-10-03 DIAGNOSIS — I10 Essential (primary) hypertension: Secondary | ICD-10-CM | POA: Diagnosis not present

## 2016-10-03 DIAGNOSIS — J449 Chronic obstructive pulmonary disease, unspecified: Secondary | ICD-10-CM | POA: Insufficient documentation

## 2016-10-03 HISTORY — PX: CATARACT EXTRACTION W/PHACO: SHX586

## 2016-10-03 LAB — GLUCOSE, CAPILLARY: GLUCOSE-CAPILLARY: 120 mg/dL — AB (ref 65–99)

## 2016-10-03 SURGERY — PHACOEMULSIFICATION, CATARACT, WITH IOL INSERTION
Anesthesia: Monitor Anesthesia Care | Site: Eye | Laterality: Left | Wound class: Clean

## 2016-10-03 MED ORDER — EPINEPHRINE PF 1 MG/ML IJ SOLN
INTRAOCULAR | Status: DC | PRN
  Administered 2016-10-03: 1 mL via OPHTHALMIC

## 2016-10-03 MED ORDER — FENTANYL CITRATE (PF) 100 MCG/2ML IJ SOLN: 25.0000 ug | INTRAMUSCULAR | Status: DC | PRN

## 2016-10-03 MED ORDER — POVIDONE-IODINE 5 % OP SOLN
OPHTHALMIC | Status: DC | PRN
  Administered 2016-10-03: 1 via OPHTHALMIC

## 2016-10-03 MED ORDER — ARMC OPHTHALMIC DILATING DROPS
OPHTHALMIC | Status: AC
  Administered 2016-10-03: 1 via OPHTHALMIC
  Filled 2016-10-03: qty 0.4

## 2016-10-03 MED ORDER — MIDAZOLAM HCL 2 MG/2ML IJ SOLN
INTRAMUSCULAR | Status: DC | PRN
Start: 2016-10-03 — End: 2016-10-03

## 2016-10-03 MED ORDER — MOXIFLOXACIN HCL 0.5 % OP SOLN
OPHTHALMIC | Status: DC | PRN
  Administered 2016-10-03: .2 mL via OPHTHALMIC

## 2016-10-03 MED ORDER — SODIUM CHLORIDE 0.9 % IV SOLN
INTRAVENOUS | Status: DC
  Administered 2016-10-03: 10:00:00 via INTRAVENOUS

## 2016-10-03 MED ORDER — CARBACHOL 0.01 % IO SOLN
INTRAOCULAR | Status: DC | PRN
  Administered 2016-10-03: .5 mL via INTRAOCULAR

## 2016-10-03 MED ORDER — NA CHONDROIT SULF-NA HYALURON 40-17 MG/ML IO SOLN
INTRAOCULAR | Status: DC | PRN
  Administered 2016-10-03: 1 mL via INTRAOCULAR

## 2016-10-03 MED ORDER — LIDOCAINE HCL (PF) 4 % IJ SOLN
INTRAMUSCULAR | Status: AC
  Filled 2016-10-03: qty 5

## 2016-10-03 MED ORDER — LIDOCAINE HCL (PF) 4 % IJ SOLN
INTRAOCULAR | Status: DC | PRN
  Administered 2016-10-03: 2 mL via OPHTHALMIC

## 2016-10-03 MED ORDER — ONDANSETRON HCL 4 MG/2ML IJ SOLN: 4.0000 mg | Freq: Once | INTRAMUSCULAR | Status: DC | PRN

## 2016-10-03 MED ORDER — ARMC OPHTHALMIC DILATING DROPS
1.0000 "application " | OPHTHALMIC | Status: AC
  Administered 2016-10-03 (×3): 1 via OPHTHALMIC

## 2016-10-03 MED ORDER — MIDAZOLAM HCL 2 MG/2ML IJ SOLN
INTRAMUSCULAR | Status: AC
Start: 2016-10-03 — End: ?
  Filled 2016-10-03: qty 2

## 2016-10-03 MED ORDER — NA CHONDROIT SULF-NA HYALURON 40-17 MG/ML IO SOLN
INTRAOCULAR | Status: AC
  Filled 2016-10-03: qty 1

## 2016-10-03 MED ORDER — MIDAZOLAM HCL 2 MG/2ML IJ SOLN
INTRAMUSCULAR | Status: DC | PRN
  Administered 2016-10-03 (×2): 1 mg via INTRAVENOUS

## 2016-10-03 MED ORDER — POVIDONE-IODINE 5 % OP SOLN
OPHTHALMIC | Status: AC
  Filled 2016-10-03: qty 30

## 2016-10-03 MED ORDER — EPINEPHRINE PF 1 MG/ML IJ SOLN
INTRAMUSCULAR | Status: AC
  Filled 2016-10-03: qty 2

## 2016-10-03 MED ORDER — MOXIFLOXACIN HCL 0.5 % OP SOLN
OPHTHALMIC | Status: AC
  Filled 2016-10-03: qty 3

## 2016-10-03 MED ORDER — MOXIFLOXACIN HCL 0.5 % OP SOLN: 1.0000 [drp] | OPHTHALMIC | Status: DC | PRN

## 2016-10-03 SURGICAL SUPPLY — 14 items
GLOVE BIO SURGEON STRL SZ8 (GLOVE) ×3 IMPLANT
GLOVE BIOGEL M 6.5 STRL (GLOVE) ×3 IMPLANT
GLOVE SURG LX 8.0 MICRO (GLOVE) ×2
GLOVE SURG LX STRL 8.0 MICRO (GLOVE) ×1 IMPLANT
GOWN STRL REUS W/ TWL LRG LVL3 (GOWN DISPOSABLE) ×2 IMPLANT
GOWN STRL REUS W/TWL LRG LVL3 (GOWN DISPOSABLE) ×4
LENS IOL TECNIS ITEC 25.0 (Intraocular Lens) ×3 IMPLANT
PACK CATARACT (MISCELLANEOUS) ×3 IMPLANT
PACK CATARACT BRASINGTON LX (MISCELLANEOUS) ×3 IMPLANT
SOL BSS BAG (MISCELLANEOUS) ×3
SOLUTION BSS BAG (MISCELLANEOUS) ×1 IMPLANT
SYR 5ML LL (SYRINGE) ×3 IMPLANT
WATER STERILE IRR 250ML POUR (IV SOLUTION) ×3 IMPLANT
WIPE NON LINTING 3.25X3.25 (MISCELLANEOUS) ×3 IMPLANT

## 2016-10-03 NOTE — Transfer of Care (Signed)
Immediate Anesthesia Transfer of Care Note  Patient: Kathryn Hammond  Procedure(s) Performed: Procedure(s) with comments: CATARACT EXTRACTION PHACO AND INTRAOCULAR LENS PLACEMENT (IOC) (Left) - [place] 00:37 AP% 20.6 CDE 7.69 Fluid pack lot # [id number]  Patient Location: PACU  Anesthesia Type:MAC  Level of Consciousness: awake and alert   Airway & Oxygen Therapy: Patient Spontanous Breathing  Post-op Assessment: Report given to RN and Post -op Vital signs reviewed and stable  Post vital signs: Reviewed and stable  Last Vitals:  Vitals:   10/03/16 0937  BP: (!) 160/65  Pulse: (!) 56  Resp: 16  Temp: 36.6 C    Last Pain:  Vitals:   10/03/16 0937  TempSrc: Tympanic  PainSc: 0-No pain         Complications: No apparent anesthesia complications

## 2016-10-03 NOTE — Anesthesia Procedure Notes (Signed)
Date/Time: 10/03/2016 11:26 AM Performed by: Nelda Marseille Pre-anesthesia Checklist: Patient identified, Emergency Drugs available, Suction available, Patient being monitored and Timeout performed Oxygen Delivery Method: Nasal cannula

## 2016-10-03 NOTE — Anesthesia Preprocedure Evaluation (Signed)
Anesthesia Evaluation  Patient identified by MRN, date of birth, ID band Patient awake    Reviewed: Allergy & Precautions, NPO status , Patient's Chart, lab work & pertinent test results  Airway Mallampati: III       Dental  (+) Teeth Intact   Pulmonary shortness of breath, sleep apnea , COPD, former smoker,     + decreased breath sounds      Cardiovascular hypertension, Pt. on home beta blockers + angina + Past MI and + Peripheral Vascular Disease   Rhythm:Regular     Neuro/Psych Depression    GI/Hepatic Neg liver ROS, hiatal hernia, GERD  Medicated,  Endo/Other  diabetes, Type 2, Oral Hypoglycemic Agents  Renal/GU negative Renal ROS     Musculoskeletal   Abdominal (+) + obese,   Peds  Hematology   Anesthesia Other Findings   Reproductive/Obstetrics                             Anesthesia Physical Anesthesia Plan  ASA: III  Anesthesia Plan: MAC   Post-op Pain Management:    Induction: Intravenous  PONV Risk Score and Plan:   Airway Management Planned: Natural Airway and Nasal Cannula  Additional Equipment:   Intra-op Plan:   Post-operative Plan:   Informed Consent: I have reviewed the patients History and Physical, chart, labs and discussed the procedure including the risks, benefits and alternatives for the proposed anesthesia with the patient or authorized representative who has indicated his/her understanding and acceptance.     Plan Discussed with: CRNA  Anesthesia Plan Comments:         Anesthesia Quick Evaluation

## 2016-10-03 NOTE — Anesthesia Postprocedure Evaluation (Signed)
Anesthesia Post Note  Patient: LIDYA MCCALISTER  Procedure(s) Performed: Procedure(s) (LRB): CATARACT EXTRACTION PHACO AND INTRAOCULAR LENS PLACEMENT (IOC) (Left)  Patient location during evaluation: PACU Anesthesia Type: MAC Level of consciousness: awake Pain management: pain level controlled Vital Signs Assessment: post-procedure vital signs reviewed and stable Respiratory status: spontaneous breathing Cardiovascular status: stable Anesthetic complications: no     Last Vitals:  Vitals:   10/03/16 1131 10/03/16 1137  BP: 134/66 139/68  Pulse: (!) 54   Resp: 16   Temp:      Last Pain:  Vitals:   10/03/16 0937  TempSrc: Tympanic  PainSc: 0-No pain                 VAN STAVEREN,Minsa Weddington

## 2016-10-03 NOTE — Op Note (Signed)
PREOPERATIVE DIAGNOSIS:  Nuclear sclerotic cataract of the left eye.   POSTOPERATIVE DIAGNOSIS:  Nuclear sclerotic cataract of the left eye.   OPERATIVE PROCEDURE: Procedure(s): CATARACT EXTRACTION PHACO AND INTRAOCULAR LENS PLACEMENT (IOC)   SURGEON:  Birder Robson, MD.   ANESTHESIA:  Anesthesiologist: [place] [facility], Jane Canary, MD CRNA: Nelda Marseille, CRNA; Jonna Clark, CRNA  1.      Managed anesthesia care. 2.     0.61ml of Shugarcaine was instilled following the paracentesis   COMPLICATIONS:  None.   TECHNIQUE:   Stop and chop   DESCRIPTION OF PROCEDURE:  The patient was examined and consented in the preoperative holding area where the aforementioned topical anesthesia was applied to the left eye and then brought back to the Operating Room where the left eye was prepped and draped in the usual sterile ophthalmic fashion and a lid speculum was placed. A paracentesis was created with the side port blade and the anterior chamber was filled with viscoelastic. A near clear corneal incision was performed with the steel keratome. A continuous curvilinear capsulorrhexis was performed with a cystotome followed by the capsulorrhexis forceps. Hydrodissection and hydrodelineation were carried out with BSS on a blunt cannula. The lens was removed in a stop and chop  technique and the remaining cortical material was removed with the irrigation-aspiration handpiece. The capsular bag was inflated with viscoelastic and the Technis [id number] lens was placed in the capsular bag without complication. The remaining viscoelastic was removed from the eye with the irrigation-aspiration handpiece. The wounds were hydrated. The anterior chamber was flushed with Miostat and the eye was inflated to physiologic pressure. 0.7ml Vigamox was placed in the anterior chamber. The wounds were found to be water tight. The eye was dressed with Vigamox. The patient was given protective glasses to wear throughout the day and a  shield with which to sleep tonight. The patient was also given drops with which to begin a drop regimen today and will follow-up with me in one day.  Implant Name Type Inv. Item Serial No. Manufacturer Lot No. LRB No. Used  LENS IOL DIOP 25.0 - [id number] Intraocular Lens LENS IOL DIOP 25.0 [id number] AMO   Left 1    Procedure(s) with comments: CATARACT EXTRACTION PHACO AND INTRAOCULAR LENS PLACEMENT (IOC) (Left) - [place] 00:37 AP% 20.6 CDE 7.69 Fluid pack lot # [id number]  Electronically signed: [place] 10/03/2016 11:29 AM

## 2016-10-03 NOTE — H&P (Signed)
All labs reviewed. Abnormal studies sent to patients PCP when indicated.  Previous H&P reviewed, patient examined, there are NO CHANGES.  Kathryn Hammond LOUIS6/26/201811:05 AM

## 2016-10-03 NOTE — Anesthesia Post-op Follow-up Note (Cosign Needed)
Anesthesia QCDR form completed.        

## 2016-10-03 NOTE — Discharge Instructions (Signed)
Eye Surgery Discharge Instructions  Expect mild scratchy sensation or mild soreness. DO NOT RUB YOUR EYE!  The day of surgery:  Minimal physical activity, but bed rest is not required  No reading, computer work, or close hand work  No bending, lifting, or straining.  May watch TV  For 24 hours:  No driving, legal decisions, or alcoholic beverages  Safety precautions  Eat anything you prefer: It is better to start with liquids, then soup then solid foods.  _____ Eye patch should be worn until postoperative exam tomorrow.  ____ Solar shield eyeglasses should be worn for comfort in the sunlight/patch while sleeping  Resume all regular medications including aspirin or Coumadin if these were discontinued prior to surgery. You may shower, bathe, shave, or wash your hair. Tylenol may be taken for mild discomfort.  Call your doctor if you experience significant pain, nausea, or vomiting, fever > 101 or other signs of infection. [phone] or [phone] Specific instructions:  Follow-up Information    Birder Robson, MD Follow up.   Specialty:  Ophthalmology Why:  June 27 at 9:10am Contact information: [address] [place] [address] [phone]         AMBULATORY SURGERY  DISCHARGE INSTRUCTIONS   1) The drugs that you were given will stay in your system until tomorrow so for the next 24 hours you should not:  A) Drive an automobile B) Make any legal decisions C) Drink any alcoholic beverage   2) You may resume regular meals tomorrow.  Today it is better to start with liquids and gradually work up to solid foods.  You may eat anything you prefer, but it is better to start with liquids, then soup and crackers, and gradually work up to solid foods.   3) Please notify your doctor immediately if you have any unusual bleeding, trouble breathing, redness and pain at the surgery site, drainage, fever, or pain not relieved by  medication.    4) Additional Instructions:        Please contact your physician with any problems or Same Day Surgery at [phone], Monday through Friday 6 am to 4 pm, or [place] at [facility] number at [phone].

## 2016-10-04 ENCOUNTER — Encounter: Payer: Self-pay | Admitting: Ophthalmology

## 2016-10-20 DIAGNOSIS — I872 Venous insufficiency (chronic) (peripheral): Secondary | ICD-10-CM | POA: Diagnosis not present

## 2016-11-06 DIAGNOSIS — K219 Gastro-esophageal reflux disease without esophagitis: Secondary | ICD-10-CM | POA: Diagnosis not present

## 2016-11-06 DIAGNOSIS — E538 Deficiency of other specified B group vitamins: Secondary | ICD-10-CM | POA: Diagnosis not present

## 2016-11-06 DIAGNOSIS — E119 Type 2 diabetes mellitus without complications: Secondary | ICD-10-CM | POA: Diagnosis not present

## 2016-11-06 DIAGNOSIS — Z794 Long term (current) use of insulin: Secondary | ICD-10-CM | POA: Diagnosis not present

## 2016-11-06 DIAGNOSIS — E782 Mixed hyperlipidemia: Secondary | ICD-10-CM | POA: Diagnosis not present

## 2016-11-06 DIAGNOSIS — I10 Essential (primary) hypertension: Secondary | ICD-10-CM | POA: Diagnosis not present

## 2016-12-01 DIAGNOSIS — G4733 Obstructive sleep apnea (adult) (pediatric): Secondary | ICD-10-CM | POA: Diagnosis not present

## 2016-12-01 DIAGNOSIS — Z961 Presence of intraocular lens: Secondary | ICD-10-CM | POA: Diagnosis not present

## 2016-12-07 ENCOUNTER — Encounter: Payer: Self-pay | Admitting: General Surgery

## 2016-12-07 ENCOUNTER — Ambulatory Visit (INDEPENDENT_AMBULATORY_CARE_PROVIDER_SITE_OTHER): Payer: PPO | Admitting: General Surgery

## 2016-12-07 VITALS — BP 132/76 | HR 72 | Resp 12 | Ht 60.0 in | Wt 209.0 lb

## 2016-12-07 DIAGNOSIS — D171 Benign lipomatous neoplasm of skin and subcutaneous tissue of trunk: Secondary | ICD-10-CM | POA: Diagnosis not present

## 2016-12-07 NOTE — Progress Notes (Signed)
Patient ID: Kathryn Hammond, female   DOB: 1940/06/13, 76 y.o.   MRN: [id number]  Chief Complaint  Patient presents with  . Other    HPI Kathryn Hammond is a 76 y.o. femaleHere today for a evaluation of a lipoma on on left side. She states she noticed this over two year now. The area started hurting over a year.  HPI  Past Medical History:  Diagnosis Date  . Arthritis   . Asthma   . CPAP (continuous positive airway pressure) dependence 15 years  . Depression   . Dermatitis   . Diabetes ([place])    non-insulin dependent  . Dyspnea   . Emphysema   . Fatty liver disease, nonalcoholic   . GERD (gastroesophageal reflux disease)   . History of hiatal hernia   . Hyperlipidemia   . Lung cancer ([place])   . Melanoma ([place])    right temple  . Neuromuscular disorder (H[facility])    neuropathy  . Obesity, unspecified   . Personal history of malignant neoplasm of breast    left breast DCIS  . Personal history of malignant neoplasm of bronchus and lung    right lung cancer  . Personal history of tobacco use, presenting hazards to health   . Reflux esophagitis   . Screening for obesity   . Seasonal allergies   . Sleep apnea    cpap  . Ulcer    peptic  . Unspecified essential hypertension   . Varicose veins of lower extremities with other complications     Past Surgical History:  Procedure Laterality Date  . ABDOMINAL HYSTERECTOMY  1972  . BREAST BIOPSY Right   . BREAST EXCISIONAL BIOPSY Left 2010   DCIS  . BREAST LUMPECTOMY Left 2010   R for DCIS  . BREAST SURGERY Left 2010   mammosite balloon placement and removal  . CATARACT EXTRACTION W/PHACO Right 09/12/2016   Procedure: CATARACT EXTRACTION PHACO AND INTRAOCULAR LENS PLACEMENT (IOC);  Surgeon: Birder Robson, MD;  Location: [facility] ORS;  Service: Ophthalmology;  Laterality: Right;  [place] 00:38.2 AP% 10.9 CDE 4.16 Fluid Pack lot # [id number]  . CATARACT EXTRACTION W/PHACO Left 10/03/2016   Procedure: CATARACT EXTRACTION PHACO AND  INTRAOCULAR LENS PLACEMENT (IOC);  Surgeon: Birder Robson, MD;  Location: [facility] ORS;  Service: Ophthalmology;  Laterality: Left;  [place] 00:37 AP% 20.6 CDE 7.69 Fluid pack lot # [id number]  . COLONOSCOPY  2007 and 2012   Dr. Vira Agar at [facility]; polyps  . COLONOSCOPY WITH PROPOFOL N/A 10/28/2015   Procedure: COLONOSCOPY WITH PROPOFOL;  Surgeon: Manya Silvas, MD;  Location[facility] END[facility]COPY;  Service: Endoscopy;  Laterality: N/A;  . ESOPHAGOGASTRODUODENOSCOPY (EGD) WITH PROPOFOL N/A 10/28/2015   Procedure: ESOPHAGOGASTRODUODENOSCOPY (EGD) WITH PROPOFOL;  Surgeon: Manya Silvas, MD;  Location: [facility] ENDOSCOPY;  Service: Endoscopy;  Laterality: N/A;  . EYE SURGERY  1992  . HERNIA REPAIR  1990  . KNEE SURGERY  1999   knee replaced  . KNEE SURGERY  1977   knees replaced  . LOBECTOMY  1989  . MELANOMA EXCISION  December 07, 2009   right temple  . MOHS SURGERY  2016   [facility], nose  . SALPINGOOPHORECTOMY  1972  . VEIN SURGERY  September 2011   vein closure procedure left leg; RF ablation of GSV in both legs    Family History  Problem Relation Age of Onset  . Ovarian cancer Mother     Social History Social History  Substance Use Topics  . Smoking  status: Former Smoker    Years: 30.00    Quit date: 04/10/1988  . Smokeless tobacco: Never Used  . Alcohol use 0.0 oz/week     Comment: once in a while    Allergies  Allergen Reactions  . Balsam [place]-Castor [Amberderm] Other (See Comments)    Unknown  [facility] to [facility] - positive patch test   . Carafate [Sucralfate] Other (See Comments)    Unknown   . Dmdm Hydantoin Other (See Comments)    Unknown  [facility] to [facility] - positive patch tes  . Lisinopril Swelling  . Peanut-Containing Drug Products Swelling  . Pepcid [Famotidine] Other (See Comments)    Unknown   . Silver Other (See Comments)    Other reaction(s): Other (See Comments) blisters blisters  . Tape Itching    Redness   . Zantac [Ranitidine Hcl] Hives  . Xanax [Alprazolam]  Rash    Current Outpatient Prescriptions  Medication Sig Dispense Refill  . acetaminophen (TYLENOL) 500 MG tablet Take 500 mg by mouth 2 (two) times daily as needed for mild pain.    Marland Kitchen amLODipine (NORVASC) 10 MG tablet Take 5 mg by mouth daily.    . APPLE CIDER VINEGAR PO Take 450 mg by mouth daily.    Marland Kitchen aspirin 81 MG tablet Take 81 mg by mouth daily.    Marland Kitchen atorvastatin (LIPITOR) 40 MG tablet Take 1 tablet by mouth at bedtime.     . Biotin 5000 MCG CAPS Take 5,000 mcg by mouth daily.    . Cholecalciferol (VITAMIN D3) 2000 units capsule Take 2,000 Units by mouth daily.    . clonazePAM (KLONOPIN) 0.5 MG tablet Take 0.5 mg by mouth 2 (two) times daily as needed for anxiety.    . fluticasone (FLONASE) 50 MCG/ACT nasal spray Place 2 sprays into both nostrils daily as needed for allergies.     Marland Kitchen gabapentin (NEURONTIN) 300 MG capsule Take 300 mg by mouth 2 (two) times daily.     . Glucosamine HCl (GLUCOSAMINE PO) Take 2,000 mg by mouth daily.    . hydrochlorothiazide (HYDRODIURIL) 12.5 MG tablet Take 1 tablet by mouth daily.    Marland Kitchen ipratropium (ATROVENT HFA) 17 MCG/ACT inhaler Inhale 2 puffs into the lungs every 6 (six) hours as needed for wheezing.     Marland Kitchen losartan (COZAAR) 100 MG tablet Take 1 tablet by mouth at bedtime.     . Magnesium 250 MG TABS Take 250 mg by mouth daily.    . metFORMIN (GLUCOPHAGE-XR) 750 MG 24 hr tablet Take 750 mg by mouth 2 (two) times daily.     . metoprolol succinate (TOPROL-XL) 100 MG 24 hr tablet Take 50 mg by mouth at bedtime. Take with or immediately following a meal.    . omeprazole (PRILOSEC) 40 MG capsule Take 40 mg by mouth daily.     No current facility-administered medications for this visit.     Review of Systems Review of Systems  Constitutional: Negative.   Respiratory: Negative.   Cardiovascular: Negative.     Blood pressure 132/76, pulse 72, resp. rate 12, height 5' (1.524 m), weight 209 lb (94.8 kg).  Physical Exam Physical Exam  Constitutional:  She is oriented to person, place, and time. She appears well-developed and well-nourished.  Neurological: She is alert and oriented to person, place, and time.  Skin: Skin is warm and dry.       Data Reviewed Prior notes reviewed   Assessment    Lipoma left posterior lateral chest  wall symptomatic      Plan   Discussed with patient and advised excision which can be done here in the office. Patient is agreeable Patient to return for excision left flank lipoma.  HPI, Physical Exam, Assessment and Plan have been scribed under the direction and in the presence of Mckinley Jewel, MD  Gaspar Cola, CMA     I have completed the exam and reviewed the above documentation for accuracy and completeness.  I agree with the above.  Haematologist has been used and any errors in dictation or transcription are unintentional.  Kayal Mula Hammond. Jamal Collin, M.D., F.A.C.S.    Kathryn Hammond 12/07/2016, 10:46 AM

## 2016-12-07 NOTE — Patient Instructions (Signed)
Patient to return for excision left flank lipoma.

## 2016-12-14 ENCOUNTER — Encounter: Payer: Self-pay | Admitting: General Surgery

## 2016-12-14 ENCOUNTER — Ambulatory Visit (INDEPENDENT_AMBULATORY_CARE_PROVIDER_SITE_OTHER): Payer: PPO | Admitting: General Surgery

## 2016-12-14 VITALS — BP 140/80 | HR 68 | Resp 13 | Ht 60.0 in | Wt 214.0 lb

## 2016-12-14 DIAGNOSIS — D171 Benign lipomatous neoplasm of skin and subcutaneous tissue of trunk: Secondary | ICD-10-CM

## 2016-12-14 NOTE — Patient Instructions (Addendum)
Return as needed

## 2016-12-14 NOTE — Progress Notes (Signed)
Patient ID: Kathryn Hammond, female   DOB: 1940/05/22, 76 y.o.   MRN: [id number]  Chief Complaint  Patient presents with  . Procedure    HPI Kathryn Hammond is a 76 y.o. female here today for excision chest wall mass.  HPI  Past Medical History:  Diagnosis Date  . Arthritis   . Asthma   . CPAP (continuous positive airway pressure) dependence 15 years  . Depression   . Dermatitis   . Diabetes ([place])    non-insulin dependent  . Dyspnea   . Emphysema   . Fatty liver disease, nonalcoholic   . GERD (gastroesophageal reflux disease)   . History of hiatal hernia   . Hyperlipidemia   . Lung cancer ([place])   . Melanoma ([place])    right temple  . Neuromuscular disorder (H[facility])    neuropathy  . Obesity, unspecified   . Personal history of malignant neoplasm of breast    left breast DCIS  . Personal history of malignant neoplasm of bronchus and lung    right lung cancer  . Personal history of tobacco use, presenting hazards to health   . Reflux esophagitis   . Screening for obesity   . Seasonal allergies   . Sleep apnea    cpap  . Ulcer    peptic  . Unspecified essential hypertension   . Varicose veins of lower extremities with other complications     Past Surgical History:  Procedure Laterality Date  . ABDOMINAL HYSTERECTOMY  1972  . BREAST BIOPSY Right   . BREAST EXCISIONAL BIOPSY Left 2010   DCIS  . BREAST LUMPECTOMY Left 2010   R for DCIS  . BREAST SURGERY Left 2010   mammosite balloon placement and removal  . CATARACT EXTRACTION W/PHACO Right 09/12/2016   Procedure: CATARACT EXTRACTION PHACO AND INTRAOCULAR LENS PLACEMENT (IOC);  Surgeon: Birder Robson, MD;  Location: [facility] ORS;  Service: Ophthalmology;  Laterality: Right;  [place] 00:38.2 AP% 10.9 CDE 4.16 Fluid Pack lot # [id number]  . CATARACT EXTRACTION W/PHACO Left 10/03/2016   Procedure: CATARACT EXTRACTION PHACO AND INTRAOCULAR LENS PLACEMENT (IOC);  Surgeon: Birder Robson, MD;  Location: [facility] ORS;  Service:  Ophthalmology;  Laterality: Left;  [place] 00:37 AP% 20.6 CDE 7.69 Fluid pack lot # [id number]  . COLONOSCOPY  2007 and 2012   Dr. [facility]ar at [facility]; polyps  . COLONOSCOPY WITH PROPOFOL N/A 10/28/2015   Procedure: COLONOSCOPY WITH PROPOFOL;  Surgeon: Manya Silvas, MD;  [facility] ENDOSCOPY;  Service: Endoscopy;  Laterality: N/A;  . ESOPHAGOGASTRODUODENOSCOPY (EGD) WITH PROPOFOL N/A 10/28/2015   Procedure: ESOPHAGOGASTRODUODENOSCOPY (EGD) WITH PROPOFOL;  Surgeon: Manya Silvas, MD;  Location: [facility] ENDOSCOPY;  Service: Endoscopy;  Laterality: N/A;  . EYE SURGERY  1992  . HERNIA REPAIR  1990  . KNEE SURGERY  1999   knee replaced  . KNEE SURGERY  1977   knees replaced  . LOBECTOMY  1989  . MELANOMA EXCISION  December 07, 2009   right temple  . MOHS SURGERY  2016   [facility], nose  . SALPINGOOPHORECTOMY  1972  . VEIN SURGERY  September 2011   vein closure procedure left leg; RF ablation of GSV in both legs    Family History  Problem Relation Age of Onset  . Ovarian cancer Mother     Social History Social History  Substance Use Topics  . Smoking status: Former Smoker    Years: 30.00    Quit date: 04/10/1988  . Smokeless tobacco: Never Used  .  Alcohol use 0.0 oz/week     Comment: once in a while    Allergies  Allergen Reactions  . Balsam [place]-Castor [Amberderm] Other (See Comments)    Unknown  [facility] to [facility] - positive patch test   . Carafate [Sucralfate] Other (See Comments)    Unknown   . Dmdm Hydantoin Other (See Comments)    Unknown  [facility] to [facility] - positive patch tes  . Lisinopril Swelling  . Peanut-Containing Drug Products Swelling  . Pepcid [Famotidine] Other (See Comments)    Unknown   . Silver Other (See Comments)    Other reaction(s): Other (See Comments) blisters blisters  . Tape Itching    Redness   . Zantac [Ranitidine Hcl] Hives  . Xanax [Alprazolam] Rash    Current Outpatient Prescriptions  Medication Sig Dispense Refill  . acetaminophen  (TYLENOL) 500 MG tablet Take 500 mg by mouth 2 (two) times daily as needed for mild pain.    Marland Kitchen amLODipine (NORVASC) 10 MG tablet Take 5 mg by mouth daily.    . APPLE CIDER VINEGAR PO Take 450 mg by mouth daily.    Marland Kitchen aspirin 81 MG tablet Take 81 mg by mouth daily.    Marland Kitchen atorvastatin (LIPITOR) 40 MG tablet Take 1 tablet by mouth at bedtime.     . Biotin 5000 MCG CAPS Take 5,000 mcg by mouth daily.    . Cholecalciferol (VITAMIN D3) 2000 units capsule Take 2,000 Units by mouth daily.    . clonazePAM (KLONOPIN) 0.5 MG tablet Take 0.5 mg by mouth 2 (two) times daily as needed for anxiety.    . fluticasone (FLONASE) 50 MCG/ACT nasal spray Place 2 sprays into both nostrils daily as needed for allergies.     Marland Kitchen gabapentin (NEURONTIN) 300 MG capsule Take 300 mg by mouth 2 (two) times daily.     . Glucosamine HCl (GLUCOSAMINE PO) Take 2,000 mg by mouth daily.    . hydrochlorothiazide (HYDRODIURIL) 12.5 MG tablet Take 1 tablet by mouth daily.    Marland Kitchen ipratropium (ATROVENT HFA) 17 MCG/ACT inhaler Inhale 2 puffs into the lungs every 6 (six) hours as needed for wheezing.     Marland Kitchen losartan (COZAAR) 100 MG tablet Take 1 tablet by mouth at bedtime.     . Magnesium 250 MG TABS Take 250 mg by mouth daily.    . metFORMIN (GLUCOPHAGE-XR) 750 MG 24 hr tablet Take 750 mg by mouth 2 (two) times daily.     . metoprolol succinate (TOPROL-XL) 100 MG 24 hr tablet Take 50 mg by mouth at bedtime. Take with or immediately following a meal.    . omeprazole (PRILOSEC) 40 MG capsule Take 40 mg by mouth daily.     No current facility-administered medications for this visit.     Review of Systems Review of Systems  Constitutional: Negative.   Respiratory: Negative.   Cardiovascular: Negative.     Blood pressure 140/80, pulse 68, resp. rate 13, height 5' (1.524 m), weight 214 lb (97.1 kg).  Physical Exam Physical Exam The palpable lipoma in the left lateral posterior chest chest wall was identified and marked Data  Reviewed Prior notes  Assessment    Lipoma chest wall    Plan    Procedure: Excision lipoma subcutaneous in the left posterior lateral chest wall  Anesthetic: 10 mL of 0.5% Marcaine mixed with 1% Xylocaine  Prep: ChloraPrep  Description: Patient was placed in the right lateral position. After infiltration of local anesthetic overlying the palpable lipoma  the area was prepped with ChloraPrep and draped out with sterile towels. A transverse incision was made overlying the marked area. The subcutaneous Hammond a lobulated lipomatous mass was identified which was freed and removed. It measured approximately 2-1/2 cm to 3 cm. Bleeding controlled with thermal cautery. Subcutaneous Hammond was closed with 3-0 Vicryl. Skin closed with the subcuticular 4-0 Vicryl. Steri-Strips with tincture benzoin applied. Telfa and Tegaderm dressing placed. Procedure was well-tolerated with no immediate problems encountered  Patient advised on wound care. Excised Hammond was sent for pathology     I have completed the exam and reviewed the above documentation for accuracy and completeness.  I agree with the above.  Haematologist has been used and any errors in dictation or transcription are unintentional.  Wash Nienhaus G. Jamal Collin, M.D., F.A.C.S.   Junie Panning G 12/18/2016, 8:52 AM

## 2016-12-18 ENCOUNTER — Telehealth: Payer: Self-pay | Admitting: *Deleted

## 2016-12-18 NOTE — Telephone Encounter (Signed)
-----   Message from Christene Lye, MD sent at 12/18/2016  1:00 PM EDT ----- Please inform pt, path benign lipoma

## 2016-12-18 NOTE — Telephone Encounter (Signed)
Notified patient as instructed, patient pleased. Discussed follow-up appointments, patient agrees  

## 2016-12-19 DIAGNOSIS — L708 Other acne: Secondary | ICD-10-CM | POA: Diagnosis not present

## 2016-12-19 DIAGNOSIS — L821 Other seborrheic keratosis: Secondary | ICD-10-CM | POA: Diagnosis not present

## 2016-12-19 DIAGNOSIS — X32XXXA Exposure to sunlight, initial encounter: Secondary | ICD-10-CM | POA: Diagnosis not present

## 2016-12-19 DIAGNOSIS — Z85828 Personal history of other malignant neoplasm of skin: Secondary | ICD-10-CM | POA: Diagnosis not present

## 2016-12-19 DIAGNOSIS — Z85820 Personal history of malignant melanoma of skin: Secondary | ICD-10-CM | POA: Diagnosis not present

## 2016-12-19 DIAGNOSIS — L570 Actinic keratosis: Secondary | ICD-10-CM | POA: Diagnosis not present

## 2016-12-21 ENCOUNTER — Ambulatory Visit: Payer: PPO

## 2016-12-21 DIAGNOSIS — D171 Benign lipomatous neoplasm of skin and subcutaneous tissue of trunk: Secondary | ICD-10-CM

## 2016-12-21 NOTE — Progress Notes (Addendum)
Patient ID: Kathryn Hammond, female   DOB: 1941/01/16, 76 y.o.   MRN: [id number] Patient came in today for a wound check.  The wound is clean, with no signs of infection noted. Follow up as needed. Patient is aware of her pathology results.

## 2017-01-01 DIAGNOSIS — L255 Unspecified contact dermatitis due to plants, except food: Secondary | ICD-10-CM | POA: Diagnosis not present

## 2017-01-04 DIAGNOSIS — Z23 Encounter for immunization: Secondary | ICD-10-CM | POA: Diagnosis not present

## 2017-01-10 DIAGNOSIS — R0789 Other chest pain: Secondary | ICD-10-CM | POA: Diagnosis not present

## 2017-01-10 DIAGNOSIS — G8929 Other chronic pain: Secondary | ICD-10-CM | POA: Diagnosis not present

## 2017-01-15 ENCOUNTER — Encounter: Payer: Self-pay | Admitting: General Surgery

## 2017-01-15 ENCOUNTER — Ambulatory Visit (INDEPENDENT_AMBULATORY_CARE_PROVIDER_SITE_OTHER): Payer: PPO | Admitting: General Surgery

## 2017-01-15 VITALS — BP 130/74 | HR 80 | Resp 14 | Ht 60.0 in | Wt 214.0 lb

## 2017-01-15 DIAGNOSIS — Z85118 Personal history of other malignant neoplasm of bronchus and lung: Secondary | ICD-10-CM

## 2017-01-15 DIAGNOSIS — Z86000 Personal history of in-situ neoplasm of breast: Secondary | ICD-10-CM

## 2017-01-15 DIAGNOSIS — R0789 Other chest pain: Secondary | ICD-10-CM

## 2017-01-15 NOTE — Progress Notes (Signed)
Patient ID: Kathryn Hammond, female   DOB: 09-Jun-1940, 76 y.o.   MRN: [id number]  Chief Complaint  Patient presents with  . Follow-up    HPI AURIELLA Hammond is a 76 y.o. female here for a re evalaution of a left side nodule. She complains of pain below her left breast and has been applying ice packs. She is also taking prednisone. States it's been hurting "a long time".   HPI  Past Medical History:  Diagnosis Date  . Arthritis   . Asthma   . CPAP (continuous positive airway pressure) dependence 15 years  . Depression   . Dermatitis   . Diabetes ([place])    non-insulin dependent  . Dyspnea   . Emphysema   . Fatty liver disease, nonalcoholic   . GERD (gastroesophageal reflux disease)   . History of hiatal hernia   . Hyperlipidemia   . Lung cancer ([place])   . Melanoma ([place])    right temple  . Neuromuscular disorder (HCC)    neuropathy  . Obesity, unspecified   . Personal history of malignant neoplasm of breast    left breast DCIS  . Personal history of malignant neoplasm of bronchus and lung    right lung cancer  . Personal history of tobacco use, presenting hazards to health   . Reflux esophagitis   . Screening for obesity   . Seasonal allergies   . Sleep apnea    cpap  . Ulcer    peptic  . Unspecified essential hypertension   . Varicose veins of lower extremities with other complications     Past Surgical History:  Procedure Laterality Date  . ABDOMINAL HYSTERECTOMY  1972  . BREAST BIOPSY Right   . BREAST EXCISIONAL BIOPSY Left 2010   DCIS  . BREAST LUMPECTOMY Left 2010   R for DCIS  . BREAST SURGERY Left 2010   mammosite balloon placement and removal  . CATARACT EXTRACTION W/PHACO Right 09/12/2016   Procedure: CATARACT EXTRACTION PHACO AND INTRAOCULAR LENS PLACEMENT (IOC);  Surgeon: Birder Robson, MD;  Location: [facility] ORS;  Service: Ophthalmology;  Laterality: Right;  [place] 00:38.2 AP% 10.9 CDE 4.16 Fluid Pack lot # [id number]  . CATARACT EXTRACTION W/PHACO  Left 10/03/2016   Procedure: CATARACT EXTRACTION PHACO AND INTRAOCULAR LENS PLACEMENT (IOC);  Surgeon: Birder Robson, MD;  Location: [facility] ORS;  Service: Ophthalmology;  Laterality: Left;  [place] 00:37 AP% 20.6 CDE 7.69 Fluid pack lot # [id number]  . COLONOSCOPY  2007 and 2012   [facility] at [facility]; polyps  . COLONOSCOPY WITH PROPOFOL N/A 10/28/2015   Procedure: COLONOSCOPY WITH PROPOFOL;  Surgeon: Manya Silvas, MD;  Location: [facility] ENDOSCOPY;  Service: Endoscopy;  Laterality: N/A;  . ESOPHAGOGASTRODUODENOSCOPY (EGD) WITH PROPOFOL N/A 10/28/2015   Procedure: ESOPHAGOGASTRODUODENOSCOPY (EGD) WITH PROPOFOL;  Surgeon: Manya Silvas, MD;  Location: [facility] ENDOSCOPY;  Service: Endoscopy;  Laterality: N/A;  . EYE SURGERY  1992  . HERNIA REPAIR  1990  . KNEE SURGERY  1999   knee replaced  . KNEE SURGERY  1977   knees replaced  . LOBECTOMY  1989  . MELANOMA EXCISION  December 07, 2009   right temple  . MOHS SURGERY  2016   [facility], nose  . SALPINGOOPHORECTOMY  1972  . VEIN SURGERY  September 2011   vein closure procedure left leg; RF ablation of GSV in both legs    Family History  Problem Relation Age of Onset  . Ovarian cancer Mother     Social  History Social History  Substance Use Topics  . Smoking status: Former Smoker    Years: 30.00    Quit date: 04/10/1988  . Smokeless tobacco: Never Used  . Alcohol use 0.0 oz/week     Comment: once in a while    Allergies  Allergen Reactions  . Balsam [place]-Castor [Amberderm] Other (See Comments)    Unknown  [facility] to [facility] - positive patch test   . Carafate [Sucralfate] Other (See Comments)    Unknown   . Dmdm Hydantoin Other (See Comments)    Unknown  According to [facility] - positive patch tes  . Lisinopril Swelling  . Peanut-Containing Drug Products Swelling  . Pepcid [Famotidine] Other (See Comments)    Unknown   . Silver Other (See Comments)    Other reaction(s): Other (See Comments) blisters blisters  . Tape Itching    Redness     . Zantac [Ranitidine Hcl] Hives  . Xanax [Alprazolam] Rash    Current Outpatient Prescriptions  Medication Sig Dispense Refill  . acetaminophen (TYLENOL) 500 MG tablet Take 500 mg by mouth 2 (two) times daily as needed for mild pain.    Marland Kitchen amLODipine (NORVASC) 10 MG tablet Take 5 mg by mouth daily.    . APPLE CIDER VINEGAR PO Take 450 mg by mouth daily.    Marland Kitchen aspirin 81 MG tablet Take 81 mg by mouth daily.    Marland Kitchen atorvastatin (LIPITOR) 40 MG tablet Take 1 tablet by mouth at bedtime.     . Biotin 5000 MCG CAPS Take 5,000 mcg by mouth daily.    . Cholecalciferol (VITAMIN D3) 2000 units capsule Take 2,000 Units by mouth daily.    . clonazePAM (KLONOPIN) 0.5 MG tablet Take 0.5 mg by mouth 2 (two) times daily as needed for anxiety.    . fluticasone (FLONASE) 50 MCG/ACT nasal spray Place 2 sprays into both nostrils daily as needed for allergies.     Marland Kitchen gabapentin (NEURONTIN) 300 MG capsule Take 300 mg by mouth 2 (two) times daily.     . Glucosamine HCl (GLUCOSAMINE PO) Take 2,000 mg by mouth daily.    . hydrochlorothiazide (HYDRODIURIL) 12.5 MG tablet Take 1 tablet by mouth daily.    Marland Kitchen ipratropium (ATROVENT HFA) 17 MCG/ACT inhaler Inhale 2 puffs into the lungs every 6 (six) hours as needed for wheezing.     Marland Kitchen losartan (COZAAR) 100 MG tablet Take 1 tablet by mouth at bedtime.     . Magnesium 250 MG TABS Take 250 mg by mouth daily.    . metFORMIN (GLUCOPHAGE-XR) 750 MG 24 hr tablet Take 750 mg by mouth 2 (two) times daily.     . metoprolol succinate (TOPROL-XL) 100 MG 24 hr tablet Take 50 mg by mouth at bedtime. Take with or immediately following a meal.    . omeprazole (PRILOSEC) 40 MG capsule Take 40 mg by mouth daily.     No current facility-administered medications for this visit.     Review of Systems Review of Systems  Constitutional: Negative.   Respiratory: Negative.   Cardiovascular: Negative.     Blood pressure 130/74, pulse 80, resp. rate 14, height 5' (1.524 m), weight 214 lb  (97.1 kg).  Physical Exam Physical Exam  Constitutional: She appears well-developed and well-nourished.  Pulmonary/Chest:    Skin: Skin is warm and dry.     Mild excoriation and dry flaking of skin underneath bilateral breast folds  Psychiatric: She has a normal mood and affect. Her behavior is  normal.    Data Reviewed Prior notes and mammogram reviewed  Assessment    Left chest wall pain - patient reports left lateral chest wall pain worse with direct pressure. She recently had a lipoma excision well posterior to that area on 12/14/16 and pathology confirmed it was a lipoma.   History of breast cancer (DCIS) s/p lumpectomy/radiation in 2010 - recent mammogram 09/07/16 was stable.   History of lung cancer s/p right upper lobectomy in 1989 - stable    Plan    Will schedule for CT chest with contrast to evaluate source of pain and patient was agreeable.   HPI, Physical Exam, Assessment and Plan have been scribed under the direction and in the presence of Mckinley Jewel, MD  Gaspar Cola, CMA      I have completed the exam and reviewed the above documentation for accuracy and completeness.  I agree with the above.  Haematologist has been used and any errors in dictation or transcription are unintentional.  Butch Otterson G. Jamal Collin, M.D., F.A.C.S.   Junie Panning G 01/15/2017, 2:00 PM   Patient has been scheduled for a CT chest with contrast at [place] for 01-26-17 at 10:30 am (arrive 10:15 am). Prep: no solids 4 hours prior but patient may have clear liquids up until exam time and pick up prep kit. Patient verbalizes understanding.  Dominga Ferry, CMA

## 2017-01-15 NOTE — Patient Instructions (Signed)
Will schedule for CT chest

## 2017-01-25 ENCOUNTER — Ambulatory Visit
Admission: RE | Admit: 2017-01-25 | Discharge: 2017-01-25 | Disposition: A | Discharge status: Home / Self Care | Payer: PPO | Source: Ambulatory Visit | Attending: General Surgery | Admitting: General Surgery

## 2017-01-25 DIAGNOSIS — I700 Atherosclerosis of aorta: Secondary | ICD-10-CM | POA: Insufficient documentation

## 2017-01-25 DIAGNOSIS — K449 Diaphragmatic hernia without obstruction or gangrene: Secondary | ICD-10-CM | POA: Diagnosis not present

## 2017-01-25 DIAGNOSIS — I2510 Atherosclerotic heart disease of native coronary artery without angina pectoris: Secondary | ICD-10-CM | POA: Insufficient documentation

## 2017-01-25 DIAGNOSIS — Z85118 Personal history of other malignant neoplasm of bronchus and lung: Secondary | ICD-10-CM | POA: Diagnosis not present

## 2017-01-25 DIAGNOSIS — R0789 Other chest pain: Secondary | ICD-10-CM | POA: Diagnosis not present

## 2017-01-25 DIAGNOSIS — Z86000 Personal history of in-situ neoplasm of breast: Secondary | ICD-10-CM | POA: Insufficient documentation

## 2017-01-25 DIAGNOSIS — R079 Chest pain, unspecified: Secondary | ICD-10-CM | POA: Diagnosis not present

## 2017-01-25 LAB — POCT I-STAT CREATININE: CREATININE: 0.9 mg/dL (ref 0.44–1.00)

## 2017-01-25 MED ORDER — IOPAMIDOL (ISOVUE-300) INJECTION 61%
75.0000 mL | Freq: Once | INTRAVENOUS | Status: AC | PRN
  Administered 2017-01-25: 75 mL via INTRAVENOUS

## 2017-01-26 ENCOUNTER — Ambulatory Visit: Payer: PPO

## 2017-02-02 DIAGNOSIS — E119 Type 2 diabetes mellitus without complications: Secondary | ICD-10-CM | POA: Diagnosis not present

## 2017-02-02 DIAGNOSIS — R079 Chest pain, unspecified: Secondary | ICD-10-CM | POA: Diagnosis not present

## 2017-02-02 DIAGNOSIS — E782 Mixed hyperlipidemia: Secondary | ICD-10-CM | POA: Diagnosis not present

## 2017-02-02 DIAGNOSIS — I10 Essential (primary) hypertension: Secondary | ICD-10-CM | POA: Diagnosis not present

## 2017-02-02 DIAGNOSIS — Z794 Long term (current) use of insulin: Secondary | ICD-10-CM | POA: Diagnosis not present

## 2017-02-02 DIAGNOSIS — G4730 Sleep apnea, unspecified: Secondary | ICD-10-CM | POA: Diagnosis not present

## 2017-02-13 DIAGNOSIS — R079 Chest pain, unspecified: Secondary | ICD-10-CM | POA: Diagnosis not present

## 2017-02-22 DIAGNOSIS — Z794 Long term (current) use of insulin: Secondary | ICD-10-CM | POA: Diagnosis not present

## 2017-02-22 DIAGNOSIS — I25118 Atherosclerotic heart disease of native coronary artery with other forms of angina pectoris: Secondary | ICD-10-CM | POA: Diagnosis not present

## 2017-02-22 DIAGNOSIS — I10 Essential (primary) hypertension: Secondary | ICD-10-CM | POA: Diagnosis not present

## 2017-02-22 DIAGNOSIS — E782 Mixed hyperlipidemia: Secondary | ICD-10-CM | POA: Diagnosis not present

## 2017-02-22 DIAGNOSIS — G4730 Sleep apnea, unspecified: Secondary | ICD-10-CM | POA: Diagnosis not present

## 2017-02-22 DIAGNOSIS — E119 Type 2 diabetes mellitus without complications: Secondary | ICD-10-CM | POA: Diagnosis not present

## 2017-02-23 DIAGNOSIS — L304 Erythema intertrigo: Secondary | ICD-10-CM | POA: Diagnosis not present

## 2017-02-28 ENCOUNTER — Ambulatory Visit (INDEPENDENT_AMBULATORY_CARE_PROVIDER_SITE_OTHER): Payer: PPO | Admitting: General Surgery

## 2017-02-28 ENCOUNTER — Encounter: Payer: Self-pay | Admitting: General Surgery

## 2017-02-28 ENCOUNTER — Ambulatory Visit: Payer: PPO | Admitting: General Surgery

## 2017-02-28 VITALS — BP 148/82 | HR 68 | Resp 16 | Ht 60.0 in | Wt 211.0 lb

## 2017-02-28 DIAGNOSIS — R0789 Other chest pain: Secondary | ICD-10-CM

## 2017-02-28 DIAGNOSIS — Z961 Presence of intraocular lens: Secondary | ICD-10-CM | POA: Diagnosis not present

## 2017-02-28 NOTE — Patient Instructions (Signed)
Patient to return as scheduled with Dr.Byrnett

## 2017-02-28 NOTE — Progress Notes (Signed)
Patient ID: Kathryn Hammond, female   DOB: [birth year], 76 y.o.   MRN: [id number]  Chief Complaint  Patient presents with  . Other    HPI Kathryn Hammond is a 76 y.o. female here today to discuss ct scan done on 01/25/2017. Patient states she has some soreness on her left rib only at night.  HPI  Past Medical History:  Diagnosis Date  . Arthritis   . Asthma   . CPAP (continuous positive airway pressure) dependence 15 years  . Depression   . Dermatitis   . Diabetes ([place])    non-insulin dependent  . Dyspnea   . Emphysema   . Fatty liver disease, nonalcoholic   . GERD (gastroesophageal reflux disease)   . History of hiatal hernia   . Hyperlipidemia   . Lung cancer ([place])   . Melanoma ([place])    right temple  . Neuromuscular disorder (HCC)    neuropathy  . Obesity, unspecified   . Personal history of malignant neoplasm of breast    left breast DCIS  . Personal history of malignant neoplasm of bronchus and lung    right lung cancer  . Personal history of tobacco use, presenting hazards to health   . Reflux esophagitis   . Screening for obesity   . Seasonal allergies   . Sleep apnea    cpap  . Ulcer    peptic  . Unspecified essential hypertension   . Varicose veins of lower extremities with other complications     Past Surgical History:  Procedure Laterality Date  . ABDOMINAL HYSTERECTOMY  1972  . BREAST BIOPSY Right   . BREAST EXCISIONAL BIOPSY Left 2010   DCIS  . BREAST LUMPECTOMY Left 2010   R for DCIS  . BREAST SURGERY Left 2010   mammosite balloon placement and removal  . CATARACT EXTRACTION W/PHACO Right 09/12/2016   Procedure: CATARACT EXTRACTION PHACO AND INTRAOCULAR LENS PLACEMENT (IOC);  Surgeon: Birder Robson, MD;  Location: [facility] ORS;  Service: Ophthalmology;  Laterality: Right;  [place] 00:38.2 AP% 10.9 CDE 4.16 Fluid Pack lot # [id number]  . CATARACT EXTRACTION W/PHACO Left 10/03/2016   Procedure: CATARACT EXTRACTION PHACO AND INTRAOCULAR LENS PLACEMENT  (IOC);  Surgeon: Birder Robson, MD;  Location: [facility] ORS;  Service: Ophthalmology;  Laterality: Left;  [place] 00:37 AP% 20.6 CDE 7.69 Fluid pack lot # [id number] H  . COLONOSCOPY  2007 and 2012   Dr[facility] at [facility]; polyps  . COLONOSCOPY WITH PROPOFOL N/A 10/28/2015   Procedure: COLONOSCOPY WITH PROPOFOL;  Surgeon: Manya Silvas, MD;  Location: [facility] ENDOSCOPY;  Service: Endoscopy;  Laterality: N/A;  . ESOPHAGOGASTRODUODENOSCOPY (EGD) WITH PROPOFOL N/A 10/28/2015   Procedure: ESOPHAGOGASTRODUODENOSCOPY (EGD) WITH PROPOFOL;  Surgeon: Manya Silvas, MD;  Location: [facility] ENDOSCOPY;  Service: Endoscopy;  Laterality: N/A;  . EYE SURGERY  1992  . HERNIA REPAIR  1990  . KNEE SURGERY  1999   knee replaced  . KNEE SURGERY  1977   knees replaced  . LOBECTOMY  1989  . MELANOMA EXCISION  December 07, 2009   right temple  . MOHS SURGERY  2016   [facility], nose  . SALPINGOOPHORECTOMY  1972  . VEIN SURGERY  September 2011   vein closure procedure left leg; RF ablation of GSV in both legs    Family History  Problem Relation Age of Onset  . Ovarian cancer Mother     Social History Social History   Tobacco Use  . Smoking status: Former Smoker  Years: 30.00    Last attempt to quit: 04/10/1988    Years since quitting: 28.9  . Smokeless tobacco: Never Used  Substance Use Topics  . Alcohol use: Yes    Alcohol/week: 0.0 oz    Comment: once in a while  . Drug use: No    Allergies  Allergen Reactions  . Balsam [place]-Castor [Amberderm] Other (See Comments)    Unknown  [facility] to [facility] - positive patch test   . Carafate [Sucralfate] Itching  . Dmdm Hydantoin Other (See Comments)    Unknown  [facility] to [facility] - positive patch tes  . Lisinopril Swelling  . Peanut-Containing Drug Products Swelling  . Pepcid [Famotidine] Itching  . Silver Other (See Comments)    blisters  . Tape Itching    Redness   . Zantac [Ranitidine Hcl] Hives  . Xanax [Alprazolam] Rash    Current Outpatient Medications   Medication Sig Dispense Refill  . acetaminophen (TYLENOL) 500 MG tablet Take 500-1,000 mg 2 (two) times daily as needed by mouth for mild pain.     Marland Kitchen amLODipine (NORVASC) 10 MG tablet Take 5 mg daily by mouth. May take an additional 5 mg at night as needed for high blood pressure    . APPLE CIDER VINEGAR PO Take 450 mg by mouth daily.    . ARTIFICIAL TEAR OP Apply 1 drop daily as needed to eye (dry eyes).    Marland Kitchen aspirin 81 MG tablet Take 81 mg by mouth daily.    Marland Kitchen atorvastatin (LIPITOR) 40 MG tablet Take 40 mg at bedtime by mouth.     . Biotin 5000 MCG CAPS Take 10,000 mcg daily by mouth.     . Cholecalciferol (VITAMIN D3) 2000 units capsule Take 2,000 Units by mouth daily.    . clonazePAM (KLONOPIN) 0.5 MG tablet Take 0.25-0.5 mg 2 (two) times daily as needed by mouth for anxiety.     . fluticasone (FLONASE) 50 MCG/ACT nasal spray Place 2 sprays into both nostrils daily as needed for allergies.     Marland Kitchen gabapentin (NEURONTIN) 300 MG capsule Take 300 mg 2 (two) times daily by mouth. May take an additional 300 mgs as needed for pain    . Glucosamine HCl (GLUCOSAMINE PO) Take 2,000 mg 3 (three) times a week by mouth.     . hydrochlorothiazide (HYDRODIURIL) 12.5 MG tablet Take 12.5 mg at bedtime by mouth.     Marland Kitchen ipratropium (ATROVENT HFA) 17 MCG/ACT inhaler Inhale 2 puffs into the lungs every 6 (six) hours as needed for wheezing.     Marland Kitchen losartan (COZAAR) 100 MG tablet Take 100 mg at bedtime by mouth.     . Magnesium 250 MG TABS Take 250 mg by mouth daily.    . metoprolol succinate (TOPROL-XL) 100 MG 24 hr tablet Take 50 mg by mouth at bedtime. Take with or immediately following a meal.    . nystatin ointment (MYCOSTATIN) Apply 1 application 3 (three) times daily as needed topically (itching).   2  . omeprazole (PRILOSEC) 40 MG capsule Take 40 mg by mouth daily.    Marland Kitchen Propylene Glycol-Glycerin (SOOTHE OP) Apply 1 drop daily as needed to eye (dry eyes).    . triamcinolone ointment (KENALOG) 0.1 % Apply 1  application 3 (three) times daily as needed topically (itching).   1  . vitamin B-12 (CYANOCOBALAMIN) 1000 MCG tablet Take 1,000 mcg daily by mouth.     No current facility-administered medications for this visit.  Review of Systems Review of Systems  Constitutional: Negative.   Respiratory: Negative.   Cardiovascular: Negative.     Blood pressure (!) 148/82, pulse 68, resp. rate 16, height 5' (1.524 m), weight 211 lb (95.7 kg).  Physical Exam Physical Exam  Data Reviewed Ct scan reviewed   Assessment   CT scan was reviewed with the patient no abnormality of the chest wall or the ribs are noted.. And there are no other findings to account for the patient's pain on the study.  Patient advised that if the pain becomes more of an issue additional imaging such as bone scan or other modalities can be considered or MRI can be considered.     Plan     Patient to return as scheduled with Dr.Byrnett      HPI, Physical Exam, Assessment and Plan have been scribed under the direction and in the presence of Mckinley Jewel, MD  Gaspar Cola, CMA  I have completed the exam and reviewed the above documentation for accuracy and completeness.  I agree with the above.  Haematologist has been used and any errors in dictation or transcription are unintentional.  Christalyn Goertz G. Jamal Collin, M.D., F.A.C.S.    Junie Panning G 03/06/2017, 6:18 AM

## 2017-03-05 ENCOUNTER — Ambulatory Visit
Admission: RE | Admit: 2017-03-05 | Discharge: 2017-03-05 | Disposition: A | Discharge status: Home / Self Care | Payer: PPO | Source: Ambulatory Visit | Attending: Cardiology | Admitting: Cardiology

## 2017-03-05 ENCOUNTER — Encounter
Admission: RE | Disposition: A | Discharge status: Home / Self Care | Payer: Self-pay | Source: Ambulatory Visit | Attending: Cardiology

## 2017-03-05 DIAGNOSIS — K219 Gastro-esophageal reflux disease without esophagitis: Secondary | ICD-10-CM | POA: Insufficient documentation

## 2017-03-05 DIAGNOSIS — Z87891 Personal history of nicotine dependence: Secondary | ICD-10-CM | POA: Insufficient documentation

## 2017-03-05 DIAGNOSIS — Z7984 Long term (current) use of oral hypoglycemic drugs: Secondary | ICD-10-CM | POA: Diagnosis not present

## 2017-03-05 DIAGNOSIS — F329 Major depressive disorder, single episode, unspecified: Secondary | ICD-10-CM | POA: Insufficient documentation

## 2017-03-05 DIAGNOSIS — R079 Chest pain, unspecified: Secondary | ICD-10-CM | POA: Diagnosis not present

## 2017-03-05 DIAGNOSIS — K760 Fatty (change of) liver, not elsewhere classified: Secondary | ICD-10-CM | POA: Insufficient documentation

## 2017-03-05 DIAGNOSIS — E782 Mixed hyperlipidemia: Secondary | ICD-10-CM | POA: Diagnosis not present

## 2017-03-05 DIAGNOSIS — M1990 Unspecified osteoarthritis, unspecified site: Secondary | ICD-10-CM | POA: Diagnosis not present

## 2017-03-05 DIAGNOSIS — E119 Type 2 diabetes mellitus without complications: Secondary | ICD-10-CM | POA: Insufficient documentation

## 2017-03-05 DIAGNOSIS — G4730 Sleep apnea, unspecified: Secondary | ICD-10-CM | POA: Insufficient documentation

## 2017-03-05 DIAGNOSIS — J449 Chronic obstructive pulmonary disease, unspecified: Secondary | ICD-10-CM | POA: Diagnosis not present

## 2017-03-05 DIAGNOSIS — I10 Essential (primary) hypertension: Secondary | ICD-10-CM | POA: Diagnosis not present

## 2017-03-05 DIAGNOSIS — Z7982 Long term (current) use of aspirin: Secondary | ICD-10-CM | POA: Insufficient documentation

## 2017-03-05 HISTORY — PX: LEFT HEART CATH AND CORONARY ANGIOGRAPHY: CATH118249

## 2017-03-05 LAB — GLUCOSE, CAPILLARY: GLUCOSE-CAPILLARY: 146 mg/dL — AB (ref 65–99)

## 2017-03-05 SURGERY — LEFT HEART CATH AND CORONARY ANGIOGRAPHY
Anesthesia: Moderate Sedation | Laterality: Left

## 2017-03-05 MED ORDER — SODIUM CHLORIDE 0.9 % WEIGHT BASED INFUSION: 1.0000 mL/kg/h | INTRAVENOUS | Status: DC

## 2017-03-05 MED ORDER — MIDAZOLAM HCL 2 MG/2ML IJ SOLN
INTRAMUSCULAR | Status: AC
  Filled 2017-03-05: qty 2

## 2017-03-05 MED ORDER — MIDAZOLAM HCL 2 MG/2ML IJ SOLN
INTRAMUSCULAR | Status: DC | PRN
  Administered 2017-03-05: 1 mg via INTRAVENOUS

## 2017-03-05 MED ORDER — SODIUM CHLORIDE 0.9 % IV SOLN: 250.0000 mL | INTRAVENOUS | Status: DC | PRN

## 2017-03-05 MED ORDER — SODIUM CHLORIDE 0.9 % WEIGHT BASED INFUSION
3.0000 mL/kg/h | INTRAVENOUS | Status: AC
  Administered 2017-03-05: 3 mL/kg/h via INTRAVENOUS

## 2017-03-05 MED ORDER — HEPARIN (PORCINE) IN NACL 2-0.9 UNIT/ML-% IJ SOLN
INTRAMUSCULAR | Status: AC
  Filled 2017-03-05: qty 500

## 2017-03-05 MED ORDER — IOPAMIDOL (ISOVUE-300) INJECTION 61%
INTRAVENOUS | Status: DC | PRN
  Administered 2017-03-05: 100 mL via INTRA_ARTERIAL

## 2017-03-05 MED ORDER — SODIUM CHLORIDE 0.9% FLUSH: 3.0000 mL | Freq: Two times a day (BID) | INTRAVENOUS | Status: DC

## 2017-03-05 MED ORDER — SODIUM CHLORIDE 0.9% FLUSH: 3.0000 mL | INTRAVENOUS | Status: DC | PRN

## 2017-03-05 MED ORDER — ASPIRIN 81 MG PO CHEW: 81.0000 mg | CHEWABLE_TABLET | ORAL | Status: DC

## 2017-03-05 MED ORDER — FENTANYL CITRATE (PF) 100 MCG/2ML IJ SOLN
INTRAMUSCULAR | Status: AC
  Filled 2017-03-05: qty 2

## 2017-03-05 MED ORDER — FENTANYL CITRATE (PF) 100 MCG/2ML IJ SOLN
INTRAMUSCULAR | Status: DC | PRN
  Administered 2017-03-05: 25 ug via INTRAVENOUS

## 2017-03-05 MED ORDER — LIDOCAINE HCL (PF) 1 % IJ SOLN
INTRAMUSCULAR | Status: AC
  Filled 2017-03-05: qty 30

## 2017-03-05 SURGICAL SUPPLY — 10 items
CATH 5FR JL4 DIAGNOSTIC (CATHETERS) ×3 IMPLANT
CATH INFINITI 5 FR 3DRC (CATHETERS) ×3 IMPLANT
CATH INFINITI 5FR ANG PIGTAIL (CATHETERS) ×3 IMPLANT
CATH INFINITI JR4 5F (CATHETERS) ×3 IMPLANT
DEVICE CLOSURE MYNXGRIP 5F (Vascular Products) ×3 IMPLANT
KIT MANI 3VAL PERCEP (MISCELLANEOUS) ×3 IMPLANT
NEEDLE PERC 18GX7CM (NEEDLE) ×3 IMPLANT
PACK CARDIAC CATH (CUSTOM PROCEDURE TRAY) ×3 IMPLANT
SHEATH AVANTI 5FR X 11CM (SHEATH) ×3 IMPLANT
WIRE EMERALD 3MM-J .035X150CM (WIRE) ×3 IMPLANT

## 2017-03-05 NOTE — Progress Notes (Signed)
Pt sitting up in bed eating and drinking w/o difficulty in NAD, family at bedside.  Dr. Ubaldo Glassing at bedside earlier to discuss procedure and follow up.  Discharge and follow up instructions reviewed with pt and family who verbalize understanding.

## 2017-03-05 NOTE — Discharge Instructions (Signed)
Femoral Site Care Refer to this sheet in the next few weeks. These instructions provide you with information about caring for yourself after your procedure. Your health care provider may also give you more specific instructions. Your treatment has been planned according to current medical practices, but problems sometimes occur. Call your health care provider if you have any problems or questions after your procedure. What can I expect after the procedure? After your procedure, it is typical to have the following:  Bruising at the site that usually fades within 1-2 weeks.  Blood collecting in the tissue (hematoma) that may be painful to the touch. It should usually decrease in size and tenderness within 1-2 weeks.  Follow these instructions at home:  Take medicines only as directed by your health care provider.  You may shower 24-48 hours after the procedure or as directed by your health care provider. Remove the bandage (dressing) and gently wash the site with plain soap and water. Pat the area dry with a clean towel. Do not rub the site, because this may cause bleeding.  Do not take baths, swim, or use a hot tub until your health care provider approves.  Check your insertion site every day for redness, swelling, or drainage.  Do not apply powder or lotion to the site.  Limit use of stairs to twice a day for the first 2-3 days or as directed by your health care provider.  Do not squat for the first 2-3 days or as directed by your health care provider.  Do not lift over 10 lb (4.5 kg) for 5 days after your procedure or as directed by your health care provider.  Ask your health care provider when it is okay to: ? Return to work or school. ? Resume usual physical activities or sports. ? Resume sexual activity.  Do not drive home if you are discharged the same day as the procedure. Have someone else drive you.  You may drive 24 hours after the procedure unless otherwise instructed by  your health care provider.  Do not operate machinery or power tools for 24 hours after the procedure or as directed by your health care provider.  If your procedure was done as an outpatient procedure, which means that you went home the same day as your procedure, a responsible adult should be with you for the first 24 hours after you arrive home.  Keep all follow-up visits as directed by your health care provider. This is important. Contact a health care provider if:  You have a fever.  You have chills.  You have increased bleeding from the site. Hold pressure on the site. Get help right away if:  You have unusual pain at the site.  You have redness, warmth, or swelling at the site.  You have drainage (other than a small amount of blood on the dressing) from the site.  The site is bleeding, and the bleeding does not stop after 30 minutes of holding steady pressure on the site.  Your leg or foot becomes pale, cool, tingly, or numb. This information is not intended to replace advice given to you by your health care provider. Make sure you discuss any questions you have with your health care provider. Document Released: 11/28/2013 Document Revised: 09/02/2015 Document Reviewed: 10/14/2013 Elsevier Interactive Patient Education  2018 [place]. Moderate Conscious Sedation, Adult, Care After These instructions provide you with information about caring for yourself after your procedure. Your health care provider may also give you more  specific instructions. Your treatment has been planned according to current medical practices, but problems sometimes occur. Call your health care provider if you have any problems or questions after your procedure. What can I expect after the procedure? After your procedure, it is common:  To feel sleepy for several hours.  To feel clumsy and have poor balance for several hours.  To have poor judgment for several hours.  To vomit if you eat too  soon.  Follow these instructions at home: For at least 24 hours after the procedure:   Do not: ? Participate in activities where you could fall or become injured. ? Drive. ? Use heavy machinery. ? Drink alcohol. ? Take sleeping pills or medicines that cause drowsiness. ? Make important decisions or sign legal documents. ? Take care of children on your own.  Rest. Eating and drinking  Follow the diet recommended by your health care provider.  If you vomit: ? Drink water, juice, or soup when you can drink without vomiting. ? Make sure you have little or no nausea before eating solid foods. General instructions  Have a responsible adult stay with you until you are awake and alert.  Take over-the-counter and prescription medicines only as told by your health care provider.  If you smoke, do not smoke without supervision.  Keep all follow-up visits as told by your health care provider. This is important. Contact a health care provider if:  You keep feeling nauseous or you keep vomiting.  You feel light-headed.  You develop a rash.  You have a fever. Get help right away if:  You have trouble breathing. This information is not intended to replace advice given to you by your health care provider. Make sure you discuss any questions you have with your health care provider. Document Released: 01/15/2013 Document Revised: 08/30/2015 Document Reviewed: 07/17/2015 Elsevier Interactive Patient Education  [employer].

## 2017-03-06 ENCOUNTER — Encounter: Payer: Self-pay | Admitting: Cardiology

## 2017-03-06 DIAGNOSIS — R079 Chest pain, unspecified: Secondary | ICD-10-CM | POA: Diagnosis not present

## 2017-03-06 NOTE — H&P (Signed)
Chief Complaint: Chief Complaint  Patient presents with  . Follow-up  had myoview  . Chest Pain  Im still having these funny feelings in my chest  . Fatigue  Im just tired  Date of Service: 02/22/2017 Date of Birth: 1940-09-10 PCP: Dennison Nancy, MD  History of Present Illness: Kathryn Hammond is a 76 y.o.female patient who returns for follow-up visit. Is under a lot of stress with her husband illness. She has been having episodes of chest pain. She underwent a functional study which revealed borderline areas of ischemia in the anterior distribution. Given her symptoms concern over possible progression of coronary disease. She had insignificant disease by cardiac catheterization approximately 10 years ago. Has a history of hypertension, hyperlipidemia as well as diabetes. She recently underwent an echocardiogram which revealed normal left ventricular function with mild MR, mild TR with mild left ventricular hypertrophy. She continues to have exertional symptoms. Past Medical and Surgical History  Past Medical History Past Medical History:  Diagnosis Date  . Allergic state  . Arthritis  . COPD (chronic obstructive pulmonary disease) , unspecified (CMS-HCC)  . Depression, unspecified  . Dermatitis, unspecified  . Diabetes mellitus type 2, uncomplicated (CMS-HCC)  . Fatty liver disease, nonalcoholic  . GERD (gastroesophageal reflux disease)  . Hiatal hernia  . History of breast cancer 2010  Ductal carcinoma in situ s/p radiation  . History of GI bleed  . History of lung cancer  . Hyperlipidemia, unspecified  . Hypertension  . Melanoma (CMS-HCC) 12/07/09  . Melanoma (CMS-HCC)  . Recurrent chest pain, unspecified  History of  . Reflux esophagitis  . Sleep apnea  . Ulcer   Past Surgical History She has a past surgical history that includes Hysterectomy (1970); Thoracoscopy With Lobectomy Lung (Right, 1989); Hernia repair (1990); Knee arthroscopy (1997); Knee arthroscopy  (1999); Mastectomy partial / lumpectomy (Left); breast cancer; egd (10/04/2006); Colonoscopy (09/02/2003); Colonoscopy (10/04/2006); Colonoscopy (09/23/2010); Colonoscopy (10/28/2015); and egd (10/28/2015).   Medications and Allergies  Current Medications  Current Outpatient Medications  Medication Sig Dispense Refill  . acetaminophen (TYLENOL) 500 MG tablet Take 500 mg by mouth every 6 (six) hours as needed for Pain.  Marland Kitchen albuterol 90 mcg/actuation inhaler Inhale 2 inhalations into the lungs every 6 (six) hours as needed for Wheezing. 1 Inhaler 0  . amLODIPine (NORVASC) 10 MG tablet TAKE 1/2 (ONE-HALF) TABLET BY MOUTH TWICE DAILY BEFORE BREAKFAST AND NIGHTLY 90 tablet 1  . aspirin 81 MG EC tablet Take 81 mg by mouth once daily.  Marland Kitchen atorvastatin (LIPITOR) 40 MG tablet TAKE 1 TABLET BY MOUTH ONCE DAILY 90 tablet 0  . biotin 1 mg Cap Take by mouth.  . blood glucose diagnostic test strip Use 2 (two) times daily. Use as instructed. 180 each 3  . CHOLECALCIFEROL, VITAMIN D3, (VITAMIN D3 ORAL) Take by mouth.  . clonazePAM (KLONOPIN) 0.5 MG tablet TAKE 1 TABLET BY MOUTH ONCE DAILY AS NEEDED FOR ANXIETY 30 tablet 0  . gabapentin (NEURONTIN) 300 MG capsule Take two tablets QHS 180 capsule 1  . hydroCHLOROthiazide (MICROZIDE) 12.5 mg capsule Take 1 capsule (12.5 mg total) by mouth once daily. 90 capsule 1  . losartan (COZAAR) 100 MG tablet Take 1 tablet (100 mg total) by mouth once daily. 90 tablet 1  . magnesium 250 mg Tab Take by mouth once daily.  . metFORMIN (GLUCOPHAGE-XR) 750 MG XR tablet Take 1 tablet (750 mg total) by mouth daily with dinner. 90 tablet 1  . metoprolol succinate (TOPROL-XL) 100  MG XL tablet Take 1 tablet (100 mg total) by mouth once daily. (Patient taking differently: Take 50 mg by mouth once daily.  ) 90 tablet 1  . omeprazole (PRILOSEC) 40 MG DR capsule Take 1 capsule (40 mg total) by mouth once daily. 90 capsule 1  . triamcinolone 0.1 % cream Apply topically 3 (three) times  daily. 30 g 0  . blood glucose meter kit Use as directed. 1 each 0  . lancets Use 1 each 2 (two) times daily. Use as instructed. 180 each 3  . UNABLE TO FIND Diabetic Supplies: Glucometer, test strips, lancets. Check glucose BID (fasting and 2 hours after eating). One month supply. Refills PRN. 1 Units prn  . UNABLE TO FIND Diabetic Supplies: Glucometer, test strips, lancets. Check glucose BID (fasting and 2 hours after eating). One month supply. Refills PRN. 1 Units prn   No current facility-administered medications for this visit.   Allergies: Lisinopril; Xanax [alprazolam]; Amberderm [balsam peru-castor oil]; Balsam [place]; Carafate [sucralfate]; Dmdm hydantoin; Other; Peanut; Pepcid [famotidine]; Silver; Zantac [ranitidine hcl]; and Adhesive tape-silicones  Social and Family History  Social History reports that she quit smoking about 28 years ago. She has a 80.00 pack-year smoking history. She has never used smokeless tobacco. She reports that she drinks about 1.8 oz of alcohol per week. She reports that she does not use drugs.  Family History Family History  Problem Relation Age of Onset  . Cancer Mother  . Stroke Father  30s  . Diabetes type II Sister  . Coronary Artery Disease (Blocked arteries around heart) Brother  . Colon cancer Daughter   Review of Systems  Review of Systems  Constitutional: Positive for diaphoresis and malaise/fatigue. Negative for chills, fever and weight loss.  HENT: Negative for congestion, ear discharge, hearing loss and tinnitus.  Eyes: Negative for blurred vision.  Respiratory: Negative for cough, hemoptysis, sputum production, shortness of breath and wheezing.  Cardiovascular: Positive for leg swelling. Negative for chest pain, palpitations, orthopnea, claudication and PND.  Gastrointestinal: Positive for nausea. Negative for abdominal pain, blood in stool, constipation, diarrhea, heartburn, melena and vomiting.  Genitourinary: Negative for dysuria,  frequency, hematuria and urgency.  Musculoskeletal: Negative for back pain, falls, joint pain and myalgias.  Skin: Negative for itching and rash.  Neurological: Negative for dizziness, tingling, focal weakness, loss of consciousness, weakness and headaches.  Endo/Heme/Allergies: Negative for polydipsia. Does not bruise/bleed easily.  Psychiatric/Behavioral: Negative for depression, memory loss and substance abuse. The patient is not nervous/anxious.    Physical Examination   Vitals: BP 132/70  Pulse 68  Resp 12  Ht 152.4 cm (5')  Wt 96.4 kg (212 lb 9.6 oz)  BMI 41.52 kg/m  Ht:152.4 cm (5') Wt:96.4 kg (212 lb 9.6 oz) NOM:VEHM surface area is 2.02 meters squared. Body mass index is 41.52 kg/m.  Wt Readings from Last 3 Encounters:  02/22/17 96.4 kg (212 lb 9.6 oz)  02/02/17 96.6 kg (213 lb)  01/10/17 96.9 kg (213 lb 9.6 oz)   BP Readings from Last 3 Encounters:  02/22/17 132/70  02/02/17 140/72  01/10/17 (!) 150/90   General appearance appears in no acute distress  Head Mouth and Eye exam Normocephalic, without obvious abnormality, atraumatic Dentition is good Eyes appear anicteric   Neck exam Thyroid: normal  Nodes: no obvious adenopathy  LUNGS Breath Sounds: Normal Percussion: Normal  CARDIOVASCULAR JVP CV wave: no HJR: no Elevation at 90 degrees: None Carotid Pulse: normal pulsation bilaterally Bruit: carotid bilateral Apex: apical impulse  normal  Auscultation Rhythm: normal sinus rhythm S1: normal S2: normal Clicks: no Rub: no Murmurs: no murmurs  Gallop: None ABDOMEN Liver enlargement: no Pulsatile aorta: no Ascites: no Bruits: no  EXTREMITIES Clubbing: no Edema: 1-2 + bilateral pedal edema Pulses: peripheral pulses symmetrical Femoral Bruits: no Amputation: no SKIN Rash: no Cyanosis: no Embolic phemonenon: no Bruising: no NEURO Alert and Oriented to person, place and time: yes Non focal: yes  PSYCH: Pt appears to have normal  affect  LABS REVIEWED Last 3 CBC results: Lab Results  Component Value Date  WBC 4.6 11/06/2016  WBC 5.6 03/17/2016  WBC 5.0 02/24/2016   Lab Results  Component Value Date  HGB 14.1 11/06/2016  HGB 14.6 03/17/2016  HGB 12.9 02/24/2016   Lab Results  Component Value Date  HCT 42.4 11/06/2016  HCT 44.2 03/17/2016  HCT 39.9 02/24/2016   Lab Results  Component Value Date  PLT 188 11/06/2016  PLT 205 03/17/2016  PLT 162 02/24/2016   Lab Results  Component Value Date  CREATININE 0.8 11/06/2016  BUN 16 11/06/2016  NA 141 11/06/2016  K 4.3 11/06/2016  CL 101 11/06/2016  CO2 31.9 11/06/2016   Lab Results  Component Value Date  HGBA1C 6.9 (H) 11/06/2016   Lab Results  Component Value Date  HDL 52.8 02/24/2016  HDL 47.2 03/29/2015  HDL 44.8 03/26/2014   Lab Results  Component Value Date  LDLCALC 69 02/24/2016  LDLCALC 81 03/29/2015  LDLCALC 81 03/26/2014   Lab Results  Component Value Date  TRIG 135 02/24/2016  TRIG 142 03/29/2015  TRIG 159 03/26/2014   Lab Results  Component Value Date  ALT 27 11/06/2016  AST 25 11/06/2016  ALKPHOS 81 11/06/2016   Lab Results  Component Value Date  TSH 1.587 11/06/2016   Assessment and Plan   76 y.o. female with  ICD-10-CM ICD-9-CM  1. Essential hypertension-blood pressure appears well controlled with amlodipine, hydrochlorothiazide and losartan. Will continue with this regimen along with metoprolol. Dash diet is also recommended. Maintaining ideal body weight is recommended. Continue to follow home blood pressures when she is feeling well. I10 401.9  2. Sleep apnea, unspecified type-continue with current plan. Maintaining ideal body weight and CPAP. Keep feet elevated when not ambulating a low-sodium diet. CPAP use was again stressed. G47.30 780.57  3. Hyperlipidemia, mixed-continue with atorvastatin at 40 mg daily. LDL goal less than 100. Current value was 69. Low-cholesterol low-fat diet is also recommended. E78.2  272.2  4. Chest discomfort-functional study showed evidence of possible anterior ischemia. Risk and benefits of left heart catheter explained to the patient. Plan to proceed with left heart cardiac catheterization to evaluate coronary anatomy in a patient with exertional chest pain and evidence of anterior ischemia on functional study.  Return in about 4 weeks (around 03/22/2017).  These notes generated with voice recognition software. I apologize for typographical errors.  Sydnee Levans, MD     Pt seen and examined. No change from above.

## 2017-03-12 DIAGNOSIS — I25118 Atherosclerotic heart disease of native coronary artery with other forms of angina pectoris: Secondary | ICD-10-CM | POA: Diagnosis not present

## 2017-03-12 DIAGNOSIS — Z794 Long term (current) use of insulin: Secondary | ICD-10-CM | POA: Diagnosis not present

## 2017-03-12 DIAGNOSIS — E782 Mixed hyperlipidemia: Secondary | ICD-10-CM | POA: Diagnosis not present

## 2017-03-12 DIAGNOSIS — E119 Type 2 diabetes mellitus without complications: Secondary | ICD-10-CM | POA: Diagnosis not present

## 2017-03-12 DIAGNOSIS — I10 Essential (primary) hypertension: Secondary | ICD-10-CM | POA: Diagnosis not present

## 2017-06-18 DIAGNOSIS — I25118 Atherosclerotic heart disease of native coronary artery with other forms of angina pectoris: Secondary | ICD-10-CM | POA: Diagnosis not present

## 2017-06-18 DIAGNOSIS — Z794 Long term (current) use of insulin: Secondary | ICD-10-CM | POA: Diagnosis not present

## 2017-06-18 DIAGNOSIS — E119 Type 2 diabetes mellitus without complications: Secondary | ICD-10-CM | POA: Diagnosis not present

## 2017-06-18 DIAGNOSIS — E782 Mixed hyperlipidemia: Secondary | ICD-10-CM | POA: Diagnosis not present

## 2017-06-18 DIAGNOSIS — I10 Essential (primary) hypertension: Secondary | ICD-10-CM | POA: Diagnosis not present

## 2017-06-18 DIAGNOSIS — Z0000 Encounter for general adult medical examination without abnormal findings: Secondary | ICD-10-CM | POA: Diagnosis not present

## 2017-06-18 DIAGNOSIS — K219 Gastro-esophageal reflux disease without esophagitis: Secondary | ICD-10-CM | POA: Diagnosis not present

## 2017-07-12 ENCOUNTER — Encounter (INDEPENDENT_AMBULATORY_CARE_PROVIDER_SITE_OTHER): Payer: PPO

## 2017-07-12 ENCOUNTER — Ambulatory Visit (INDEPENDENT_AMBULATORY_CARE_PROVIDER_SITE_OTHER): Payer: PPO | Admitting: Vascular Surgery

## 2017-07-19 ENCOUNTER — Other Ambulatory Visit: Payer: Self-pay

## 2017-07-19 DIAGNOSIS — Z1231 Encounter for screening mammogram for malignant neoplasm of breast: Secondary | ICD-10-CM

## 2017-07-31 DIAGNOSIS — J209 Acute bronchitis, unspecified: Secondary | ICD-10-CM | POA: Diagnosis not present

## 2017-07-31 DIAGNOSIS — I10 Essential (primary) hypertension: Secondary | ICD-10-CM | POA: Diagnosis not present

## 2017-07-31 DIAGNOSIS — Z794 Long term (current) use of insulin: Secondary | ICD-10-CM | POA: Diagnosis not present

## 2017-07-31 DIAGNOSIS — J302 Other seasonal allergic rhinitis: Secondary | ICD-10-CM | POA: Diagnosis not present

## 2017-07-31 DIAGNOSIS — E1165 Type 2 diabetes mellitus with hyperglycemia: Secondary | ICD-10-CM | POA: Diagnosis not present

## 2017-08-09 ENCOUNTER — Ambulatory Visit (INDEPENDENT_AMBULATORY_CARE_PROVIDER_SITE_OTHER): Payer: PPO | Admitting: Vascular Surgery

## 2017-08-09 ENCOUNTER — Encounter (INDEPENDENT_AMBULATORY_CARE_PROVIDER_SITE_OTHER): Payer: PPO

## 2017-08-09 DIAGNOSIS — H1013 Acute atopic conjunctivitis, bilateral: Secondary | ICD-10-CM | POA: Diagnosis not present

## 2017-08-16 ENCOUNTER — Encounter (INDEPENDENT_AMBULATORY_CARE_PROVIDER_SITE_OTHER): Payer: Self-pay | Admitting: Vascular Surgery

## 2017-08-16 ENCOUNTER — Ambulatory Visit (INDEPENDENT_AMBULATORY_CARE_PROVIDER_SITE_OTHER): Payer: PPO | Admitting: Vascular Surgery

## 2017-08-16 ENCOUNTER — Ambulatory Visit (INDEPENDENT_AMBULATORY_CARE_PROVIDER_SITE_OTHER): Payer: PPO

## 2017-08-16 VITALS — BP 141/71 | HR 70 | Resp 14 | Ht 61.0 in | Wt 206.0 lb

## 2017-08-16 DIAGNOSIS — I8312 Varicose veins of left lower extremity with inflammation: Secondary | ICD-10-CM | POA: Diagnosis not present

## 2017-08-16 DIAGNOSIS — I6523 Occlusion and stenosis of bilateral carotid arteries: Secondary | ICD-10-CM

## 2017-08-16 DIAGNOSIS — I10 Essential (primary) hypertension: Secondary | ICD-10-CM

## 2017-08-16 DIAGNOSIS — E785 Hyperlipidemia, unspecified: Secondary | ICD-10-CM

## 2017-08-16 DIAGNOSIS — I8311 Varicose veins of right lower extremity with inflammation: Secondary | ICD-10-CM | POA: Diagnosis not present

## 2017-08-18 NOTE — Progress Notes (Signed)
MRN : [id number]  Kathryn Hammond is a 77 y.o. (Aug 16, 1940) female who presents with chief complaint of  Chief Complaint  Patient presents with  . Follow-up    1 year Carotid follow up  .  History of Present Illness: The patient is seen for follow up evaluation of carotid stenosis. The carotid stenosis followed by ultrasound.   The patient denies amaurosis fugax. There is no recent history of TIA symptoms or focal motor deficits. There is no prior documented CVA.  The patient is taking enteric-coated aspirin 81 mg daily.  There is no history of migraine headaches. There is no history of seizures.  The patient is seen for evaluation of symptomatic varicose veins. The patient relates burning and stinging which worsened steadily throughout the course of the day, particularly with standing. The patient also notes an aching and throbbing pain over the varicosities, particularly with prolonged dependent positions. The symptoms are significantly improved with elevation.  At this point, the symptoms are persistent and but not severe enough that they're having a negative impact on lifestyle.  There is no history of DVT, PE or superficial thrombophlebitis. There is no history of ulceration or hemorrhage. The patient denies a significant family history of varicose veins.   The patient has not worn graduated compression in the past. At the present time the patient has not been using over-the-counter analgesics. There is no history of prior surgical intervention or sclerotherapy.  The patient has a history of coronary artery disease, no recent episodes of angina or shortness of breath. The patient denies PAD or claudication symptoms. There is a history of hyperlipidemia which is being treated with a statin.    Carotid Duplex done today shows RICA 9-89% and LICA40-59%.  No change compared to last study.  Current Meds  Medication Sig  . acetaminophen (TYLENOL) 500 MG tablet Take 500-1,000  mg 2 (two) times daily as needed by mouth for mild pain.   Marland Kitchen amLODipine (NORVASC) 10 MG tablet Take 5 mg daily by mouth. May take an additional 5 mg at night as needed for high blood pressure  . APPLE CIDER VINEGAR PO Take 450 mg by mouth daily.  . ARTIFICIAL TEAR OP Apply 1 drop daily as needed to eye (dry eyes).  Marland Kitchen aspirin 81 MG tablet Take 81 mg by mouth daily.  Marland Kitchen atorvastatin (LIPITOR) 40 MG tablet Take 40 mg at bedtime by mouth.   . Biotin 5000 MCG CAPS Take 10,000 mcg daily by mouth.   . cetirizine (ZYRTEC) 10 MG tablet Take by mouth.  . Cholecalciferol (VITAMIN D3) 2000 units capsule Take 2,000 Units by mouth daily.  . clonazePAM (KLONOPIN) 0.5 MG tablet Take 0.25-0.5 mg 2 (two) times daily as needed by mouth for anxiety.   . fluticasone (FLONASE) 50 MCG/ACT nasal spray Place 2 sprays into both nostrils daily as needed for allergies.   Marland Kitchen gabapentin (NEURONTIN) 300 MG capsule Take 300 mg 2 (two) times daily by mouth. May take an additional 300 mgs as needed for pain  . Glucosamine HCl (GLUCOSAMINE PO) Take 2,000 mg 3 (three) times a week by mouth.   . hydrochlorothiazide (HYDRODIURIL) 12.5 MG tablet Take 12.5 mg at bedtime by mouth.   Marland Kitchen ipratropium (ATROVENT HFA) 17 MCG/ACT inhaler Inhale 2 puffs into the lungs every 6 (six) hours as needed for wheezing.   Marland Kitchen losartan (COZAAR) 100 MG tablet Take 100 mg at bedtime by mouth.   . Magnesium 250 MG TABS Take 250 mg  by mouth daily.  . metFORMIN (GLUCOPHAGE-XR) 750 MG 24 hr tablet Take by mouth.  . metoprolol succinate (TOPROL-XL) 100 MG 24 hr tablet Take 50 mg by mouth at bedtime. Take with or immediately following a meal.  . montelukast (SINGULAIR) 10 MG tablet Take by mouth.  . nystatin ointment (MYCOSTATIN) Apply 1 application 3 (three) times daily as needed topically (itching).   Marland Kitchen omeprazole (PRILOSEC) 40 MG capsule Take 40 mg by mouth daily.  Marland Kitchen Propylene Glycol-Glycerin (SOOTHE OP) Apply 1 drop daily as needed to eye (dry eyes).  .  triamcinolone ointment (KENALOG) 0.1 % Apply 1 application 3 (three) times daily as needed topically (itching).   . vitamin B-12 (CYANOCOBALAMIN) 1000 MCG tablet Take 1,000 mcg daily by mouth.    Past Medical History:  Diagnosis Date  . Arthritis   . Asthma   . CPAP (continuous positive airway pressure) dependence 15 years  . Depression   . Dermatitis   . Diabetes ([place])    non-insulin dependent  . Dyspnea   . Emphysema   . Fatty liver disease, nonalcoholic   . GERD (gastroesophageal reflux disease)   . History of hiatal hernia   . Hyperlipidemia   . Lung cancer ([place])   . Melanoma ([place])    right temple  . Neuromuscular disorder (HCC)    neuropathy  . Obesity, unspecified   . Personal history of malignant neoplasm of breast    left breast DCIS  . Personal history of malignant neoplasm of bronchus and lung    right lung cancer  . Personal history of tobacco use, presenting hazards to health   . Reflux esophagitis   . Screening for obesity   . Seasonal allergies   . Sleep apnea    cpap  . Ulcer    peptic  . Unspecified essential hypertension   . Varicose veins of lower extremities with other complications     Past Surgical History:  Procedure Laterality Date  . ABDOMINAL HYSTERECTOMY  1972  . BREAST BIOPSY Right   . BREAST EXCISIONAL BIOPSY Left 2010   DCIS  . BREAST LUMPECTOMY Left 2010   R for DCIS  . BREAST SURGERY Left 2010   mammosite balloon placement and removal  . CATARACT EXTRACTION W/PHACO Right 09/12/2016   Procedure: CATARACT EXTRACTION PHACO AND INTRAOCULAR LENS PLACEMENT (IOC);  Surgeon: Birder Robson, MD;  Location: [facility] ORS;  Service: Ophthalmology;  Laterality: Right;  [place] 00:38.2 AP% 10.9 CDE 4.16 Fluid Pack lot # [id number]  . CATARACT EXTRACTION W/PHACO Left 10/03/2016   Procedure: CATARACT EXTRACTION PHACO AND INTRAOCULAR LENS PLACEMENT (IOC);  Surgeon: Birder Robson, MD;  Location: [facility] ORS;  Service: Ophthalmology;  Laterality: Left;  [place]  00:37 AP% 20.6 CDE 7.69 Fluid pack lot # [id number]  . COLONOSCOPY  2007 and 2012   [facility]; polyps  . COLONOSCOPY WITH PROPOFOL N/A 10/28/2015   Procedure: COLONOSCOPY WITH PROPOFOL;  Surgeon: Manya Silvas, MD;  Location: [facility] ENDOSCOPY;  Service: Endoscopy;  Laterality: N/A;  . ESOPHAGOGASTRODUODENOSCOPY (EGD) WITH PROPOFOL N/A 10/28/2015   Procedure: ESOPHAGOGASTRODUODENOSCOPY (EGD) WITH PROPOFOL;  Surgeon: Manya Silvas, MD;  Location: [facility] ENDOSCOPY;  Service: Endoscopy;  Laterality: N/A;  . EYE SURGERY  1992  . HERNIA REPAIR  1990  . KNEE SURGERY  1999   knee replaced  . KNEE SURGERY  1977   knees replaced  . LEFT HEART CATH AND CORONARY ANGIOGRAPHY Left 03/05/2017   Procedure: LEFT HEART CATH AND CORONARY ANGIOGRAPHY;  Surgeon: Teodoro Spray, MD;  Location: [facility];  Service: Cardiovascular;  Laterality: Left;  . LOBECTOMY  1989  . MELANOMA EXCISION  December 07, 2009   right temple  . MOHS SURGERY  2016   [facility], nose  . SALPINGOOPHORECTOMY  1972  . VEIN SURGERY  September 2011   vein closure procedure left leg; RF ablation of GSV in both legs    Social History Social History   Tobacco Use  . Smoking status: Former Smoker    Years: 30.00    Last attempt to quit: 04/10/1988    Years since quitting: 29.3  . Smokeless tobacco: Never Used  Substance Use Topics  . Alcohol use: Yes    Alcohol/week: 0.0 oz    Comment: once in a while  . Drug use: No    Family History Family History  Problem Relation Age of Onset  . Ovarian cancer Mother     Allergies  Allergen Reactions  . Balsam [place]-Castor [Amberderm] Other (See Comments)    Unknown  According to [facility] - positive patch test   . Carafate [Sucralfate] Itching  . Dmdm Hydantoin Other (See Comments)    Unknown  According to [facility] - positive patch tes  . Lisinopril Swelling  . Peanut-Containing Drug Products Swelling  . Pepcid [Famotidine] Itching  . Silver Other (See Comments)     blisters  . Tape Itching    Redness   . Zantac [Ranitidine Hcl] Hives  . Xanax [Alprazolam] Rash     REVIEW OF SYSTEMS (Negative unless checked)  Constitutional: [] Weight loss  [] Fever  [] Chills Cardiac: [] Chest pain   [] Chest pressure   [] Palpitations   [] Shortness of breath when laying flat   [] Shortness of breath with exertion. Vascular:  [] Pain in legs with walking   [x] Pain in legs at rest  [] History of DVT   [] Phlebitis   [x] Swelling in legs   [x] Varicose veins   [] Non-healing ulcers Pulmonary:   [] Uses home oxygen   [] Productive cough   [] Hemoptysis   [] Wheeze  [] COPD   [] Asthma Neurologic:  [] Dizziness   [] Seizures   [] History of stroke   [] History of TIA  [] Aphasia   [] Vissual changes   [] Weakness or numbness in arm   [] Weakness or numbness in leg Musculoskeletal:   [] Joint swelling   [] Joint pain   [] Low back pain Hematologic:  [] Easy bruising  [] Easy bleeding   [] Hypercoagulable state   [] Anemic Gastrointestinal:  [] Diarrhea   [] Vomiting  [] Gastroesophageal reflux/heartburn   [] Difficulty swallowing. Genitourinary:  [] Chronic kidney disease   [] Difficult urination  [] Frequent urination   [] Blood in urine Skin:  [] Rashes   [] Ulcers  Psychological:  [] History of anxiety   []  History of major depression.  Physical Examination  Vitals:   08/16/17 1544  BP: (!) 141/71  Pulse: 70  Resp: 14  Weight: 206 lb (93.4 kg)  Height: 5\' 1"  (1.549 m)   Body mass index is 38.92 kg/m. Gen: WD/WN, NAD Head: [place]/AT, No temporalis wasting.  Ear/Nose/Throat: Hearing grossly intact, nares w/o erythema or drainage Eyes: PER, EOMI, sclera nonicteric.  Neck: Supple, no large masses.   Pulmonary:  Good air movement, no audible wheezing bilaterally, no use of accessory muscles.  Cardiac: RRR, no JVD Vascular:  Left carotid bruit Vessel Right Left  Radial Palpable Palpable  Ulnar Palpable Palpable  Brachial Palpable Palpable  Carotid Palpable Palpable  Gastrointestinal: Non-distended. No  guarding/no peritoneal signs.  Musculoskeletal: M/S 5/5 throughout.  No deformity or atrophy.  Neurologic: CN 2-12 intact. Symmetrical.  Speech is fluent. Motor exam as listed above. Psychiatric: Judgment intact, Mood & affect appropriate for pt's clinical situation. Dermatologic: No rashes or ulcers noted.  No changes consistent with cellulitis. Lymph : No lichenification or skin changes of chronic lymphedema.  CBC Lab Results  Component Value Date   WBC 5.9 11/23/2015   HGB 14.7 11/23/2015   HCT 43.2 11/23/2015   MCV 85.9 11/23/2015   PLT 183 11/23/2015    BMET    Component Value Date/Time   NA 141 11/23/2015 0441   NA 132 (L) 04/14/2013 2002   K 3.2 (L) 11/23/2015 0441   K 4.7 04/14/2013 2002   CL 102 11/23/2015 0441   CL 100 04/14/2013 2002   CO2 30 11/23/2015 0441   CO2 26 04/14/2013 2002   GLUCOSE 174 (H) 11/23/2015 0441   GLUCOSE 131 (H) 04/14/2013 2002   BUN 16 11/23/2015 0441   BUN 23 (H) 04/14/2013 2002   CREATININE 0.90 01/25/2017 0924   CREATININE 0.63 04/14/2013 2002   CALCIUM 9.3 11/23/2015 0441   CALCIUM 9.1 04/14/2013 2002   GFRNONAA >60 11/23/2015 0441   GFRNONAA >60 04/14/2013 2002   GFRAA >60 11/23/2015 0441   GFRAA >60 04/14/2013 2002   CrCl cannot be calculated (Patient's most recent lab result is older than the maximum 21 days allowed.).  COAG No results found for: INR, PROTIME  Radiology No results found.  Assessment/Plan 1. Bilateral carotid artery stenosis Recommend:  Given the patient's asymptomatic subcritical stenosis no further invasive testing or surgery at this time.  Duplex ultrasound shows 65-79% LICA stenosis.  Continue antiplatelet therapy as prescribed Continue management of CAD, HTN and Hyperlipidemia Healthy heart diet,  encouraged exercise at least 4 times per week Follow up in 12 months with duplex ultrasound and physical exam based on the stable >50% stenosis of the LICA carotid artery   - VAS US CAROTID;  Future  2. Varicose veins of both lower extremities with inflammation Recommend:  The patient is complaining of varicose veins.    I have had a long discussion with the patient regarding  varicose veins and why they cause symptoms.  Patient will begin wearing graduated compression stockings on a daily basis, beginning first thing in the morning and removing them in the evening. The patient is instructed specifically not to sleep in the stockings.    The patient  will also begin using over-the-counter analgesics such as Motrin 600 mg po TID to help control the symptoms as needed.    In addition, behavioral modification including elevation during the day will be initiated, utilizing a recliner was recommended.  The patient is also instructed to continue exercising such as walking 4-5 times per week.  At this time the patient wishes to continue conservative therapy and is not interested in more invasive treatments such as laser ablation and sclerotherapy.  3. Essential hypertension Continue antihypertensive medications as already ordered, these medications have been reviewed and there are no changes at this time.   4. Hyperlipidemia, unspecified hyperlipidemia type Continue statin as ordered and reviewed, no changes at this time    Hortencia Pilar, MD  08/18/2017 4:31 PM

## 2017-08-19 ENCOUNTER — Encounter (INDEPENDENT_AMBULATORY_CARE_PROVIDER_SITE_OTHER): Payer: Self-pay | Admitting: Vascular Surgery

## 2017-08-19 DIAGNOSIS — I8312 Varicose veins of left lower extremity with inflammation: Secondary | ICD-10-CM

## 2017-08-19 DIAGNOSIS — I8311 Varicose veins of right lower extremity with inflammation: Secondary | ICD-10-CM | POA: Insufficient documentation

## 2017-09-07 DIAGNOSIS — E119 Type 2 diabetes mellitus without complications: Secondary | ICD-10-CM | POA: Diagnosis not present

## 2017-09-07 DIAGNOSIS — Z794 Long term (current) use of insulin: Secondary | ICD-10-CM | POA: Diagnosis not present

## 2017-09-07 DIAGNOSIS — I10 Essential (primary) hypertension: Secondary | ICD-10-CM | POA: Diagnosis not present

## 2017-09-07 DIAGNOSIS — I25118 Atherosclerotic heart disease of native coronary artery with other forms of angina pectoris: Secondary | ICD-10-CM | POA: Diagnosis not present

## 2017-09-07 DIAGNOSIS — R0789 Other chest pain: Secondary | ICD-10-CM | POA: Diagnosis not present

## 2017-09-07 DIAGNOSIS — R0602 Shortness of breath: Secondary | ICD-10-CM | POA: Diagnosis not present

## 2017-09-07 DIAGNOSIS — E782 Mixed hyperlipidemia: Secondary | ICD-10-CM | POA: Diagnosis not present

## 2017-09-10 ENCOUNTER — Ambulatory Visit
Admission: RE | Admit: 2017-09-10 | Discharge: 2017-09-10 | Disposition: A | Discharge status: Home / Self Care | Payer: PPO | Source: Ambulatory Visit | Attending: General Surgery | Admitting: General Surgery

## 2017-09-10 DIAGNOSIS — Z1231 Encounter for screening mammogram for malignant neoplasm of breast: Secondary | ICD-10-CM

## 2017-09-10 HISTORY — DX: Personal history of irradiation: Z92.3

## 2017-09-10 HISTORY — DX: Malignant neoplasm of unspecified site of unspecified female breast: C50.919

## 2017-09-11 ENCOUNTER — Other Ambulatory Visit: Payer: Self-pay | Admitting: General Surgery

## 2017-09-11 DIAGNOSIS — R928 Other abnormal and inconclusive findings on diagnostic imaging of breast: Secondary | ICD-10-CM

## 2017-09-11 DIAGNOSIS — R921 Mammographic calcification found on diagnostic imaging of breast: Secondary | ICD-10-CM

## 2017-09-14 DIAGNOSIS — R0789 Other chest pain: Secondary | ICD-10-CM | POA: Diagnosis not present

## 2017-09-14 DIAGNOSIS — R0602 Shortness of breath: Secondary | ICD-10-CM | POA: Diagnosis not present

## 2017-09-18 DIAGNOSIS — D2271 Melanocytic nevi of right lower limb, including hip: Secondary | ICD-10-CM | POA: Diagnosis not present

## 2017-09-18 DIAGNOSIS — D2261 Melanocytic nevi of right upper limb, including shoulder: Secondary | ICD-10-CM | POA: Diagnosis not present

## 2017-09-18 DIAGNOSIS — Z08 Encounter for follow-up examination after completed treatment for malignant neoplasm: Secondary | ICD-10-CM | POA: Diagnosis not present

## 2017-09-18 DIAGNOSIS — D2272 Melanocytic nevi of left lower limb, including hip: Secondary | ICD-10-CM | POA: Diagnosis not present

## 2017-09-18 DIAGNOSIS — X32XXXA Exposure to sunlight, initial encounter: Secondary | ICD-10-CM | POA: Diagnosis not present

## 2017-09-18 DIAGNOSIS — L570 Actinic keratosis: Secondary | ICD-10-CM | POA: Diagnosis not present

## 2017-09-18 DIAGNOSIS — B372 Candidiasis of skin and nail: Secondary | ICD-10-CM | POA: Diagnosis not present

## 2017-09-18 DIAGNOSIS — Z85820 Personal history of malignant melanoma of skin: Secondary | ICD-10-CM | POA: Diagnosis not present

## 2017-09-18 DIAGNOSIS — Z85828 Personal history of other malignant neoplasm of skin: Secondary | ICD-10-CM | POA: Diagnosis not present

## 2017-09-18 DIAGNOSIS — D2262 Melanocytic nevi of left upper limb, including shoulder: Secondary | ICD-10-CM | POA: Diagnosis not present

## 2017-09-18 DIAGNOSIS — C44212 Basal cell carcinoma of skin of right ear and external auricular canal: Secondary | ICD-10-CM | POA: Diagnosis not present

## 2017-09-18 DIAGNOSIS — D485 Neoplasm of uncertain behavior of skin: Secondary | ICD-10-CM | POA: Diagnosis not present

## 2017-09-20 ENCOUNTER — Ambulatory Visit
Admission: RE | Admit: 2017-09-20 | Discharge: 2017-09-20 | Disposition: A | Discharge status: Home / Self Care | Payer: PPO | Source: Ambulatory Visit | Attending: General Surgery | Admitting: General Surgery

## 2017-09-20 ENCOUNTER — Encounter: Payer: Self-pay | Admitting: General Surgery

## 2017-09-20 ENCOUNTER — Ambulatory Visit (INDEPENDENT_AMBULATORY_CARE_PROVIDER_SITE_OTHER): Payer: PPO | Admitting: General Surgery

## 2017-09-20 VITALS — BP 132/80 | HR 80 | Resp 14 | Ht 60.0 in | Wt 202.0 lb

## 2017-09-20 DIAGNOSIS — R928 Other abnormal and inconclusive findings on diagnostic imaging of breast: Secondary | ICD-10-CM | POA: Insufficient documentation

## 2017-09-20 DIAGNOSIS — R920 Mammographic microcalcification found on diagnostic imaging of breast: Secondary | ICD-10-CM | POA: Diagnosis not present

## 2017-09-20 DIAGNOSIS — Z86000 Personal history of in-situ neoplasm of breast: Secondary | ICD-10-CM | POA: Diagnosis not present

## 2017-09-20 DIAGNOSIS — R921 Mammographic calcification found on diagnostic imaging of breast: Secondary | ICD-10-CM

## 2017-09-20 DIAGNOSIS — Z853 Personal history of malignant neoplasm of breast: Secondary | ICD-10-CM | POA: Diagnosis not present

## 2017-09-20 NOTE — Progress Notes (Signed)
Patient ID: Kathryn Hammond, female   DOB: 09-01-1940, 77 y.o.   MRN: [id number]  Chief Complaint  Patient presents with  . Follow-up    HPI Kathryn Hammond is a 77 y.o. female who presents for a breast evaluation. The most recent mammogram was done on 09/10/2017.  Patient does perform regular self breast checks and gets regular mammograms done. Patient lost her husband in May 2019.   HPI  Past Medical History:  Diagnosis Date  . Arthritis   . Asthma   . Breast cancer ([place]) 2010   left breast cancer-DCIS  . CPAP (continuous positive airway pressure) dependence 15 years  . Depression   . Dermatitis   . Diabetes ([place])    non-insulin dependent  . Dyspnea   . Emphysema   . Fatty liver disease, nonalcoholic   . GERD (gastroesophageal reflux disease)   . History of hiatal hernia   . Hyperlipidemia   . Lung cancer ([place])   . Melanoma ([place])    right temple  . Neuromuscular disorder ([facility])    neuropathy  . Obesity, unspecified   . Personal history of malignant neoplasm of breast    left breast DCIS  . Personal history of malignant neoplasm of bronchus and lung    right lung cancer  . Personal history of radiation therapy 2010   f/u left breast cancer   . Personal history of tobacco use, presenting hazards to health   . Reflux esophagitis   . Screening for obesity   . Seasonal allergies   . Sleep apnea    cpap  . Ulcer    peptic  . Unspecified essential hypertension   . Varicose veins of lower extremities with other complications     Past Surgical History:  Procedure Laterality Date  . ABDOMINAL HYSTERECTOMY  1972  . BREAST BIOPSY Right   . BREAST EXCISIONAL BIOPSY Left 2010   DCIS  . BREAST LUMPECTOMY Left 2010   R for DCIS  . BREAST SURGERY Left 2010   mammosite balloon placement and removal  . CATARACT EXTRACTION W/PHACO Right 09/12/2016   Procedure: CATARACT EXTRACTION PHACO AND INTRAOCULAR LENS PLACEMENT (IOC);  Surgeon: Birder Robson, MD;  Location: [facility]  ORS;  Service: Ophthalmology;  Laterality: Right;  [place] 00:38.2 AP% 10.9 CDE 4.16 Fluid Pack lot # [id number]  . CATARACT EXTRACTION W/PHACO Left 10/03/2016   Procedure: CATARACT EXTRACTION PHACO AND INTRAOCULAR LENS PLACEMENT (IOC);  Surgeon: Birder Robson, MD;  Location: [facility] ORS;  Service: Ophthalmology;  Laterality: Left;  [place] 00:37 AP% 20.6 CDE 7.69 Fluid pack lot # [id number]  . COLONOSCOPY  2007 and 2012   Dr. [facility]a [facility]ar at [facility]; polyps  . COLONOSCOPY WITH PROPOFOL N/A 10/28/2015   Procedure: COLONOSCOPY WITH PROPOFOL;  Surgeon: Manya Silvas, MD;  Location: [facility] ENDOSCOPY;  Service: Endoscopy;  Laterality: N/A;  . ESOPHAGOGASTRODUODENOSCOPY (EGD) WITH PROPOFOL N/A 10/28/2015   Procedure: ESOPHAGOGASTRODUODENOSCOPY (EGD) WITH PROPOFOL;  Surgeon: Manya Silvas, MD;  Location: [facility] ENDOSCOPY;  Service: Endoscopy;  Laterality: N/A;  . EYE SURGERY  1992  . HERNIA REPAIR  1990  . KNEE SURGERY  1999   knee replaced  . KNEE SURGERY  1977   knees replaced  . LEFT HEART CATH AND CORONARY ANGIOGRAPHY Left 03/05/2017   Procedure: LEFT HEART CATH AND CORONARY ANGIOGRAPHY;  Surgeon: Teodoro Spray, MD;  Location: [place] CV LAB;  Service: Cardiovascular;  Laterality: Left;  . LOBECTOMY  1989  . MELANOMA EXCISION  December 07, 2009  right temple  . MOHS SURGERY  2016   [facility], nose  . SALPINGOOPHORECTOMY  1972  . VEIN SURGERY  September 2011   vein closure procedure left leg; RF ablation of GSV in both legs    Family History  Problem Relation Age of Onset  . Ovarian cancer Mother   . Breast cancer Neg Hx     Social History Social History   Tobacco Use  . Smoking status: Former Smoker    Years: 30.00    Last attempt to quit: 04/10/1988    Years since quitting: 29.4  . Smokeless tobacco: Never Used  Substance Use Topics  . Alcohol use: Yes    Alcohol/week: 0.0 oz    Comment: once in a while  . Drug use: No    Allergies  Allergen Reactions  . Balsam [place]-Castor  [Amberderm] Other (See Comments)    Unknown  According to [facility] - positive patch test   . Carafate [Sucralfate] Itching  . Dmdm Hydantoin Other (See Comments)    Unknown  According to [facility] - positive patch tes  . Lisinopril Swelling  . Peanut-Containing Drug Products Swelling  . Pepcid [Famotidine] Itching  . Silver Other (See Comments)    blisters  . Tape Itching    Redness   . Zantac [Ranitidine Hcl] Hives  . Xanax [Alprazolam] Rash    Current Outpatient Medications  Medication Sig Dispense Refill  . acetaminophen (TYLENOL) 500 MG tablet Take 500-1,000 mg 2 (two) times daily as needed by mouth for mild pain.     Marland Kitchen amLODipine (NORVASC) 10 MG tablet Take 5 mg daily by mouth. May take an additional 5 mg at night as needed for high blood pressure    . APPLE CIDER VINEGAR PO Take 450 mg by mouth daily.    . ARTIFICIAL TEAR OP Apply 1 drop daily as needed to eye (dry eyes).    Marland Kitchen aspirin 81 MG tablet Take 81 mg by mouth daily.    Marland Kitchen atorvastatin (LIPITOR) 40 MG tablet Take 40 mg at bedtime by mouth.     . Biotin 5000 MCG CAPS Take 10,000 mcg daily by mouth.     . cetirizine (ZYRTEC) 10 MG tablet Take by mouth.    . Cholecalciferol (VITAMIN D3) 2000 units capsule Take 2,000 Units by mouth daily.    . clonazePAM (KLONOPIN) 0.5 MG tablet Take 0.25-0.5 mg 2 (two) times daily as needed by mouth for anxiety.     . fluticasone (FLONASE) 50 MCG/ACT nasal spray Place 2 sprays into both nostrils daily as needed for allergies.     Marland Kitchen gabapentin (NEURONTIN) 300 MG capsule Take 300 mg 2 (two) times daily by mouth. May take an additional 300 mgs as needed for pain    . Glucosamine HCl (GLUCOSAMINE PO) Take 2,000 mg 3 (three) times a week by mouth.     . hydrochlorothiazide (HYDRODIURIL) 12.5 MG tablet Take 12.5 mg at bedtime by mouth.     Marland Kitchen ipratropium (ATROVENT HFA) 17 MCG/ACT inhaler Inhale 2 puffs into the lungs every 6 (six) hours as needed for wheezing.     Marland Kitchen losartan (COZAAR) 100 MG tablet Take  100 mg at bedtime by mouth.     . Magnesium 250 MG TABS Take 250 mg by mouth daily.    . metFORMIN (GLUCOPHAGE-XR) 750 MG 24 hr tablet Take by mouth.    . metoprolol succinate (TOPROL-XL) 100 MG 24 hr tablet Take 50 mg by mouth at bedtime. Take with or  immediately following a meal.    . montelukast (SINGULAIR) 10 MG tablet Take by mouth.    . nystatin ointment (MYCOSTATIN) Apply 1 application 3 (three) times daily as needed topically (itching).   2  . omeprazole (PRILOSEC) 40 MG capsule Take 40 mg by mouth daily.    Marland Kitchen Propylene Glycol-Glycerin (SOOTHE OP) Apply 1 drop daily as needed to eye (dry eyes).    . triamcinolone ointment (KENALOG) 0.1 % Apply 1 application 3 (three) times daily as needed topically (itching).   1  . vitamin B-12 (CYANOCOBALAMIN) 1000 MCG tablet Take 1,000 mcg daily by mouth.     No current facility-administered medications for this visit.     Review of Systems Review of Systems  Constitutional: Negative.   Respiratory: Negative.   Cardiovascular: Negative.     Blood pressure 132/80, pulse 80, resp. rate 14, height 5' (1.524 m), weight 202 lb (91.6 kg).  Physical Exam Physical Exam  Constitutional: She is oriented to person, place, and time. She appears well-developed and well-nourished.  Eyes: Conjunctivae are normal. No scleral icterus.  Neck: Neck supple.  Cardiovascular: Normal rate, regular rhythm and normal heart sounds.  Pulmonary/Chest: Effort normal and breath sounds normal. Right breast exhibits no inverted nipple, no mass, no nipple discharge, no skin change and no tenderness. Left breast exhibits no inverted nipple, no mass, no nipple discharge, no skin change and no tenderness.    Lymphadenopathy:    She has no cervical adenopathy.    She has no axillary adenopathy.  Neurological: She is alert and oriented to person, place, and time.  Skin: Skin is warm and dry.    Data Reviewed Bilateral screening mammograms of September 10, 2017 were reviewed.   New area of microcalcifications noted posterior to the previous surgical site.  BI-RADS-0.  Assessment    New microcalcifications warranting additional views.  High likelihood biopsy will be recommended.  This was reviewed with the patient.  Should biopsy be recommended, she will likely benefit from using the seated machine with the radiology service.  (She has had a prone stereotactic biopsy in the past, but her age would make this more difficult at this time).    Plan Patient is scheduled for added views today. Follow up appointment to be announced.  . The patient is aware to call back for any questions or concerns.   HPI, Physical Exam, Assessment and Plan have been scribed under the direction and in the presence of Hervey Ard, MD.  Gaspar Cola, CMA  I have completed the exam and reviewed the above documentation for accuracy and completeness.  I agree with the above.  Haematologist has been used and any errors in dictation or transcription are unintentional.  Hervey Ard, M.D., F.A.C.S.  Forest Gleason Atasha [place]bank 09/22/2017, 12:29 PM

## 2017-09-20 NOTE — Patient Instructions (Addendum)
Follow up appointment to be announced.  

## 2017-09-22 DIAGNOSIS — Z86000 Personal history of in-situ neoplasm of breast: Secondary | ICD-10-CM | POA: Insufficient documentation

## 2017-09-22 DIAGNOSIS — R920 Mammographic microcalcification found on diagnostic imaging of breast: Secondary | ICD-10-CM | POA: Insufficient documentation

## 2017-09-24 ENCOUNTER — Other Ambulatory Visit: Payer: Self-pay | Admitting: General Surgery

## 2017-09-24 DIAGNOSIS — R921 Mammographic calcification found on diagnostic imaging of breast: Secondary | ICD-10-CM

## 2017-09-24 DIAGNOSIS — R928 Other abnormal and inconclusive findings on diagnostic imaging of breast: Secondary | ICD-10-CM

## 2017-09-27 ENCOUNTER — Ambulatory Visit
Admission: RE | Admit: 2017-09-27 | Discharge: 2017-09-27 | Disposition: A | Discharge status: Home / Self Care | Payer: PPO | Source: Ambulatory Visit | Attending: General Surgery | Admitting: General Surgery

## 2017-09-27 DIAGNOSIS — R928 Other abnormal and inconclusive findings on diagnostic imaging of breast: Secondary | ICD-10-CM

## 2017-09-27 DIAGNOSIS — R921 Mammographic calcification found on diagnostic imaging of breast: Secondary | ICD-10-CM

## 2017-09-27 HISTORY — PX: BREAST BIOPSY: SHX20

## 2017-09-28 ENCOUNTER — Telehealth: Payer: Self-pay | Admitting: General Surgery

## 2017-09-28 LAB — SURGICAL PATHOLOGY

## 2017-09-28 NOTE — Telephone Encounter (Signed)
Notified pathology was fine. (Verbal report from pathologist). The patient reports she tolerated the procedure well and has had minimal discomfort. Has been using ice as requested.

## 2017-10-14 IMAGING — MG DIGITAL SCREENING BILATERAL MAMMOGRAM WITH CAD
6 series · 6 of 6 positions shown · non-contrast
Comparison: Previous exam(s).

CLINICAL DATA: Screening.

EXAM:
DIGITAL SCREENING BILATERAL MAMMOGRAM WITH CAD

[R MLO (1 of 2)]
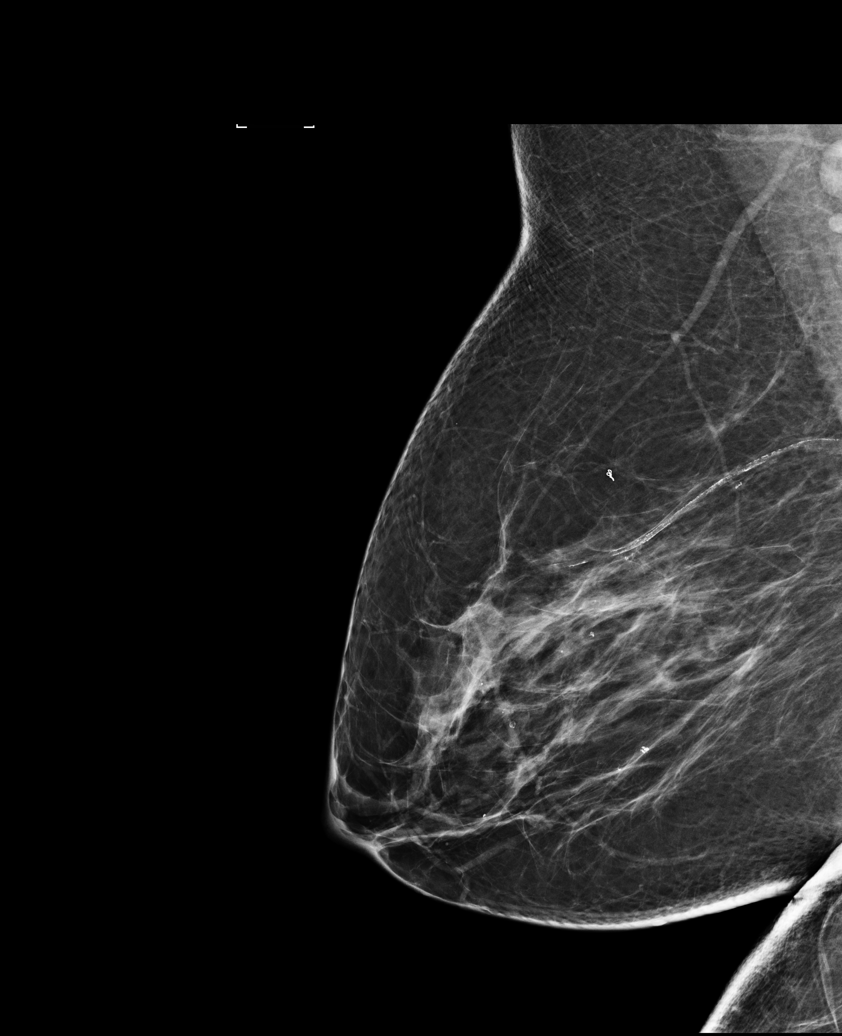

[R MLO (2 of 2)]
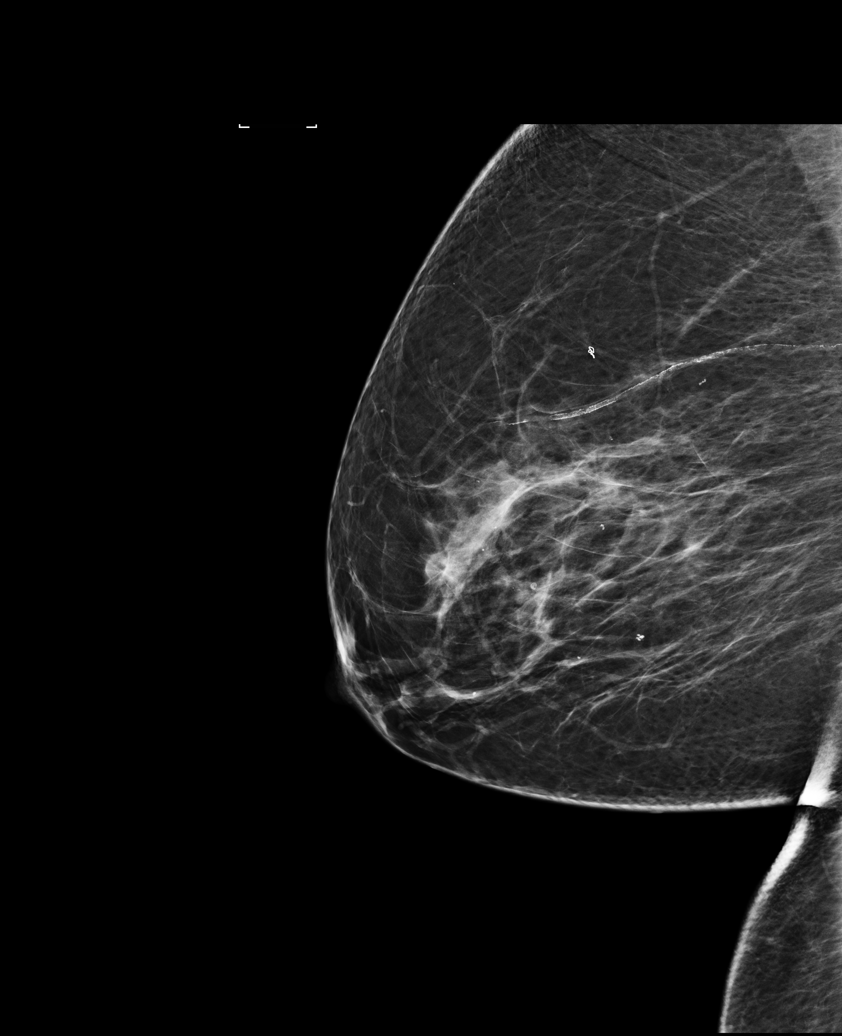

[R CC]
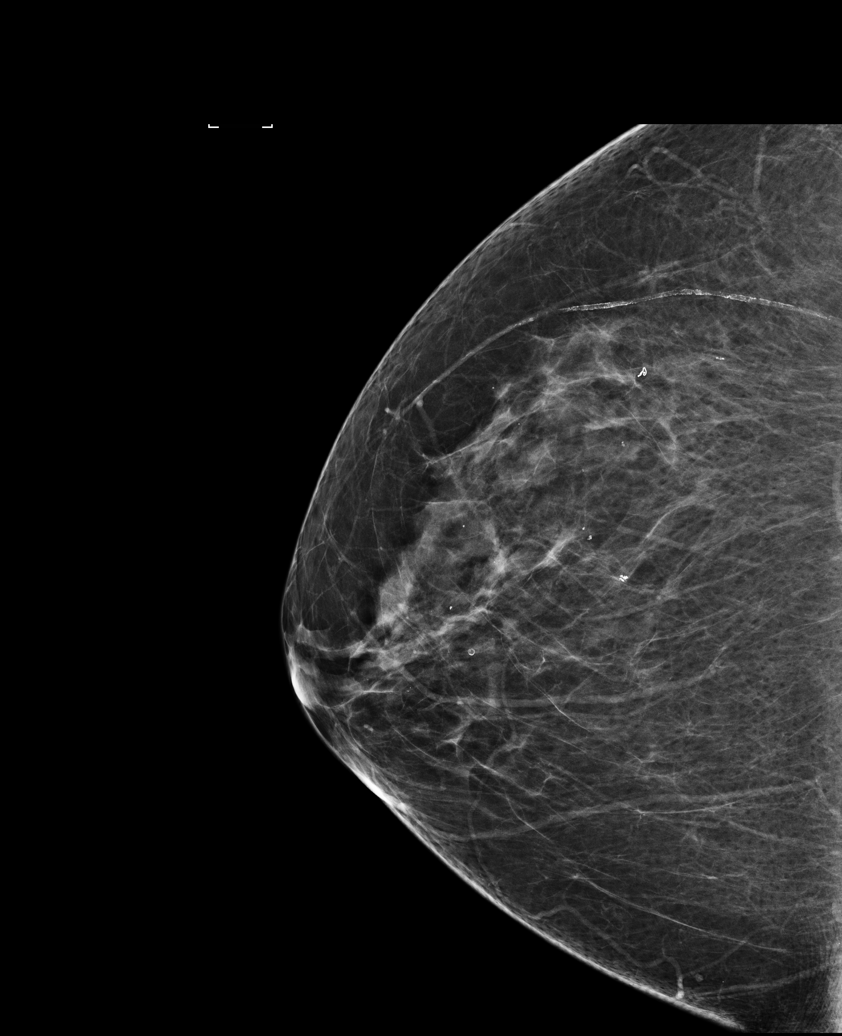

[L MLO (1 of 2)]
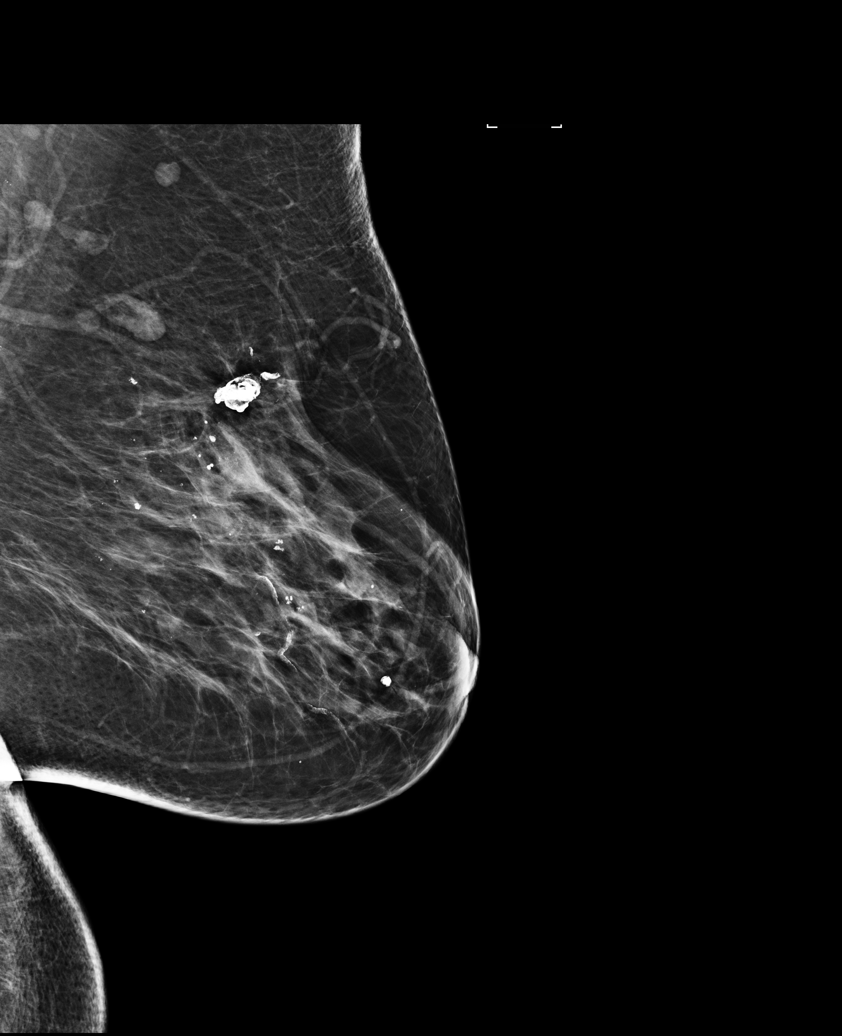

[L MLO (2 of 2)]
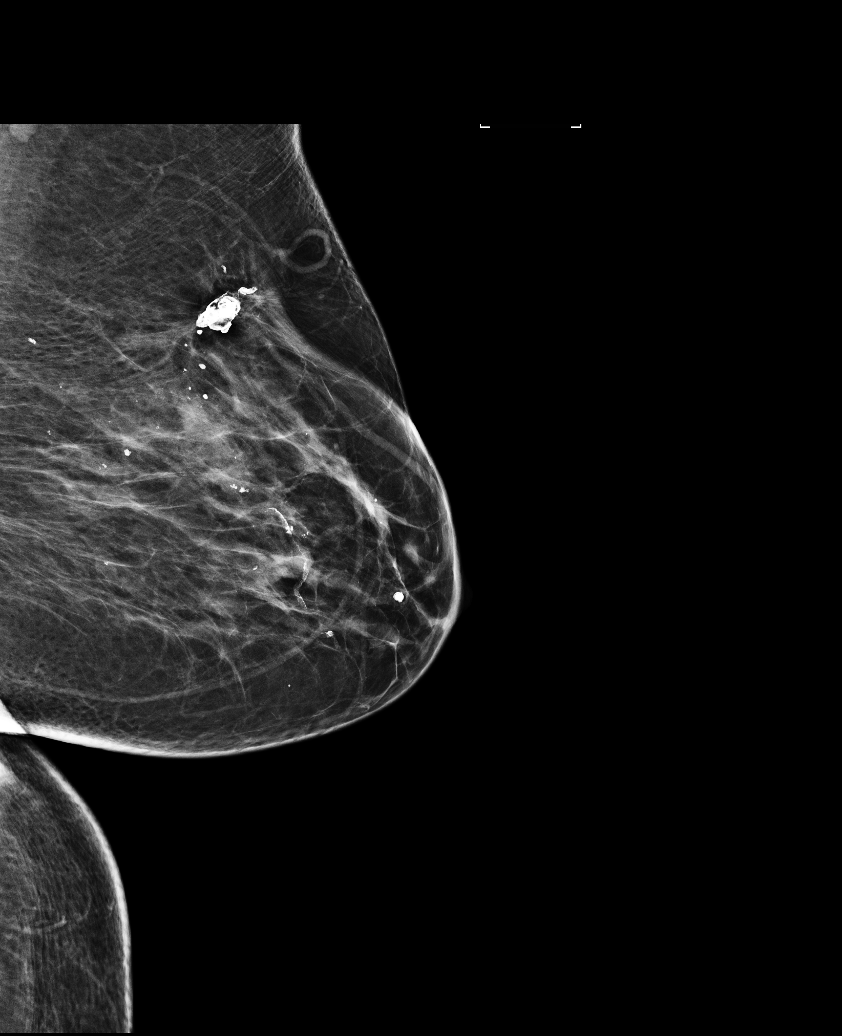

[L CC]
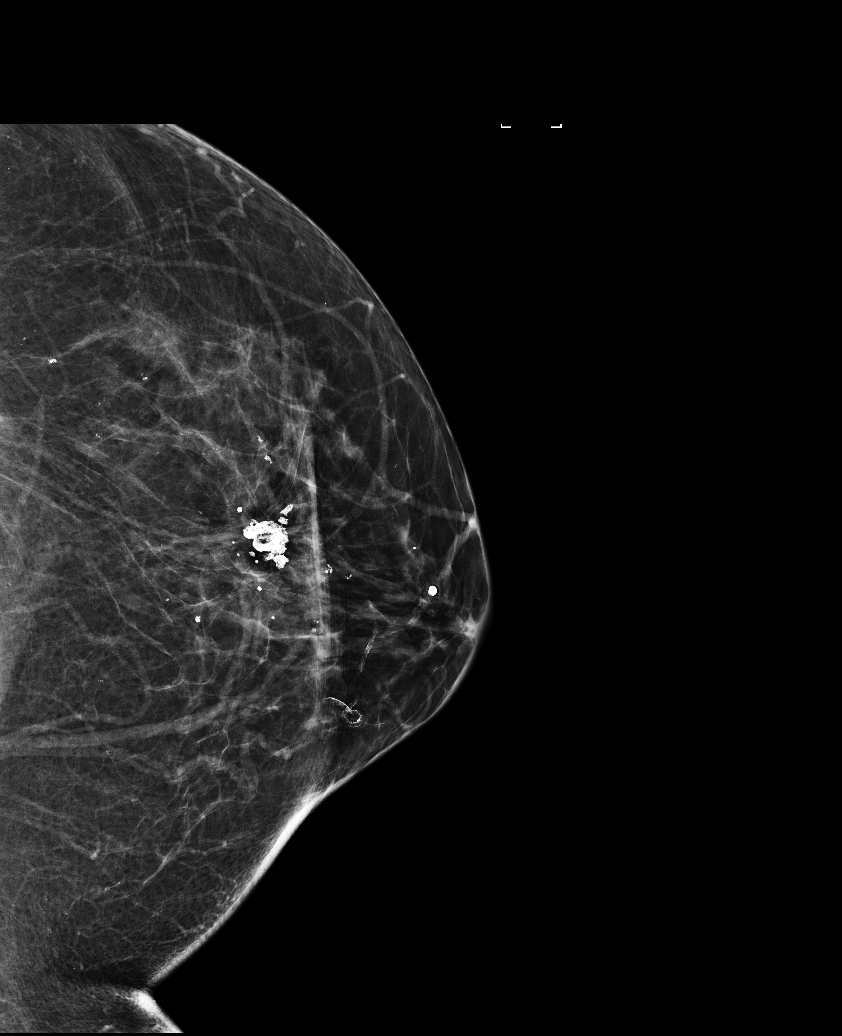

[6 of 6 positions shown; findings below may reference images not displayed]

ACR Breast Density Category b: There are scattered areas of
fibroglandular density.
FINDINGS: There are no findings suspicious for malignancy. Images were
processed with CAD.
IMPRESSION: No mammographic evidence of malignancy. A result letter of this
screening mammogram will be mailed directly to the patient.

RECOMMENDATION:
Screening mammogram in one year. (Code:AS-G-LCT)

BI-RADS CATEGORY  1: Negative.

## 2017-10-17 DIAGNOSIS — M4696 Unspecified inflammatory spondylopathy, lumbar region: Secondary | ICD-10-CM | POA: Diagnosis not present

## 2017-10-17 DIAGNOSIS — I872 Venous insufficiency (chronic) (peripheral): Secondary | ICD-10-CM | POA: Diagnosis not present

## 2017-10-17 DIAGNOSIS — M47816 Spondylosis without myelopathy or radiculopathy, lumbar region: Secondary | ICD-10-CM | POA: Insufficient documentation

## 2017-10-29 DIAGNOSIS — K219 Gastro-esophageal reflux disease without esophagitis: Secondary | ICD-10-CM | POA: Diagnosis not present

## 2017-10-29 DIAGNOSIS — I10 Essential (primary) hypertension: Secondary | ICD-10-CM | POA: Diagnosis not present

## 2017-10-29 DIAGNOSIS — E538 Deficiency of other specified B group vitamins: Secondary | ICD-10-CM | POA: Diagnosis not present

## 2017-10-29 DIAGNOSIS — E119 Type 2 diabetes mellitus without complications: Secondary | ICD-10-CM | POA: Diagnosis not present

## 2017-10-29 DIAGNOSIS — Z794 Long term (current) use of insulin: Secondary | ICD-10-CM | POA: Diagnosis not present

## 2017-11-02 DIAGNOSIS — M4696 Unspecified inflammatory spondylopathy, lumbar region: Secondary | ICD-10-CM | POA: Diagnosis not present

## 2017-11-06 ENCOUNTER — Other Ambulatory Visit: Payer: Self-pay | Admitting: Specialist

## 2017-11-06 DIAGNOSIS — M4696 Unspecified inflammatory spondylopathy, lumbar region: Secondary | ICD-10-CM

## 2017-11-07 DIAGNOSIS — M545 Low back pain: Secondary | ICD-10-CM | POA: Diagnosis not present

## 2017-11-07 DIAGNOSIS — R1031 Right lower quadrant pain: Secondary | ICD-10-CM | POA: Diagnosis not present

## 2017-11-12 DIAGNOSIS — M4696 Unspecified inflammatory spondylopathy, lumbar region: Secondary | ICD-10-CM | POA: Diagnosis not present

## 2017-11-16 DIAGNOSIS — M4696 Unspecified inflammatory spondylopathy, lumbar region: Secondary | ICD-10-CM | POA: Diagnosis not present

## 2017-11-26 DIAGNOSIS — M5416 Radiculopathy, lumbar region: Secondary | ICD-10-CM | POA: Diagnosis not present

## 2017-12-11 DIAGNOSIS — I10 Essential (primary) hypertension: Secondary | ICD-10-CM | POA: Diagnosis not present

## 2017-12-11 DIAGNOSIS — M4696 Unspecified inflammatory spondylopathy, lumbar region: Secondary | ICD-10-CM | POA: Diagnosis not present

## 2017-12-11 DIAGNOSIS — M5136 Other intervertebral disc degeneration, lumbar region: Secondary | ICD-10-CM | POA: Diagnosis not present

## 2017-12-11 DIAGNOSIS — E119 Type 2 diabetes mellitus without complications: Secondary | ICD-10-CM | POA: Diagnosis not present

## 2017-12-11 DIAGNOSIS — Z794 Long term (current) use of insulin: Secondary | ICD-10-CM | POA: Diagnosis not present

## 2017-12-11 DIAGNOSIS — F4329 Adjustment disorder with other symptoms: Secondary | ICD-10-CM | POA: Diagnosis not present

## 2017-12-11 DIAGNOSIS — M47816 Spondylosis without myelopathy or radiculopathy, lumbar region: Secondary | ICD-10-CM | POA: Diagnosis not present

## 2017-12-13 ENCOUNTER — Other Ambulatory Visit: Payer: Self-pay

## 2017-12-13 ENCOUNTER — Emergency Department
Admission: EM | Admit: 2017-12-13 | Discharge: 2017-12-13 | Disposition: A | Discharge status: Home / Self Care | Payer: PPO | Attending: Emergency Medicine | Admitting: Emergency Medicine

## 2017-12-13 ENCOUNTER — Emergency Department: Payer: PPO

## 2017-12-13 DIAGNOSIS — R1110 Vomiting, unspecified: Secondary | ICD-10-CM

## 2017-12-13 DIAGNOSIS — R112 Nausea with vomiting, unspecified: Secondary | ICD-10-CM | POA: Diagnosis not present

## 2017-12-13 DIAGNOSIS — Z85118 Personal history of other malignant neoplasm of bronchus and lung: Secondary | ICD-10-CM | POA: Insufficient documentation

## 2017-12-13 DIAGNOSIS — Z79899 Other long term (current) drug therapy: Secondary | ICD-10-CM | POA: Insufficient documentation

## 2017-12-13 DIAGNOSIS — R197 Diarrhea, unspecified: Secondary | ICD-10-CM | POA: Insufficient documentation

## 2017-12-13 DIAGNOSIS — I10 Essential (primary) hypertension: Secondary | ICD-10-CM | POA: Insufficient documentation

## 2017-12-13 DIAGNOSIS — E119 Type 2 diabetes mellitus without complications: Secondary | ICD-10-CM | POA: Diagnosis not present

## 2017-12-13 DIAGNOSIS — F329 Major depressive disorder, single episode, unspecified: Secondary | ICD-10-CM | POA: Insufficient documentation

## 2017-12-13 DIAGNOSIS — J45909 Unspecified asthma, uncomplicated: Secondary | ICD-10-CM | POA: Diagnosis not present

## 2017-12-13 DIAGNOSIS — Z87891 Personal history of nicotine dependence: Secondary | ICD-10-CM | POA: Diagnosis not present

## 2017-12-13 DIAGNOSIS — Z853 Personal history of malignant neoplasm of breast: Secondary | ICD-10-CM | POA: Diagnosis not present

## 2017-12-13 DIAGNOSIS — R457 State of emotional shock and stress, unspecified: Secondary | ICD-10-CM | POA: Diagnosis not present

## 2017-12-13 LAB — COMPREHENSIVE METABOLIC PANEL
ALBUMIN: 3.4 g/dL — AB (ref 3.5–5.0)
ALT: 23 U/L (ref 0–44)
AST: 33 U/L (ref 15–41)
Alkaline Phosphatase: 57 U/L (ref 38–126)
Anion gap: 12 (ref 5–15)
BUN: 16 mg/dL (ref 8–23)
CHLORIDE: 97 mmol/L — AB (ref 98–111)
CO2: 26 mmol/L (ref 22–32)
Calcium: 8.6 mg/dL — ABNORMAL LOW (ref 8.9–10.3)
Creatinine, Ser: 0.91 mg/dL (ref 0.44–1.00)
GFR calc Af Amer: >60 mL/min (ref >60)
GFR calc non Af Amer: 59 mL/min — ABNORMAL LOW (ref >60)
Glucose, Bld: 144 mg/dL — ABNORMAL HIGH (ref 70–99)
POTASSIUM: 3.4 mmol/L — AB (ref 3.5–5.1)
Sodium: 135 mmol/L (ref 135–145)
Total Bilirubin: 0.8 mg/dL (ref 0.3–1.2)
Total Protein: 6.2 g/dL — ABNORMAL LOW (ref 6.5–8.1)

## 2017-12-13 LAB — CBC
HEMATOCRIT: 38.8 % (ref 35.0–47.0)
Hemoglobin: 13.5 g/dL (ref 12.0–16.0)
MCH: 29.7 pg (ref 26.0–34.0)
MCHC: 34.9 g/dL (ref 32.0–36.0)
MCV: 85.4 fL (ref 80.0–100.0)
Platelets: 203 10*3/uL (ref 150–440)
RBC: 4.55 MIL/uL (ref 3.80–5.20)
RDW: 15.1 % — AB (ref 11.5–14.5)
WBC: 5.6 10*3/uL (ref 3.6–11.0)

## 2017-12-13 LAB — TROPONIN I: Troponin I: <0.03 ng/mL (ref <0.03)

## 2017-12-13 LAB — LIPASE, BLOOD: Lipase: 26 U/L (ref 11–51)

## 2017-12-13 MED ORDER — ONDANSETRON HCL 4 MG/2ML IJ SOLN
4.0000 mg | Freq: Once | INTRAMUSCULAR | Status: AC
  Administered 2017-12-13: 4 mg via INTRAVENOUS
  Filled 2017-12-13: qty 2

## 2017-12-13 MED ORDER — ONDANSETRON HCL 4 MG PO TABS: 4.0000 mg | ORAL_TABLET | Freq: Three times a day (TID) | ORAL | 0 refills | Status: DC | PRN

## 2017-12-13 NOTE — ED Notes (Signed)
Pt resting comfortably in bed at this time. TTS in speaking with pt.

## 2017-12-13 NOTE — ED Triage Notes (Signed)
Pt reports increased anxiety. Reports began taking Cymbalta Tuesday and began having diarrhea. Denies any pain or other s/s.

## 2017-12-13 NOTE — ED Notes (Signed)
Kathryn Hammond, with TTS, advised pt has been given information and is cleared with TTS.

## 2017-12-13 NOTE — BH Assessment (Signed)
Per request of ER MD (Lord), writer provided the pt. with information and instructions on how to access [facility]). Writer updated patient's nurse (Devan).  Patient denies SI/HI and AV/H.   _______ [facility] [address] [place], [address] [phone]

## 2017-12-13 NOTE — ED Notes (Signed)
Pt noted to have de-sated to 87% on RA, primary nurse Brooke notified and pt placed on 2L [place]. Now sats 97%

## 2017-12-13 NOTE — ED Notes (Signed)
Pt up to toilet without assistance. Sats dropped to 89% when getting up. MD made aware. Sats are now 94% on RA when resting in the bed.

## 2017-12-13 NOTE — BH Assessment (Signed)
Assessment Note  Kathryn Hammond is an 77 y.o. female who presents to the ER due medical concerns. While in the ER, it was discovered she's dealing with depression due to the recent death of her husband of 77 five years and recent changes in her health. The husband passed May 3rd, 2019. Patient reports of having a panic attack approximately three days ago. Her appetite and sleep have decrease. She having racing thoughts and increase worrying.  Patient reports she recently start having back problems due to a fall she had March 2019. She believe the injury occurred when she was on vacation with her husband and fell.  It didn't cause problems until approximately a month ago. It was discovered she had "compressed discs." Prior to the injury she was able to complete her daily functions and responsibilities without any problems. The pain currently impedes her ability to do things. Thus, the death of her husband and the recent change in her health caused an negative effectives in her mood.  During the interview, the patient was calm, cooperative and pleasant. She was able to provide appropriate answers to the questions. Throughout the interview, she denied SI/HI and AV/H.   Diagnosis: Depression and Adjustment Disorder  Past Medical History:  Past Medical History:  Diagnosis Date  . Arthritis   . Asthma   . Breast cancer ([place]) 2010   left breast cancer-DCIS  . CPAP (continuous positive airway pressure) dependence 15 years  . Depression   . Dermatitis   . Diabetes ([place])    non-insulin dependent  . Dyspnea   . Emphysema   . Fatty liver disease, nonalcoholic   . GERD (gastroesophageal reflux disease)   . History of hiatal hernia   . Hyperlipidemia   . Lung cancer ([place])   . Melanoma ([place])    right temple  . Neuromuscular disorder ([facility])    neuropathy  . Obesity, unspecified   . Personal history of malignant neoplasm of breast    left breast DCIS  . Personal history of malignant neoplasm  of bronchus and lung    right lung cancer  . Personal history of radiation therapy 2010   f/u left breast cancer   . Personal history of tobacco use, presenting hazards to health   . Reflux esophagitis   . Screening for obesity   . Seasonal allergies   . Sleep apnea    cpap  . Ulcer    peptic  . Unspecified essential hypertension   . Varicose veins of lower extremities with other complications     Past Surgical History:  Procedure Laterality Date  . ABDOMINAL HYSTERECTOMY  1972  . BREAST BIOPSY Right   . BREAST BIOPSY Left 09/27/2017   Affirm Bx- Ribbon clip- path pending  . BREAST EXCISIONAL BIOPSY Left 2010   DCIS  . BREAST LUMPECTOMY Left 2010   R for DCIS  . BREAST SURGERY Left 2010   mammosite balloon placement and removal  . CATARACT EXTRACTION W/PHACO Right 09/12/2016   Procedure: CATARACT EXTRACTION PHACO AND INTRAOCULAR LENS PLACEMENT (IOC);  Surgeon: Birder Robson, MD;  Location: [facility] ORS;  Service: Ophthalmology;  Laterality: Right;  [place] 00:38.2 AP% 10.9 CDE 4.16 Fluid Pack lot # [id number]  . CATARACT EXTRACTION W/PHACO Left 10/03/2016   Procedure: CATARACT EXTRACTION PHACO AND INTRAOCULAR LENS PLACEMENT (IOC);  Surgeon: Birder Robson, MD;  Location: [facility] ORS;  Service: Ophthalmology;  Laterality: Left;  [place] 00:37 AP% 20.6 CDE 7.69 Fluid pack lot # [id number]  . COLONOSCOPY  2007 and 2012   Dr. Vira Agar at [facility]; polyps  . COLONOSCOPY WITH PROPOFOL N/A 10/28/2015   Procedure: COLONOSCOPY WITH PROPOFOL;  Surgeon: Manya Silvas, MD;  Location: [facility] ENDOSCOPY;  Service: Endoscopy;  Laterality: N/A;  . ESOPHAGOGASTRODUODENOSCOPY (EGD) WITH PROPOFOL N/A 10/28/2015   Procedure: ESOPHAGOGASTRODUODENOSCOPY (EGD) WITH PROPOFOL;  Surgeon: Manya Silvas, MD;  Location: [facility] ENDOSCOPY;  Service: Endoscopy;  Laterality: N/A;  . EYE SURGERY  1992  . HERNIA REPAIR  1990  . KNEE SURGERY  1999   knee replaced  . KNEE SURGERY  1977   knees replaced  . LEFT HEART CATH AND  CORONARY ANGIOGRAPHY Left 03/05/2017   Procedure: LEFT HEART CATH AND CORONARY ANGIOGRAPHY;  Surgeon: Teodoro Spray, MD;  Location: [place] CV LAB;  Service: Cardiovascular;  Laterality: Left;  . LOBECTOMY  1989  . MELANOMA EXCISION  December 07, 2009   right temple  . MOHS SURGERY  2016   [facility], nose  . SALPINGOOPHORECTOMY  1972  . VEIN SURGERY  September 2011   vein closure procedure left leg; RF ablation of GSV in both legs    Family History:  Family History  Problem Relation Age of Onset  . Ovarian cancer Mother   . Breast cancer Neg Hx     Social History:  reports that she quit smoking about 29 years ago. She quit after 30.00 years of use. She has never used smokeless tobacco. She reports that she drinks alcohol. She reports that she does not use drugs.  Additional Social History:  Alcohol / Drug Use Pain Medications: See PTA Prescriptions: See PTA Over the Counter: See PTA History of alcohol / drug use?: No history of alcohol / drug abuse Longest period of sobriety (when/how long): Reports of no past or current use Negative Consequences of Use: (n/a) Withdrawal Symptoms: (n/a)  CIWA: CIWA-Ar BP: (!) 163/75 Pulse Rate: 73 COWS:    Allergies:  Allergies  Allergen Reactions  . Balsam [place]-Castor [Amberderm] Other (See Comments)    Unknown  According to [facility] - positive patch test   . Carafate [Sucralfate] Itching  . Dmdm Hydantoin Other (See Comments)    Unknown  According to [facility] - positive patch tes  . Lisinopril Swelling  . Peanut-Containing Drug Products Swelling  . Pepcid [Famotidine] Itching  . Silver Other (See Comments)    blisters  . Tape Itching    Redness   . Zantac [Ranitidine Hcl] Hives  . Xanax [Alprazolam] Rash    Home Medications:  (Not in a hospital admission)  OB/GYN Status:  No LMP recorded. Patient has had a hysterectomy.  General Assessment Data Location of Assessment: [facility] ED TTS Assessment: In system Is this a Tele or  Face-to-Face Assessment?: Face-to-Face Is this an Initial Assessment or a Re-assessment for this encounter?: Initial Assessment Patient Accompanied by:: Other(Daughter) Language Other than English: No Living Arrangements: Other (Comment)(Private Home) What gender do you identify as?: Female Marital status: Widowed Pregnancy Status: No Living Arrangements: Alone Can pt return to current living arrangement?: Yes Admission Status: Voluntary Is patient capable of signing voluntary admission?: Yes Referral Source: Self/Family/Friend Insurance type: [employer]  Medical Screening Exam ([place]) Medical Exam completed: Yes  Crisis Care Plan Living Arrangements: Alone Legal Guardian: Other:(Self) Name of Psychiatrist: Reports of none Name of Therapist: Reports of none  Education Status Is patient currently in school?: No Is the patient employed, unemployed or receiving disability?: Unemployed  Risk to self with the past 6 months  Suicidal Ideation: No Has patient been a risk to self within the past 6 months prior to admission? : No Suicidal Intent: No Has patient had any suicidal intent within the past 6 months prior to admission? : No Is patient at risk for suicide?: No Suicidal Plan?: No Has patient had any suicidal plan within the past 6 months prior to admission? : No Access to Means: No What has been your use of drugs/alcohol within the last 12 months?: Reports of none Previous Attempts/Gestures: No How many times?: 0 Other Self Harm Risks: Reports of none Triggers for Past Attempts: None known Intentional Self Injurious Behavior: None Family Suicide History: No Recent stressful life event(s): Loss (Comment), Recent negative physical changes Persecutory voices/beliefs?: No Depression: Yes Depression Symptoms: Insomnia, Tearfulness, Isolating, Fatigue, Feeling worthless/self pity Substance abuse history and/or treatment for substance abuse?: No Suicide  prevention information given to non-admitted patients: Not applicable  Risk to Others within the past 6 months Homicidal Ideation: No Does patient have any lifetime risk of violence toward others beyond the six months prior to admission? : No Thoughts of Harm to Others: No Current Homicidal Intent: No Current Homicidal Plan: No Access to Homicidal Means: No Identified Victim: Reports of none History of harm to others?: No Assessment of Violence: None Noted Violent Behavior Description: Reports of none Does patient have access to weapons?: No Criminal Charges Pending?: No Does patient have a court date: No Is patient on probation?: No  Psychosis Hallucinations: None noted Delusions: None noted  Mental Status Report Appearance/Hygiene: Unremarkable Eye Contact: Good Motor Activity: Freedom of movement Speech: Logical/coherent, Unremarkable Level of Consciousness: Alert Mood: Depressed, Sad, Pleasant Affect: Appropriate to circumstance, Depressed, Sad Anxiety Level: Minimal Thought Processes: Coherent, Relevant Judgement: Unimpaired Orientation: Person, Place, Time, Situation, Appropriate for developmental age Obsessive Compulsive Thoughts/Behaviors: Minimal  Cognitive Functioning Concentration: Normal Memory: Recent Intact, Remote Intact Is patient IDD: No Insight: Fair Impulse Control: Fair Appetite: Poor Have you had any weight changes? : No Change Sleep: Decreased Total Hours of Sleep: 9(Trouble falling and staying asleep) Vegetative Symptoms: None  ADLScreening [facility] Assessment Services) Patient's cognitive ability adequate to safely complete daily activities?: Yes Patient able to express need for assistance with ADLs?: Yes Independently performs ADLs?: Yes (appropriate for developmental age)  Prior Inpatient Therapy Prior Inpatient Therapy: No  Prior Outpatient Therapy Prior Outpatient Therapy: No Does patient have an ACCT team?: No Does patient have  Intensive In-House Services?  : No Does patient have [facility] services? : No Does patient have P4CC services?: No  ADL Screening (condition at time of admission) Patient's cognitive ability adequate to safely complete daily activities?: Yes Is the patient deaf or have difficulty hearing?: No Does the patient have difficulty seeing, even when wearing glasses/contacts?: No Does the patient have difficulty concentrating, remembering, or making decisions?: No Patient able to express need for assistance with ADLs?: Yes Does the patient have difficulty dressing or bathing?: No Independently performs ADLs?: Yes (appropriate for developmental age) Does the patient have difficulty walking or climbing stairs?: Yes(Due to recent back pain) Weakness of Legs: None Weakness of Arms/Hands: None  Home Assistive Devices/Equipment Home Assistive Devices/Equipment: None  Therapy Consults (therapy consults require a physician order) PT Evaluation Needed: No OT Evalulation Needed: No SLP Evaluation Needed: No Abuse/Neglect Assessment (Assessment to be complete while patient is alone) Abuse/Neglect Assessment Can Be Completed: Yes Physical Abuse: Denies Verbal Abuse: Denies Sexual Abuse: Denies Exploitation of patient/patient's resources: Denies Self-Neglect: Denies Values / Beliefs Cultural Requests During  Hospitalization: None Spiritual Requests During Hospitalization: None Consults Spiritual Care Consult Needed: No Social Work Consult Needed: No Regulatory affairs officer (For Healthcare) Does Patient Have a Medical Advance Directive?: No       Child/Adolescent Assessment Running Away Risk: Denies(Patient is an adult)  Disposition:  Disposition Initial Assessment Completed for this Encounter: Yes Patient referred to: Outpatient clinic referral([facility])  On Site Evaluation by:   Reviewed with Physician:    Gunnar Fusi MS, LCAS, LPC, [place], CCSI Therapeutic Triage  Specialist 12/13/2017 1:27 PM

## 2017-12-13 NOTE — Discharge Instructions (Addendum)
You are evaluated for nausea vomiting diarrhea and your exam evaluation are overall reassuring in the emerge department today.  Return to the emergency room immediately for any worsening condition including chest pain, trouble breathing, abdominal pain, concern for dehydration with ongoing vomiting and diarrhea, fever, dizziness passing out, or any other symptoms concerning to you.  We discussed that stress and anxiety may be contributing related to grief.  Please follow-up with therapist and or grief counselor.

## 2017-12-13 NOTE — ED Provider Notes (Signed)
[facility] Emergency Department Provider Note ____________________________________________   I have reviewed the triage vital signs and the triage nursing note.  HISTORY  Chief Complaint Anxiety and Diarrhea   Historian Patient and daughter  HPI Kathryn Hammond is a 77 y.o. female who lives alone, lost her husband within the last few months, presents today with nausea since yesterday with some mild emesis.  She had decreased p.o. intake.  She has had some watery, nonbloody diarrhea.  No fevers.  She feels extremely anxious.     Past Medical History:  Diagnosis Date  . Arthritis   . Asthma   . Breast cancer ([place]) 2010   left breast cancer-DCIS  . CPAP (continuous positive airway pressure) dependence 15 years  . Depression   . Dermatitis   . Diabetes ([place])    non-insulin dependent  . Dyspnea   . Emphysema   . Fatty liver disease, nonalcoholic   . GERD (gastroesophageal reflux disease)   . History of hiatal hernia   . Hyperlipidemia   . Lung cancer ([place])   . Melanoma ([place])    right temple  . Neuromuscular disorder (HCC)    neuropathy  . Obesity, unspecified   . Personal history of malignant neoplasm of breast    left breast DCIS  . Personal history of malignant neoplasm of bronchus and lung    right lung cancer  . Personal history of radiation therapy 2010   f/u left breast cancer   . Personal history of tobacco use, presenting hazards to health   . Reflux esophagitis   . Screening for obesity   . Seasonal allergies   . Sleep apnea    cpap  . Ulcer    peptic  . Unspecified essential hypertension   . Varicose veins of lower extremities with other complications     Patient Active Problem List   Diagnosis Date Noted  . History of ductal carcinoma in situ (DCIS) of breast 09/22/2017  . Mammographic microcalcification 09/22/2017  . Varicose veins of both lower extremities with inflammation 08/19/2017  . Carotid stenosis 07/10/2016   . Hyperlipidemia 07/10/2016  . Essential hypertension 07/10/2016  . History of breast cancer 08/15/2012  . History of lung cancer 08/15/2012    Past Surgical History:  Procedure Laterality Date  . ABDOMINAL HYSTERECTOMY  1972  . BREAST BIOPSY Right   . BREAST BIOPSY Left 09/27/2017   Affirm Bx- Ribbon clip- path pending  . BREAST EXCISIONAL BIOPSY Left 2010   DCIS  . BREAST LUMPECTOMY Left 2010   R for DCIS  . BREAST SURGERY Left 2010   mammosite balloon placement and removal  . CATARACT EXTRACTION W/PHACO Right 09/12/2016   Procedure: CATARACT EXTRACTION PHACO AND INTRAOCULAR LENS PLACEMENT (IOC);  Surgeon: Birder Robson, MD;  Location: [facility] ORS;  Service: Ophthalmology;  Laterality: Right;  [place] 00:38.2 AP% 10.9 CDE 4.16 Fluid Pack lot # [id number]  . CATARACT EXTRACTION W/PHACO Left 10/03/2016   Procedure: CATARACT EXTRACTION PHACO AND INTRAOCULAR LENS PLACEMENT (IOC);  Surgeon: Birder Robson, MD;  Location: [facility] ORS;  Service: Ophthalmology;  Laterality: Left;  [place] 00:37 AP% 20.6 CDE 7.69 Fluid pack lot # [id number]  . COLONOSCOPY  2007 and 2012   Dr. Vira Agar at [facility]; polyps  . COLONOSCOPY WITH PROPOFOL N/A 10/28/2015   Procedure: COLONOSCOPY WITH PROPOFOL;  Surgeon: Manya Silvas, MD;  Location: [facility] ENDOSCOPY;  Service: Endoscopy;  Laterality: N/A;  . ESOPHAGOGASTRODUODENOSCOPY (EGD) WITH PROPOFOL N/A 10/28/2015   Procedure: ESOPHAGOGASTRODUODENOSCOPY (EGD)  WITH PROPOFOL;  Surgeon: Manya Silvas, MD;  Location: [facility] ENDOSCOPY;  Service: Endoscopy;  Laterality: N/A;  . EYE SURGERY  1992  . HERNIA REPAIR  1990  . KNEE SURGERY  1999   knee replaced  . KNEE SURGERY  1977   knees replaced  . LEFT HEART CATH AND CORONARY ANGIOGRAPHY Left 03/05/2017   Procedure: LEFT HEART CATH AND CORONARY ANGIOGRAPHY;  Surgeon: Teodoro Spray, MD;  Location: [place] CV LAB;  Service: Cardiovascular;  Laterality: Left;  . LOBECTOMY  1989  . MELANOMA EXCISION  December 07, 2009    right temple  . MOHS SURGERY  2016   [facility], nose  . SALPINGOOPHORECTOMY  1972  . VEIN SURGERY  September 2011   vein closure procedure left leg; RF ablation of GSV in both legs    Prior to Admission medications   Medication Sig Start Date End Date Taking? Authorizing Provider  gabapentin (NEURONTIN) 300 MG capsule Take 300 mg 2 (two) times daily by mouth. May take an additional 300 mgs as needed for pain 06/22/16  Yes [provider]  hydrochlorothiazide (HYDRODIURIL) 12.5 MG tablet Take 12.5 mg at bedtime by mouth.  07/14/13  Yes [provider]  losartan (COZAAR) 100 MG tablet Take 100 mg at bedtime by mouth.  06/11/13  Yes [provider]  metoprolol succinate (TOPROL-XL) 100 MG 24 hr tablet Take 50 mg by mouth at bedtime. Take with or immediately following a meal.   Yes [provider]  acetaminophen (TYLENOL) 500 MG tablet Take 500-1,000 mg 2 (two) times daily as needed by mouth for mild pain.     [provider]  amLODipine (NORVASC) 10 MG tablet Take 5 mg daily by mouth. May take an additional 5 mg at night as needed for high blood pressure    [provider]  APPLE CIDER VINEGAR PO Take 450 mg by mouth daily.    [provider]  ARTIFICIAL TEAR OP Apply 1 drop daily as needed to eye (dry eyes).    [provider]  aspirin 81 MG tablet Take 81 mg by mouth daily.    [provider]  atorvastatin (LIPITOR) 40 MG tablet Take 40 mg at bedtime by mouth.  06/30/13   [provider]  Biotin 5000 MCG CAPS Take 10,000 mcg daily by mouth.     [provider]  cetirizine (ZYRTEC) 10 MG tablet Take by mouth. 07/31/17 07/31/18  [provider]  Cholecalciferol (VITAMIN D3) 2000 units capsule Take 2,000 Units by mouth daily.    [provider]  clonazePAM (KLONOPIN) 0.5 MG tablet Take 0.25-0.5 mg 2 (two) times daily as needed by mouth for anxiety.     [provider]  fluticasone  (FLONASE) 50 MCG/ACT nasal spray Place 2 sprays into both nostrils daily as needed for allergies.     [provider]  Glucosamine HCl (GLUCOSAMINE PO) Take 2,000 mg 3 (three) times a week by mouth.     [provider]  ipratropium (ATROVENT HFA) 17 MCG/ACT inhaler Inhale 2 puffs into the lungs every 6 (six) hours as needed for wheezing.     [provider]  Magnesium 250 MG TABS Take 250 mg by mouth daily.    [provider]  metFORMIN (GLUCOPHAGE-XR) 750 MG 24 hr tablet Take by mouth. 06/22/14   [provider]  montelukast (SINGULAIR) 10 MG tablet Take by mouth. 07/31/17 07/31/18  [provider]  nystatin ointment (MYCOSTATIN) Apply  1 application 3 (three) times daily as needed topically (itching).  02/23/17   [provider]  omeprazole (PRILOSEC) 40 MG capsule Take 40 mg by mouth daily.    [provider]  ondansetron (ZOFRAN) 4 MG tablet Take 1 tablet (4 mg total) by mouth every 8 (eight) hours as needed for nausea or vomiting. 12/13/17   Lisa Roca, MD  Propylene Glycol-Glycerin (SOOTHE OP) Apply 1 drop daily as needed to eye (dry eyes).    [provider]  triamcinolone ointment (KENALOG) 0.1 % Apply 1 application 3 (three) times daily as needed topically (itching).  02/23/17   [provider]  vitamin B-12 (CYANOCOBALAMIN) 1000 MCG tablet Take 1,000 mcg daily by mouth.    [provider]    Allergies  Allergen Reactions  . Balsam [place]-Castor [Amberderm] Other (See Comments)    Unknown  According to [facility] - positive patch test   . Carafate [Sucralfate] Itching  . Dmdm Hydantoin Other (See Comments)    Unknown  According to [facility] - positive patch tes  . Lisinopril Swelling  . Peanut-Containing Drug Products Swelling  . Pepcid [Famotidine] Itching  . Silver Other (See Comments)    blisters  . Tape Itching    Redness   . Zantac [Ranitidine Hcl] Hives  . Xanax [Alprazolam] Rash     Family History  Problem Relation Age of Onset  . Ovarian cancer Mother   . Breast cancer Neg Hx     Social History Social History   Tobacco Use  . Smoking status: Former Smoker    Years: 30.00    Last attempt to quit: 04/10/1988    Years since quitting: 29.6  . Smokeless tobacco: Never Used  Substance Use Topics  . Alcohol use: Yes    Alcohol/week: 0.0 standard drinks    Comment: once in a while  . Drug use: No    Review of Systems  Constitutional: Negative for fever. Eyes: Negative for visual changes. ENT: Negative for sore throat. Cardiovascular: Negative for chest pain. Respiratory: Negative for shortness of breath. Gastrointestinal: Negative for abdominal pain, positive for nausea vomiting diarrhea as per HPI. Genitourinary: Negative for dysuria. Musculoskeletal: Negative for back pain. Skin: Negative for rash. Neurological: Negative for headache.  ____________________________________________   PHYSICAL EXAM:  VITAL SIGNS: ED Triage Vitals  Enc Vitals Group     BP 12/13/17 0739 (!) 160/84     Pulse Rate 12/13/17 0739 82     Resp 12/13/17 0739 18     Temp 12/13/17 0739 98.4 F (36.9 C)     Temp src --      SpO2 12/13/17 0739 97 %     Weight 12/13/17 0740 190 lb (86.2 kg)     Height --      Head Circumference --      Peak Flow --      Pain Score 12/13/17 0740 0     Pain Loc --      Pain Edu? --      Excl. in [place]? --      Constitutional: Alert and oriented.  Anxious. HEENT      Head: Normocephalic and atraumatic.      Eyes: Conjunctivae are normal. Pupils equal and round.       Ears:         Nose: No congestion/rhinnorhea.      Mouth/Throat: Mucous membranes are moist.      Neck: No stridor. Cardiovascular/Chest: Normal rate, regular rhythm.  No murmurs,  rubs, or gallops. Respiratory: Normal respiratory effort without tachypnea nor retractions. Breath sounds are clear and equal bilaterally. No wheezes/rales/rhonchi. Gastrointestinal: Soft. No  distention, no guarding, no rebound. Nontender.  Genitourinary/rectal:Deferred Musculoskeletal: Nontender with normal range of motion in all extremities. No joint effusions.  No lower extremity tenderness.  No edema. Neurologic:  Normal speech and language. No gross or focal neurologic deficits are appreciated. Skin:  Skin is warm, dry and intact. No rash noted. Psychiatric: Anxious.  No suicidal or homicidal ideation.   ____________________________________________  LABS (pertinent positives/negatives) I, Lisa Roca, MD the attending physician have reviewed the labs noted below.  Labs Reviewed  COMPREHENSIVE METABOLIC PANEL - Abnormal; Notable for the following components:      Result Value   Potassium 3.4 (*)    Chloride 97 (*)    Glucose, Bld 144 (*)    Calcium 8.6 (*)    Total Protein 6.2 (*)    Albumin 3.4 (*)    GFR calc non Af Amer 59 (*)    All other components within normal limits  CBC - Abnormal; Notable for the following components:   RDW 15.1 (*)    All other components within normal limits  LIPASE, BLOOD  TROPONIN I    ____________________________________________    EKG I, Lisa Roca, MD, the attending physician have personally viewed and interpreted all ECGs.  73 bpm.  Normal sinus rhythm.  Narrow QS per normal axis.  Normal ST and T wave ____________________________________________  RADIOLOGY   Chest x-ray reviewed by me reviewed radiologist report.  No acute finding __________________________________________  PROCEDURES  Procedure(s) performed: None  Procedures  Critical Care performed: None   ____________________________________________  ED COURSE / ASSESSMENT AND PLAN  Pertinent labs & imaging results that were available during my care of the patient were reviewed by me and considered in my medical decision making (see chart for details).     Patient here stating she is not sure if she is having a panic attack.  She says she is not  sure if she is having a reaction to medications.  Her complaint is feeling of anxiety, nausea, vomiting and diarrhea.  She is had some anxiety for several weeks, but overnight she had nausea vomiting and diarrhea which was watery.  No fever.  No bad food.  No indication that she is having a source for infectious illness although viral GI illness is possible.  She is quite anxious, and her daughter states that the symptoms started after she lost her husband fairly recently.  She states that she was told that she should see a therapist but she has not done that.  But no chest pain.  I will add on EKG and troponin given her age and nonspecific nausea/vomiting.  I am going to have TTS behavioral health consult for resources regarding anxiety and grief resources.    CONSULTATIONS: TTS  Patient / Family / Caregiver informed of clinical course, medical decision-making process, and agree with plan.   I discussed return precautions, follow-up instructions, and discharge instructions with patient and/or family.  Discharge Instructions : You are evaluated for nausea vomiting diarrhea and your exam evaluation are overall reassuring in the emerge department today.  Return to the emergency room immediately for any worsening condition including chest pain, trouble breathing, abdominal pain, concern for dehydration with ongoing vomiting and diarrhea, fever, dizziness passing out, or any other symptoms concerning to you.  We discussed that stress and anxiety may be contributing related to grief.  Please follow-up with therapist and or grief counselor.    ___________________________________________   FINAL CLINICAL IMPRESSION(S) / ED DIAGNOSES   Final diagnoses:  Vomiting and diarrhea      ___________________________________________         Note: This dictation was prepared with Dragon dictation. Any transcriptional errors that result from this process are unintentional    Lisa Roca, MD 12/13/17 1408

## 2017-12-17 ENCOUNTER — Other Ambulatory Visit: Payer: Self-pay

## 2017-12-17 NOTE — Patient Outreach (Signed)
[facility]) Care Management  12/17/2017  Kathryn Hammond [birth year] [id number]  TELEPHONE SCREENING Referral date: 12/17/17 Referral source: Utilization management referral Referral reason: concerned with being alone, concerned with medications Insurance: [employer] advantage  Telephone call to patient regarding utilization management referral. HIPAA verified by patient. Explained reason for call. Patient states her daughter is with her this morning but most of the time she is by herself. Patient states she has had some nausea and diarrhea this  Morning. States she has just not felt good. Patient states, " I feel like my pressure may be up but I didn't take it because it just upsets me if it is up." Patient states she normally takes 1/2 pill of her blood pressure medication but states she took a whole pill this morning. Patient states she can do this as directed by her doctor. Patient states she took medication for nausea.  She reports she called her primary MD office today but she is not able to be seen until Wednesday 12/19/17.  Patient states she was prescribed a medication by her doctor last Monday. She states she stopped taking it because it made her feel funny. Patient states she has been on a lot of pain medication due to having back pain. Patient states she has stopped all of it because she started feeling jittery and anxious.  Patient states she lost her husband in May 2019 but she was doing well until recently when she started having back pain, taking pain medication and feeling jittery and anxious. Patient states she has a follow up appointment with her primary MD on Wednesday 12/19/17.  She states she has put the pain medications in a bag and will be taking them to her primary MD. Patient states she is normally not like this. She states she has been independent, staying busy and doing fine. Patient reports she has an appointment with the spine surgeon on 12/23/17.  RNCM  discussed and offered [facility] care management services. Patient agreeable to follow up with the pharmacist and social worker.   PLAN; RNCm will refer patient to [facility] pharmacist and social worker.   Quinn Plowman RN,BSN,CCM [facility] Telephonic  [phone]        PLAN:

## 2017-12-18 ENCOUNTER — Other Ambulatory Visit: Payer: Self-pay | Admitting: Pharmacist

## 2017-12-18 NOTE — Patient Outreach (Addendum)
[facility]) Care Management  12/18/2017   Kathryn Hammond 1940-12-09 [id number]  Subjective:   77 year old female referred to [place] Management by utilization management. [place] services requested for medication review.  PMHx includes, but not limited to, HTN, HLD, hx breast cancer.   Contacted patient, HIPAA verifiers identified. Kathryn Hammond notes that she is concerned about her medications, particularly all of the pain medications she is currently prescribed. She notes that she was started on duloxetine last week, but has recently become very jittery and anxious after taking it for 4 days. She believed this feeling was due to "overdosing" on pain medications, so stopped duloxetine, tramadol, tizanidine, gabapentin, and diclofenac on Friday. She has not taken any pain medications since then.  She checked her BP while we were on the phone; she reports a reading of 178/84. While reviewing her medications, she noted that she is unsure if she took her "fluid pill" (HCTZ) this morning, and she does not think she took her losartan last night. She notes that she does use a pill box to organize her medications, but was unable to verbalize how she chooses to skip certain medications on some days.  She denies chest pain//pressure, radiating pain/pressure dizziness, or headaches.  Objective:   Current Medications:  Current Outpatient Medications  Medication Sig Dispense Refill  . acetaminophen (TYLENOL) 500 MG tablet Take 500-1,000 mg 2 (two) times daily as needed by mouth for mild pain.     Marland Kitchen amLODipine (NORVASC) 10 MG tablet Take 5 mg daily by mouth. May take an additional 5 mg at night as needed for high blood pressure    . ARTIFICIAL TEAR OP Apply 1 drop daily as needed to eye (dry eyes).    Marland Kitchen atorvastatin (LIPITOR) 40 MG tablet Take 40 mg at bedtime by mouth.     . hydrochlorothiazide (HYDRODIURIL) 12.5 MG tablet Take 12.5 mg at bedtime by mouth.     . losartan  (COZAAR) 100 MG tablet Take 100 mg at bedtime by mouth.     . metFORMIN (GLUCOPHAGE-XR) 750 MG 24 hr tablet Take 750 mg by mouth 2 (two) times daily.     . metoprolol succinate (TOPROL-XL) 100 MG 24 hr tablet Take 50 mg by mouth at bedtime. Take with or immediately following a meal.    . Biotin 5000 MCG CAPS Take 10,000 mcg daily by mouth.     . Cholecalciferol (VITAMIN D3) 2000 units capsule Take 2,000 Units by mouth daily.    . clonazePAM (KLONOPIN) 0.5 MG tablet Take 0.5 mg by mouth daily as needed for anxiety.     . diclofenac (VOLTAREN) 75 MG EC tablet Take 75 mg by mouth 2 (two) times daily.    . DULoxetine (CYMBALTA) 30 MG capsule Take 30 mg by mouth daily.    . fluticasone (FLONASE) 50 MCG/ACT nasal spray Place 2 sprays into both nostrils daily as needed for allergies.     Marland Kitchen gabapentin (NEURONTIN) 300 MG capsule See admin instructions. Take 1 capsule (300MG ) by mouth twice daily - may take an additional capsule daily if needed    . Glucosamine HCl (GLUCOSAMINE PO) Take 2,000 mg 3 (three) times a week by mouth.     Marland Kitchen ipratropium (ATROVENT HFA) 17 MCG/ACT inhaler Inhale 2 puffs into the lungs every 6 (six) hours as needed for wheezing.     . Magnesium 250 MG TABS Take 250 mg by mouth daily.    Marland Kitchen nystatin ointment (MYCOSTATIN) Apply  1 application 3 (three) times daily as needed topically (itching).   2  . omeprazole (PRILOSEC) 40 MG capsule Take 40 mg by mouth daily.    . ondansetron (ZOFRAN) 4 MG tablet Take 1 tablet (4 mg total) by mouth every 8 (eight) hours as needed for nausea or vomiting. 10 tablet 0  . Propylene Glycol-Glycerin (SOOTHE OP) Apply 1 drop daily as needed to eye (dry eyes).    Marland Kitchen tiZANidine (ZANAFLEX) 2 MG tablet Take 2 mg by mouth 3 (three) times daily as needed for muscle spasms.  0  . traMADol (ULTRAM) 50 MG tablet Take 50 mg by mouth every 6 (six) hours as needed for pain.  1  . triamcinolone ointment (KENALOG) 0.1 % Apply 1 application 3 (three) times daily as needed  topically (itching).   1  . vitamin B-12 (CYANOCOBALAMIN) 1000 MCG tablet Take 1,000 mcg daily by mouth.     No current facility-administered medications for this visit.     Functional Status:  In your present state of health, do you have any difficulty performing the following activities: 12/13/2017  Hearing? N  Vision? N  Difficulty concentrating or making decisions? N  Walking or climbing stairs? Y  Comment Due to recent back pain  Dressing or bathing? N  Some recent data might be hidden    Fall/Depression Screening: No flowsheet data found. PHQ 2/9 Scores 12/17/2017  PHQ - 2 Score 2    Assessment:  - Patient appears to have a history of anxiety and depression. It is possible that initiation of Cymbalta (duloxetine) did result in temporary increases in anxiety, BP and HR, and subsequent discontinuation of pain medications is causing her current anxiety/jitteriness.  - Patient was unaware that her metoprolol, losartan, and HCTZ can impact her blood pressure, and expressed that she occasionally misses these. Educated patient on the indications of her prescription medications and counseled on the importance of medication adherence.  Plan:  - Emphasized the importance of medication adherence - Advised patient to wait for instruction from Dr. Reuel Boom office regarding whether she should start back any/all of her pain medications - Will route visit note to PCP's office for information    Catie Darnelle Maffucci, PharmD PGY2 Ambulatory Care Pharmacy Resident Phone: [phone]

## 2017-12-19 ENCOUNTER — Other Ambulatory Visit: Payer: Self-pay | Admitting: *Deleted

## 2017-12-19 DIAGNOSIS — I10 Essential (primary) hypertension: Secondary | ICD-10-CM | POA: Diagnosis not present

## 2017-12-19 DIAGNOSIS — F419 Anxiety disorder, unspecified: Secondary | ICD-10-CM | POA: Diagnosis not present

## 2017-12-19 NOTE — Patient Outreach (Signed)
[facility]) Care Management  12/19/2017  Kathryn Hammond 04/21/1940 [id number]   CSW attempted to reach pt today by phone. A HIPPA compliant voice message was left. CSW will mail pt an unsuccessful outreach attempt letter and try again later this week.   Eduard Clos, MSW, [place] Worker  [place] [phone]

## 2017-12-20 ENCOUNTER — Other Ambulatory Visit: Payer: Self-pay | Admitting: *Deleted

## 2017-12-20 NOTE — Patient Outreach (Signed)
[facility]) Care Management  12/20/2017  TOSHIBA NULL [birth year] [id number]   CSW made initial contact with patient on 12/19/2017 and confirmed pt identity. CSW introduced self, role and reason for calling; need for support related to anxiety and grief.  Pt reports she has had significant improvement with her anxiety; stating she had "cold [place]" stopped taking some of her medications; including her pain meds,  because she was afraid of the side effects and becoming dependent. She has since started a new regimen and indicates the symptoms have improved/decreased significantly.  She has been proactive in communicating with her MD's and denies any need for intervention related to anxiety at this time.  She also shared with CSW that she has been struggling with grief over the loss of her husband of 40+ years. She has already spoken with Hospice staff and plans to consider one on one grief counseling vs group/support.  CSW praised patient for her steps to improve her overall health and well-being. CSW validated and educated pt on grief and the overlap with her health and mental health wellness. She feels she is moving in the right direction and is equipped to do so.   CSW discussed plans to close referral and she will reach out to CSW, PCP or [facility] staff if needs arise.   CSW will advise [facility] team and PCP of above plans.   Eduard Clos, MSW, [place] Worker  [place] [phone]

## 2017-12-21 ENCOUNTER — Ambulatory Visit: Payer: PPO | Admitting: Pharmacist

## 2017-12-21 ENCOUNTER — Other Ambulatory Visit: Payer: Self-pay | Admitting: Pharmacist

## 2017-12-21 ENCOUNTER — Ambulatory Visit: Payer: PPO | Admitting: *Deleted

## 2017-12-21 DIAGNOSIS — M6281 Muscle weakness (generalized): Secondary | ICD-10-CM | POA: Diagnosis not present

## 2017-12-21 DIAGNOSIS — R293 Abnormal posture: Secondary | ICD-10-CM | POA: Diagnosis not present

## 2017-12-21 DIAGNOSIS — M5416 Radiculopathy, lumbar region: Secondary | ICD-10-CM | POA: Diagnosis not present

## 2017-12-21 DIAGNOSIS — M545 Low back pain: Secondary | ICD-10-CM | POA: Diagnosis not present

## 2017-12-21 NOTE — Patient Outreach (Signed)
[facility]) Care Management  12/21/2017  ANAMARIA DUSENBURY [birth year] [id number]   Contacted patient to f/u on medication concerns. Left HIPAA compliant message to return my call.   Catie Darnelle Maffucci, PharmD PGY2 Ambulatory Care Pharmacy Resident Phone: [phone]

## 2017-12-21 NOTE — Patient Outreach (Signed)
[facility]) Care Management  12/21/2017  Kathryn Hammond May 30, 1940 [id number]  Received call back from patient. HIPAA identifiers verified. She noted that she did hear back from PCP Dr. Lovie Macadamia, and he advised that she restart diclofenac daily, using acetaminophen PRN for pain, and tramadol PRN if pain is still uncontrolled.  She notes that she is feeling better overall, and more in control. She does note significant leg pain when she went to therapy, but notes that this improved with movement. She mentioned that she might take ibuprofen for excessive pain; I counseled to avoid ibuprofen and naproxen with concurrent diclofenac therapy. We discussed the maximum recommended Tylenol dosing per day (3.5-4 g).   Kathryn Hammond did note that she has a question about a bill she received from [place] regarding services her husband received. She asked if I knew the social worker that she spoke to yesterday, and if she could answer some questions. Will route note to Eduard Clos, LCSW.   Plan - Will route case closure letter to PCP d/t goals being met - Happy to reopen case in the future if needed   Catie Darnelle Maffucci, PharmD PGY2 Ambulatory Care Pharmacy Resident Phone: [phone]

## 2017-12-21 NOTE — Patient Outreach (Signed)
[place] [facility] [facility]) Care Management  12/21/2017  Kathryn Hammond 1940/11/07 [id number]   Incoming call from Arthur Holms in response to the [facility] Medication Adherence Campaign. Speak with patient. HIPAA identifiers verified and verbal consent received.  Kathryn Hammond reports that she takes her losartan 100 mg once daily as directed. Reports that she had been missing doses earlier in the year, but that she is currently using a weekly pillbox to organize her medications. Counsel patient on the importance of medication adherence. Also let patient know about bubble packaging options that are available. Patient expresses interest in having her medications packaged. However, states that first she wants to keep using her pillbox and see how that goes. Offer to call to follow up with patient in 2 weeks.  Kathryn Hammond also notes that she had been working with [facility] for medication management.  Patient denies any further medication questions/concerns at this time. Provide patient with my phone number.   PLAN  Will call to follow up with Kathryn Hammond in 2 weeks as planned.   Harlow Asa, PharmD, [place] [facility] [phone]

## 2017-12-25 DIAGNOSIS — M545 Low back pain: Secondary | ICD-10-CM | POA: Diagnosis not present

## 2017-12-25 DIAGNOSIS — M25561 Pain in right knee: Secondary | ICD-10-CM | POA: Diagnosis not present

## 2017-12-26 ENCOUNTER — Other Ambulatory Visit: Payer: Self-pay | Admitting: *Deleted

## 2017-12-31 DIAGNOSIS — M5416 Radiculopathy, lumbar region: Secondary | ICD-10-CM | POA: Diagnosis not present

## 2017-12-31 DIAGNOSIS — M6281 Muscle weakness (generalized): Secondary | ICD-10-CM | POA: Diagnosis not present

## 2017-12-31 DIAGNOSIS — M545 Low back pain: Secondary | ICD-10-CM | POA: Diagnosis not present

## 2017-12-31 DIAGNOSIS — R293 Abnormal posture: Secondary | ICD-10-CM | POA: Diagnosis not present

## 2018-01-04 ENCOUNTER — Other Ambulatory Visit: Payer: Self-pay | Admitting: Pharmacist

## 2018-01-04 NOTE — Patient Outreach (Signed)
[facility]) Care Management  01/04/2018  Kathryn Hammond 1940/07/25 [id number]   Call to follow up with patient regarding medication adherence. Called and spoke with patient. HIPAA identifiers verified.  Ms. Lasure reports that she is using her weekly pillbox and taking her medications as directed. Denies any missed doses. States that she is not interested in switching to pill packaging at this time. States that she is starting to run low on her losartan and atorvastatin prescriptions. States that she believes that these are both in need of refills from her PCP, but expresses concern because her PCP normally sends her prescriptions to [facility] and it has become harder for her to pick up her medications from this pharmacy. [facility] [place], [place] [facility] she [facility] as it is closer and can deliver her medications. Update patient's preferred pharmacy in [place] accordingly.  Confirm that patient has my phone number.  PLAN  Will call patient's PCP office to request that patient's preferred [facility] [place], [place] in their system and also request that new prescriptions for losartan and atorvastatin be sent into the pharmacy.  Harlow Asa, PharmD, [place] Management [phone]

## 2018-01-04 NOTE — Patient Outreach (Signed)
[facility]) Care Management  01/04/2018  CLOTIEL TROOP 1941/02/14 [id number]   Call patient's PCP office to request that patient's preferred [facility] [place], [place] [facility] their system and also request that new prescriptions for losartan and atorvastatin be sent into the pharmacy. Leave a message with Misty in the office.  Harlow Asa, PharmD, [place] Management [phone]

## 2018-01-07 ENCOUNTER — Other Ambulatory Visit: Payer: Self-pay | Admitting: Pharmacist

## 2018-01-07 NOTE — Patient Outreach (Addendum)
[facility]) Care Management  01/07/2018  RAYETTA VEITH [birth year] [id number]   [facility] for losartan and atorvastatin for the patient. Speak with Mechele Claude in the pharmacy who confirms that 90 day supplies of each of these prescriptions were sent in for the patient and she picked them up on 01/05/18.  Will close pharmacy episode at this time.  Harlow Asa, PharmD, [place] Management [phone]

## 2018-01-09 DIAGNOSIS — M5416 Radiculopathy, lumbar region: Secondary | ICD-10-CM | POA: Diagnosis not present

## 2018-01-11 ENCOUNTER — Other Ambulatory Visit: Payer: Self-pay | Admitting: Pharmacist

## 2018-01-11 NOTE — Patient Outreach (Signed)
[facility]) Care Management  01/11/2018  Kathryn Hammond 1941/01/08 [id number]   Incoming call from [name]. HIPAA identifiers verified and verbal consent received.  Ms. Punch lets me know that she picked up her losartan and atorvastatin prescriptions [facility]. Patient expresses appreciation for my help. Reports that she [facility] to [facility] that are still active at [facility]. Let patient know that I requested that her PCP update her preferred [facility] in her [facility].  [name] denies any further medication questions/concerns at this time.  Will close pharmacy episode.  Harlow Asa, PharmD, [place] Management [phone]

## 2018-01-23 DIAGNOSIS — I10 Essential (primary) hypertension: Secondary | ICD-10-CM | POA: Diagnosis not present

## 2018-01-23 DIAGNOSIS — G8929 Other chronic pain: Secondary | ICD-10-CM | POA: Diagnosis not present

## 2018-01-23 DIAGNOSIS — Z23 Encounter for immunization: Secondary | ICD-10-CM | POA: Diagnosis not present

## 2018-01-23 DIAGNOSIS — E119 Type 2 diabetes mellitus without complications: Secondary | ICD-10-CM | POA: Diagnosis not present

## 2018-01-23 DIAGNOSIS — E782 Mixed hyperlipidemia: Secondary | ICD-10-CM | POA: Diagnosis not present

## 2018-01-23 DIAGNOSIS — M5441 Lumbago with sciatica, right side: Secondary | ICD-10-CM | POA: Diagnosis not present

## 2018-01-23 DIAGNOSIS — Z794 Long term (current) use of insulin: Secondary | ICD-10-CM | POA: Diagnosis not present

## 2018-01-28 ENCOUNTER — Other Ambulatory Visit: Payer: Self-pay

## 2018-01-28 DIAGNOSIS — Z86000 Personal history of in-situ neoplasm of breast: Secondary | ICD-10-CM

## 2018-01-28 DIAGNOSIS — R920 Mammographic microcalcification found on diagnostic imaging of breast: Secondary | ICD-10-CM

## 2018-01-29 DIAGNOSIS — M5416 Radiculopathy, lumbar region: Secondary | ICD-10-CM | POA: Diagnosis not present

## 2018-02-08 HISTORY — PX: BACK SURGERY: SHX140

## 2018-02-12 DIAGNOSIS — M48061 Spinal stenosis, lumbar region without neurogenic claudication: Secondary | ICD-10-CM | POA: Diagnosis not present

## 2018-02-12 DIAGNOSIS — M5416 Radiculopathy, lumbar region: Secondary | ICD-10-CM | POA: Diagnosis not present

## 2018-02-15 DIAGNOSIS — Z01810 Encounter for preprocedural cardiovascular examination: Secondary | ICD-10-CM | POA: Diagnosis not present

## 2018-02-15 DIAGNOSIS — Z01811 Encounter for preprocedural respiratory examination: Secondary | ICD-10-CM | POA: Diagnosis not present

## 2018-02-15 DIAGNOSIS — Z01818 Encounter for other preprocedural examination: Secondary | ICD-10-CM | POA: Diagnosis not present

## 2018-02-15 DIAGNOSIS — M5416 Radiculopathy, lumbar region: Secondary | ICD-10-CM | POA: Diagnosis not present

## 2018-02-15 DIAGNOSIS — Z01812 Encounter for preprocedural laboratory examination: Secondary | ICD-10-CM | POA: Diagnosis not present

## 2018-02-15 DIAGNOSIS — E119 Type 2 diabetes mellitus without complications: Secondary | ICD-10-CM | POA: Diagnosis not present

## 2018-02-15 DIAGNOSIS — M48061 Spinal stenosis, lumbar region without neurogenic claudication: Secondary | ICD-10-CM | POA: Diagnosis not present

## 2018-02-18 DIAGNOSIS — E785 Hyperlipidemia, unspecified: Secondary | ICD-10-CM | POA: Diagnosis not present

## 2018-02-18 DIAGNOSIS — Z7984 Long term (current) use of oral hypoglycemic drugs: Secondary | ICD-10-CM | POA: Diagnosis not present

## 2018-02-18 DIAGNOSIS — I2510 Atherosclerotic heart disease of native coronary artery without angina pectoris: Secondary | ICD-10-CM | POA: Diagnosis not present

## 2018-02-18 DIAGNOSIS — I10 Essential (primary) hypertension: Secondary | ICD-10-CM | POA: Diagnosis not present

## 2018-02-18 DIAGNOSIS — M48061 Spinal stenosis, lumbar region without neurogenic claudication: Secondary | ICD-10-CM | POA: Diagnosis not present

## 2018-02-18 DIAGNOSIS — R7302 Impaired glucose tolerance (oral): Secondary | ICD-10-CM | POA: Diagnosis not present

## 2018-02-18 DIAGNOSIS — G4733 Obstructive sleep apnea (adult) (pediatric): Secondary | ICD-10-CM | POA: Diagnosis not present

## 2018-02-18 DIAGNOSIS — Z87891 Personal history of nicotine dependence: Secondary | ICD-10-CM | POA: Diagnosis not present

## 2018-02-18 DIAGNOSIS — E7439 Other disorders of intestinal carbohydrate absorption: Secondary | ICD-10-CM | POA: Diagnosis not present

## 2018-02-18 DIAGNOSIS — Z85118 Personal history of other malignant neoplasm of bronchus and lung: Secondary | ICD-10-CM | POA: Diagnosis not present

## 2018-02-18 DIAGNOSIS — Z853 Personal history of malignant neoplasm of breast: Secondary | ICD-10-CM | POA: Diagnosis not present

## 2018-02-18 DIAGNOSIS — Z96653 Presence of artificial knee joint, bilateral: Secondary | ICD-10-CM | POA: Diagnosis not present

## 2018-02-18 DIAGNOSIS — I872 Venous insufficiency (chronic) (peripheral): Secondary | ICD-10-CM | POA: Diagnosis not present

## 2018-02-18 DIAGNOSIS — M7138 Other bursal cyst, other site: Secondary | ICD-10-CM | POA: Diagnosis not present

## 2018-02-18 DIAGNOSIS — K219 Gastro-esophageal reflux disease without esophagitis: Secondary | ICD-10-CM | POA: Diagnosis not present

## 2018-02-21 ENCOUNTER — Other Ambulatory Visit: Payer: Self-pay

## 2018-02-21 NOTE — Patient Outreach (Signed)
[facility]) Care Management  [place]  02/21/2018  Kathryn Hammond 1940/12/09 [id number]  Reason for referral: 30 day post discharge medication review  Successful telephone call attempt to [name] this morning.  Patient requested that I call her back this afternoon.  Upon second outreach attempt after lunch, she declined [place] medication review.   Joetta Manners, PharmD Clinical Pharmacist [place] [phone]

## 2018-02-21 NOTE — Patient Outreach (Signed)
[facility] [facility]) Care Management  02/21/2018  Kathryn Hammond 05/05/1940 [id number]  Transition of care  Referral date: 02/21/18 Referral source: discharged from an inpatient admission from [facility] on 02/19/18 Insurance: health team advantage  Transition of care will be completed by patient's primary care provider office who will refer to [place] Management if needed.   PLAN: RNCM will close patient due to patient being enrolled in an external program.   Quinn Plowman RN,BSN,CCM [facility] Telephonic  [phone]

## 2018-03-13 ENCOUNTER — Ambulatory Visit
Admission: RE | Admit: 2018-03-13 | Discharge: 2018-03-13 | Disposition: A | Discharge status: Home / Self Care | Payer: PPO | Source: Ambulatory Visit | Attending: General Surgery | Admitting: General Surgery

## 2018-03-13 DIAGNOSIS — M5441 Lumbago with sciatica, right side: Secondary | ICD-10-CM | POA: Diagnosis not present

## 2018-03-13 DIAGNOSIS — R928 Other abnormal and inconclusive findings on diagnostic imaging of breast: Secondary | ICD-10-CM | POA: Diagnosis not present

## 2018-03-13 DIAGNOSIS — G4730 Sleep apnea, unspecified: Secondary | ICD-10-CM | POA: Diagnosis not present

## 2018-03-13 DIAGNOSIS — R920 Mammographic microcalcification found on diagnostic imaging of breast: Secondary | ICD-10-CM | POA: Diagnosis not present

## 2018-03-13 DIAGNOSIS — K219 Gastro-esophageal reflux disease without esophagitis: Secondary | ICD-10-CM | POA: Diagnosis not present

## 2018-03-13 DIAGNOSIS — Z794 Long term (current) use of insulin: Secondary | ICD-10-CM | POA: Diagnosis not present

## 2018-03-13 DIAGNOSIS — Z853 Personal history of malignant neoplasm of breast: Secondary | ICD-10-CM | POA: Diagnosis not present

## 2018-03-13 DIAGNOSIS — I25118 Atherosclerotic heart disease of native coronary artery with other forms of angina pectoris: Secondary | ICD-10-CM | POA: Diagnosis not present

## 2018-03-13 DIAGNOSIS — Z86000 Personal history of in-situ neoplasm of breast: Secondary | ICD-10-CM | POA: Insufficient documentation

## 2018-03-13 DIAGNOSIS — G8929 Other chronic pain: Secondary | ICD-10-CM | POA: Diagnosis not present

## 2018-03-13 DIAGNOSIS — I10 Essential (primary) hypertension: Secondary | ICD-10-CM | POA: Diagnosis not present

## 2018-03-13 DIAGNOSIS — E119 Type 2 diabetes mellitus without complications: Secondary | ICD-10-CM | POA: Diagnosis not present

## 2018-03-19 ENCOUNTER — Ambulatory Visit: Payer: PPO | Admitting: General Surgery

## 2018-03-26 ENCOUNTER — Other Ambulatory Visit: Payer: Self-pay

## 2018-03-26 ENCOUNTER — Ambulatory Visit (INDEPENDENT_AMBULATORY_CARE_PROVIDER_SITE_OTHER): Payer: PPO | Admitting: General Surgery

## 2018-03-26 ENCOUNTER — Encounter: Payer: Self-pay | Admitting: General Surgery

## 2018-03-26 VITALS — BP 108/58 | HR 61 | Resp 18 | Ht 60.0 in | Wt 191.0 lb

## 2018-03-26 DIAGNOSIS — G4733 Obstructive sleep apnea (adult) (pediatric): Secondary | ICD-10-CM | POA: Diagnosis not present

## 2018-03-26 DIAGNOSIS — R0609 Other forms of dyspnea: Secondary | ICD-10-CM | POA: Diagnosis not present

## 2018-03-26 DIAGNOSIS — J432 Centrilobular emphysema: Secondary | ICD-10-CM | POA: Diagnosis not present

## 2018-03-26 DIAGNOSIS — Z6841 Body Mass Index (BMI) 40.0 and over, adult: Secondary | ICD-10-CM | POA: Diagnosis not present

## 2018-03-26 DIAGNOSIS — Z86000 Personal history of in-situ neoplasm of breast: Secondary | ICD-10-CM

## 2018-03-26 NOTE — Progress Notes (Signed)
Patient ID: Kathryn Hammond, female   DOB: April 12, 1940, 77 y.o.   MRN: [id number]  Chief Complaint  Patient presents with  . Follow-up    HPI Kathryn Hammond is a 77 y.o. female.  who presents for her follow up left breat cancer and a breast evaluation. The most recent mammogram was done on 03-13-18.  Patient does perform regular self breast checks and gets regular mammograms done.   She states she can't lay on her left side because it causes pain near her ribs. The pain does occur at random times throughout the day as well. She states her husband passed in May and she had back surgery in November.  HPI  Past Medical History:  Diagnosis Date  . Arthritis   . Asthma   . Breast cancer ([place]) 2010   left breast cancer-DCIS  . CPAP (continuous positive airway pressure) dependence 15 years  . Depression   . Dermatitis   . Diabetes ([place])    non-insulin dependent  . Dyspnea   . Emphysema   . Fatty liver disease, nonalcoholic   . GERD (gastroesophageal reflux disease)   . History of hiatal hernia   . Hyperlipidemia   . Lung cancer ([place])   . Melanoma ([place])    right temple  . Neuromuscular disorder ([facility])    neuropathy  . Obesity, unspecified   . Personal history of malignant neoplasm of breast    left breast DCIS  . Personal history of malignant neoplasm of bronchus and lung    right lung cancer  . Personal history of radiation therapy 2010   f/u left breast cancer   . Personal history of tobacco use, presenting hazards to health   . Reflux esophagitis   . Screening for obesity   . Seasonal allergies   . Sleep apnea    cpap  . Ulcer    peptic  . Unspecified essential hypertension   . Varicose veins of lower extremities with other complications     Past Surgical History:  Procedure Laterality Date  . ABDOMINAL HYSTERECTOMY  1972  . BACK SURGERY  02/2018   [place]  . BREAST BIOPSY Right   . BREAST BIOPSY Left 09/27/2017   Fibroadenomatoid changes associated coarse  calcifications, focal columnar cell change w/ calcs  . BREAST EXCISIONAL BIOPSY Left 2010   DCIS  . BREAST LUMPECTOMY Left 2010   Radiation for DCIS  . BREAST SURGERY Left 2010   mammosite balloon placement and removal  . CATARACT EXTRACTION W/PHACO Right 09/12/2016   Procedure: CATARACT EXTRACTION PHACO AND INTRAOCULAR LENS PLACEMENT (IOC);  Surgeon: Birder Robson, MD;  Location: [facility] ORS;  Service: Ophthalmology;  Laterality: Right;  [place] 00:38.2 AP% 10.9 CDE 4.16 Fluid Pack lot # [id number]  . CATARACT EXTRACTION W/PHACO Left 10/03/2016   Procedure: CATARACT EXTRACTION PHACO AND INTRAOCULAR LENS PLACEMENT (IOC);  Surgeon: Birder Robson, MD;  Location: [facility] ORS;  Service: Ophthalmology;  Laterality: Left;  [place] 00:37 AP% 20.6 CDE 7.69 Fluid pack lot # [id number]  . COLONOSCOPY  2007 and 2012   Dr[facility] Vira Agar at [facility]; polyps  . COLONOSCOPY WITH PROPOFOL N/A 10/28/2015   Procedure: COLONOSCOPY WITH PROPOFOL;  Surgeon: Manya Silvas, MD;  Location: [facility] ENDOSCOPY;  Service: Endoscopy;  Laterality: N/A;  . ESOPHAGOGASTRODUODENOSCOPY (EGD) WITH PROPOFOL N/A 10/28/2015   Procedure: ESOPHAGOGASTRODUODENOSCOPY (EGD) WITH PROPOFOL;  Surgeon: Manya Silvas, MD;  Location: [facility] ENDOSCOPY;  Service: Endoscopy;  Laterality: N/A;  . EYE SURGERY  1992  . HERNIA REPAIR  [place]   knee replaced  . KNEE SURGERY  1977   knees replaced  . LEFT HEART CATH AND CORONARY ANGIOGRAPHY Left 03/05/2017   Procedure: LEFT HEART CATH AND CORONARY ANGIOGRAPHY;  Surgeon: Teodoro Spray, MD;  Location: [place] CV LAB;  Service: Cardiovascular;  Laterality: Left;  . LOBECTOMY  1989  . MELANOMA EXCISION  December 07, 2009   right temple  . MOHS SURGERY  2016   [facility], nose  . SALPINGOOPHORECTOMY  1972  . VEIN SURGERY  September 2011   vein closure procedure left leg; RF ablation of GSV in both legs    Family History  Problem Relation Age of Onset  . Ovarian cancer Mother   . Breast  cancer Neg Hx     Social History Social History   Tobacco Use  . Smoking status: Former Smoker    Years: 30.00    Last attempt to quit: 04/10/1988    Years since quitting: 29.9  . Smokeless tobacco: Never Used  Substance Use Topics  . Alcohol use: Yes    Alcohol/week: 0.0 standard drinks    Comment: once in a while  . Drug use: No    Allergies  Allergen Reactions  . Balsam [place]-Castor [Amberderm] Other (See Comments)    Unknown  According to [facility] - positive patch test   . Carafate [Sucralfate] Itching  . Dmdm Hydantoin Other (See Comments)    Unknown  According to [facility] - positive patch tes  . Lisinopril Swelling  . Peanut-Containing Drug Products Swelling  . Pepcid [Famotidine] Itching  . Silver Other (See Comments)    blisters  . Tape Itching    Redness   . Zantac [Ranitidine Hcl] Hives  . Xanax [Alprazolam] Rash    Current Outpatient Medications  Medication Sig Dispense Refill  . acetaminophen (TYLENOL) 500 MG tablet Take 500-1,000 mg 2 (two) times daily as needed by mouth for mild pain.     Marland Kitchen amLODipine (NORVASC) 10 MG tablet Take 5 mg daily by mouth. May take an additional 5 mg at night as needed for high blood pressure    . ARTIFICIAL TEAR OP Apply 1 drop daily as needed to eye (dry eyes).    Marland Kitchen atorvastatin (LIPITOR) 40 MG tablet Take 40 mg at bedtime by mouth.     . Biotin 5000 MCG CAPS Take 10,000 mcg daily by mouth.     . Cholecalciferol (VITAMIN D3) 2000 units capsule Take 2,000 Units by mouth daily.    . clonazePAM (KLONOPIN) 0.5 MG tablet Take 0.5 mg by mouth daily as needed for anxiety.     . diclofenac (VOLTAREN) 75 MG EC tablet Take 75 mg by mouth 2 (two) times daily.    . DULoxetine (CYMBALTA) 30 MG capsule Take 30 mg by mouth daily.    . fluticasone (FLONASE) 50 MCG/ACT nasal spray Place 2 sprays into both nostrils daily as needed for allergies.     Marland Kitchen gabapentin (NEURONTIN) 300 MG capsule See admin instructions. Take 1 capsule (300MG ) by mouth twice  daily - may take an additional capsule daily if needed    . Glucosamine HCl (GLUCOSAMINE PO) Take 2,000 mg 3 (three) times a week by mouth.     . hydrochlorothiazide (HYDRODIURIL) 12.5 MG tablet Take 12.5 mg at bedtime by mouth.     Marland Kitchen ipratropium (ATROVENT HFA) 17 MCG/ACT inhaler Inhale 2 puffs into the lungs every 6 (six) hours as needed for wheezing.     Marland Kitchen  losartan (COZAAR) 100 MG tablet Take 100 mg at bedtime by mouth.     . Magnesium 250 MG TABS Take 250 mg by mouth daily.    . metFORMIN (GLUCOPHAGE-XR) 750 MG 24 hr tablet Take 750 mg by mouth 2 (two) times daily.     . metoprolol succinate (TOPROL-XL) 100 MG 24 hr tablet Take 50 mg by mouth at bedtime. Take with or immediately following a meal.    . nystatin ointment (MYCOSTATIN) Apply 1 application 3 (three) times daily as needed topically (itching).   2  . omeprazole (PRILOSEC) 40 MG capsule Take 40 mg by mouth daily.    . ondansetron (ZOFRAN) 4 MG tablet Take 1 tablet (4 mg total) by mouth every 8 (eight) hours as needed for nausea or vomiting. 10 tablet 0  . Propylene Glycol-Glycerin (SOOTHE OP) Apply 1 drop daily as needed to eye (dry eyes).    Marland Kitchen tiZANidine (ZANAFLEX) 2 MG tablet Take 2 mg by mouth 3 (three) times daily as needed for muscle spasms.  0  . traMADol (ULTRAM) 50 MG tablet Take 50 mg by mouth every 6 (six) hours as needed for pain.  1  . triamcinolone ointment (KENALOG) 0.1 % Apply 1 application 3 (three) times daily as needed topically (itching).   1  . vitamin B-12 (CYANOCOBALAMIN) 1000 MCG tablet Take 1,000 mcg daily by mouth.     No current facility-administered medications for this visit.     Review of Systems Review of Systems  Constitutional: Negative.   Respiratory: Negative.   Cardiovascular: Negative.     Blood pressure (!) 108/58, pulse 61, resp. rate 18, height 5' (1.524 m), weight 191 lb (86.6 kg), SpO2 95 %.  Physical Exam Physical Exam Exam conducted with a chaperone present.  Constitutional:       Appearance: She is well-developed.  Eyes:     General: No scleral icterus.    Conjunctiva/sclera: Conjunctivae normal.  Neck:     Musculoskeletal: Neck supple.  Cardiovascular:     Rate and Rhythm: Normal rate and regular rhythm.     Heart sounds: Normal heart sounds.  Pulmonary:     Effort: Pulmonary effort is normal.     Breath sounds: Normal breath sounds.  Chest:     Breasts:        Right: No inverted nipple, mass, nipple discharge, skin change or tenderness.        Left: No inverted nipple, mass, nipple discharge, skin change or tenderness.       Comments: Left breast lumpectomy well healed scar Lymphadenopathy:     Cervical: No cervical adenopathy.  Skin:    General: Skin is warm and dry.  Neurological:     Mental Status: She is alert and oriented to person, place, and time.  Psychiatric:        Behavior: Behavior normal.     Data Reviewed September 27, 2017 biopsy: Surgical Pathology  CASE: [id number]  PATIENT: Bonner Puna  Surgical Pathology Report      SPECIMEN SUBMITTED:  A. Breast, left   CLINICAL HISTORY:  Left UOQ of breast with 1.6 x 0.8 x 1.3 cm group of heterogeneous  calcifications; history of DCIS left breast in 2010   PRE-OPERATIVE DIAGNOSIS:  DCIS vs fat necrosis   POST-OPERATIVE DIAGNOSIS:  Same as pre-op      DIAGNOSIS:  A. BREAST, LEFT UPPER OUTER QUADRANT; STEREOTACTIC BIOPSY:  - FIBROADENOMATOID CHANGE WITH ASSOCIATED COARSE CALCIFICATION.  - FOCAL COLUMNAR CELL CHANGE WITH CALCIFICATION.  -  NEGATIVE FOR ATYPIA AND MALIGNANCY.  March 13, 2018 mammograms of the left breast reviewed, BIRAD 3.   Assessment    Stable breast exam.     Plan    The patient has been asked to return to the office in 6 months with a bilateral diagnostic mammogram. The patient is aware to call back for any questions or new concerns.      HPI, Physical Exam, Assessment and Plan have been scribed under the direction and in the presence of  Robert Bellow, MD. Karie Fetch, RN   I have completed the exam and reviewed the above documentation for accuracy and completeness.  I agree with the above.  Haematologist has been used and any errors in dictation or transcription are unintentional.  Hervey Ard, M.D., F.A.C.S.  Forest Gleason Yaiden Yang 03/26/2018, 10:42 AM

## 2018-03-26 NOTE — Patient Instructions (Addendum)
The patient is aware to call back for any questions or new concerns.  The patient has been asked to return to the office in 6 months with a bilateral diagnostic mammogram.

## 2018-05-01 ENCOUNTER — Other Ambulatory Visit: Payer: Self-pay | Admitting: Pharmacist

## 2018-05-01 NOTE — Patient Outreach (Signed)
[facility]) Care Management  [place]   05/01/2018  Kathryn Hammond [birth year] [id number]  Reason for referral: Medication Management - Medication Review  Referral source: [employer]  PMHx includes but not limited to: HTN, carotid stenosis, HLD, hx breast and lung cancer   Subjective:  Patient failed statin, ACEi/ARB, and diabetes adherence measures in 2019. Contacted patient to determine if any pharmacy intervention would benefit medication adherence for 2020. HIPAA verifiers identified. She noted that she has started receiving [facility] Pill Packs that separate her medications into morning and evening administration. She reports being pleased with the service.   She does report that she will be contacting Dr. Lovie Macadamia for a refill on clonazepam. We discussed if she was taking duloxetine that appeared on her medication list; she notes that she has never taken the medication, and is adamant that clonazepam is the only thing she will take for mood.   Objective: Lab Results  Component Value Date   CREATININE 0.91 12/13/2017   CREATININE 0.90 01/25/2017   CREATININE 0.84 11/23/2015  CrCl 50 mL/min  Lab Results  Component Value Date   HGBA1C 6.1 04/07/2013    Lipid Panel     Component Value Date/Time   CHOL 132 04/10/2013 0657   TRIG 104 04/10/2013 0657   HDL 50 04/10/2013 0657   VLDL 21 04/10/2013 0657   LDLCALC 61 04/10/2013 0657    BP Readings from Last 3 Encounters:  03/26/18 (!) 108/58  12/13/17 135/85  09/20/17 132/80    Allergies  Allergen Reactions  . Balsam [place]-Castor [Amberderm] Other (See Comments)    Unknown  According to [facility] - positive patch test   . Carafate [Sucralfate] Itching  . Dmdm Hydantoin Other (See Comments)    Unknown  According to [facility] - positive patch tes  . Lisinopril Swelling  . Peanut-Containing Drug Products Swelling  . Pepcid [Famotidine] Itching  . Silver Other (See Comments)    blisters  .  Tape Itching    Redness   . Zantac [Ranitidine Hcl] Hives  . Xanax [Alprazolam] Rash    Medications Reviewed Today    Reviewed by De Hollingshead, [facility] (Pharmacist) on 05/01/18 at 1321  Med List Status: <None>  Medication Order Taking? Sig Documenting Provider Last Dose Status Informant  acetaminophen (TYLENOL) 500 MG tablet [id number] Yes Take 500-1,000 mg 2 (two) times daily as needed by mouth for mild pain.  [provider] Taking Active Other  amLODipine (NORVASC) 10 MG tablet [id number] Yes Take 10 mg by mouth daily.  [provider] Taking Active Self           Med Note Kelby Aline May 01, 2018  1:03 PM)    ARTIFICIAL TEAR OP [id number] Yes Apply 1 drop daily as needed to eye (dry eyes). [provider] Taking Active Other  atorvastatin (LIPITOR) 40 MG tablet [id number] Yes Take 40 mg at bedtime by mouth.  [provider] Taking Active Self           Med Note Judeen Hammans Nov 23, 2015  4:43 AM)    Biotin 5000 MCG CAPS [id number] Yes Take 10,000 mcg daily by mouth.  [provider] Taking Active Other  Cholecalciferol (VITAMIN D3) 2000 units capsule [id number] Yes Take 2,000 Units by mouth daily. [provider] Taking Active Other  clonazePAM (KLONOPIN) 0.5 MG tablet [id number] Yes Take 0.5 mg by mouth daily as needed for anxiety.  [provider] Taking Active Self           Med Note Darnelle Maffucci, Arville Lime   Wed May 01, 2018  1:02 PM) Using a couple times a week  diclofenac (VOLTAREN) 75 MG EC tablet [id number] Yes Take 75 mg by mouth 2 (two) times daily. [provider] Taking Active Self        Discontinued 05/01/18 1321 (Patient Preference)   fluticasone (FLONASE) 50 MCG/ACT nasal spray [id number] No Place 2 sprays into both nostrils daily as needed for allergies.  [provider] Not Taking Active Other  gabapentin (NEURONTIN) 300 MG capsule [id number] Yes See admin instructions.  Take 1 capsule (300MG ) by mouth twice daily - may take an additional capsule daily if needed [provider] Taking Active Self  hydrochlorothiazide (HYDRODIURIL) 12.5 MG tablet [id number] Yes Take 12.5 mg at bedtime by mouth.  [provider] Taking Active Self           Med Note Darnelle Maffucci, Arville Lime   Wed May 01, 2018  1:06 PM) AM  ipratropium (ATROVENT HFA) 17 MCG/ACT inhaler [id number] Yes Inhale 2 puffs into the lungs every 6 (six) hours as needed for wheezing.  [provider] Taking Active Other           Med Note ([place]   Wed May 01, 2018  1:16 PM) Using as needed   losartan (COZAAR) 100 MG tablet [id number] Yes Take 100 mg at bedtime by mouth.  [provider] Taking Active Self           Med Note Judeen Hammans Nov 23, 2015  4:43 AM)    Magnesium 250 MG TABS [id number] Yes Take 250 mg by mouth daily. [provider] Taking Active Other  metFORMIN (GLUCOPHAGE-XR) 750 MG 24 hr tablet [id number] Yes Take 750 mg by mouth 2 (two) times daily.  [provider] Taking Active Self  metoprolol succinate (TOPROL-XL) 100 MG 24 hr tablet [id number] Yes Take 50 mg by mouth at bedtime. Take with or immediately following a meal. [provider] Taking Active Self           Med Note Darnelle Maffucci, Lakeyta Vandenheuvel E   Wed May 01, 2018  1:05 PM) AM  nystatin ointment (MYCOSTATIN) [id number] No Apply 1 application 3 (three) times daily as needed topically (itching).  [provider] Not Taking Active Other  omeprazole (PRILOSEC) 40 MG capsule [id number] Yes Take 40 mg by mouth daily. [provider] Taking Active Self  Propylene Glycol-Glycerin (SOOTHE OP) [id number] Yes Apply 1 drop daily as needed to eye (dry eyes). [provider] Taking Active Other  traMADol (ULTRAM) 50 MG tablet [id number] No Take 50 mg by mouth every 6 (six) hours as needed for pain. [provider] Not Taking Active Other   triamcinolone ointment (KENALOG) 0.1 % [id number] Yes Apply 1 application 3 (three) times daily as needed topically (itching).  [provider] Taking Active Other  TURMERIC PO [id number] Yes Take 1 tablet by mouth. [provider] Taking Active   vitamin B-12 (CYANOCOBALAMIN) 1000 MCG tablet [id number] Yes Take 1,000 mcg daily by mouth. [provider] Taking Active Other  vitamin E 100 UNIT capsule [id number] Yes Take 100 Units by mouth daily. [provider] Taking Active           Assessment:  Drugs sorted by system:  Neurologic/Psychologic: - Clonazepam   Cardiovascular: - Amlodipine - Losartan  - HCTZ  Pulmonary/Allergy: - Atrovent (ipratropium) PRN  - Albuterol HFA PRn  Gastrointestinal: - Omeprazole   Pain: - Acetaminophen - Diclofenac PRN - Gabapentin  Vitamins/Minerals/Supplements: - Biotin - Vitamin D - Magnesium - Tumeric  - Vitamin B12 - Vitamin E  Medication Review Findings:  . Anxiety/depression: patient was very open about many recent family illnesses/deaths and how this has impacted her mood. We discussed the benefit of duloxetine (was on her profile); she was adamant that she preferred treatment with clonazepam . Adherence: patient confirmed that she is taking atorvastatin daily, losartan daily, and metformin twice daily as per her prescriptions  Plan: - Ensured that patient had my contact information, should need any future help managing her medications and refills. - Will route note to PCP for notification  Catie Darnelle Maffucci, PharmD PGY2 Ambulatory Care Pharmacy Resident, [place] Phone: [phone]

## 2018-06-03 DIAGNOSIS — D225 Melanocytic nevi of trunk: Secondary | ICD-10-CM | POA: Diagnosis not present

## 2018-06-03 DIAGNOSIS — D2261 Melanocytic nevi of right upper limb, including shoulder: Secondary | ICD-10-CM | POA: Diagnosis not present

## 2018-06-03 DIAGNOSIS — C4441 Basal cell carcinoma of skin of scalp and neck: Secondary | ICD-10-CM | POA: Diagnosis not present

## 2018-06-03 DIAGNOSIS — L821 Other seborrheic keratosis: Secondary | ICD-10-CM | POA: Diagnosis not present

## 2018-06-03 DIAGNOSIS — D2271 Melanocytic nevi of right lower limb, including hip: Secondary | ICD-10-CM | POA: Diagnosis not present

## 2018-06-03 DIAGNOSIS — H9211 Otorrhea, right ear: Secondary | ICD-10-CM | POA: Diagnosis not present

## 2018-06-03 DIAGNOSIS — Z08 Encounter for follow-up examination after completed treatment for malignant neoplasm: Secondary | ICD-10-CM | POA: Diagnosis not present

## 2018-06-03 DIAGNOSIS — D2272 Melanocytic nevi of left lower limb, including hip: Secondary | ICD-10-CM | POA: Diagnosis not present

## 2018-06-03 DIAGNOSIS — Z85828 Personal history of other malignant neoplasm of skin: Secondary | ICD-10-CM | POA: Diagnosis not present

## 2018-06-03 DIAGNOSIS — R3915 Urgency of urination: Secondary | ICD-10-CM | POA: Diagnosis not present

## 2018-06-03 DIAGNOSIS — D2262 Melanocytic nevi of left upper limb, including shoulder: Secondary | ICD-10-CM | POA: Diagnosis not present

## 2018-06-03 DIAGNOSIS — Z85820 Personal history of malignant melanoma of skin: Secondary | ICD-10-CM | POA: Diagnosis not present

## 2018-06-18 DIAGNOSIS — C44212 Basal cell carcinoma of skin of right ear and external auricular canal: Secondary | ICD-10-CM | POA: Diagnosis not present

## 2018-08-14 DIAGNOSIS — S70362A Insect bite (nonvenomous), left thigh, initial encounter: Secondary | ICD-10-CM | POA: Diagnosis not present

## 2018-08-14 DIAGNOSIS — L299 Pruritus, unspecified: Secondary | ICD-10-CM | POA: Diagnosis not present

## 2018-08-14 DIAGNOSIS — Z139 Encounter for screening, unspecified: Secondary | ICD-10-CM | POA: Diagnosis not present

## 2018-08-19 ENCOUNTER — Encounter (INDEPENDENT_AMBULATORY_CARE_PROVIDER_SITE_OTHER): Payer: Self-pay | Admitting: Nurse Practitioner

## 2018-08-19 ENCOUNTER — Other Ambulatory Visit: Payer: Self-pay

## 2018-08-19 ENCOUNTER — Ambulatory Visit (INDEPENDENT_AMBULATORY_CARE_PROVIDER_SITE_OTHER): Payer: PPO | Admitting: Nurse Practitioner

## 2018-08-19 ENCOUNTER — Ambulatory Visit (INDEPENDENT_AMBULATORY_CARE_PROVIDER_SITE_OTHER): Payer: PPO

## 2018-08-19 VITALS — BP 128/81 | HR 65 | Resp 10 | Ht 60.0 in | Wt 198.0 lb

## 2018-08-19 DIAGNOSIS — Z7982 Long term (current) use of aspirin: Secondary | ICD-10-CM

## 2018-08-19 DIAGNOSIS — I6523 Occlusion and stenosis of bilateral carotid arteries: Secondary | ICD-10-CM

## 2018-08-19 DIAGNOSIS — E785 Hyperlipidemia, unspecified: Secondary | ICD-10-CM | POA: Diagnosis not present

## 2018-08-19 DIAGNOSIS — Z79899 Other long term (current) drug therapy: Secondary | ICD-10-CM | POA: Diagnosis not present

## 2018-08-19 DIAGNOSIS — Z87891 Personal history of nicotine dependence: Secondary | ICD-10-CM | POA: Diagnosis not present

## 2018-08-19 DIAGNOSIS — I10 Essential (primary) hypertension: Secondary | ICD-10-CM

## 2018-08-19 DIAGNOSIS — I8312 Varicose veins of left lower extremity with inflammation: Secondary | ICD-10-CM

## 2018-08-19 DIAGNOSIS — I8311 Varicose veins of right lower extremity with inflammation: Secondary | ICD-10-CM

## 2018-08-19 NOTE — Progress Notes (Signed)
SUBJECTIVE:  Patient ID: Kathryn Hammond, female    DOB: 1941-02-19, 78 y.o.   MRN: [id number] Chief Complaint  Patient presents with  . Follow-up    HPI  Kathryn Hammond is a 78 y.o. female The patient is seen for follow up evaluation of carotid stenosis. The carotid stenosis followed by ultrasound.   The patient denies amaurosis fugax. There is no recent history of TIA symptoms or focal motor deficits. There is no prior documented CVA.  The patient is taking enteric-coated aspirin 81 mg daily.  There is no history of migraine headaches. There is no history of seizures.  The patient has a history of coronary artery disease, no recent episodes of angina or shortness of breath. The patient denies PAD or claudication symptoms. There is a history of hyperlipidemia which is being treated with a statin.    Carotid Duplex done today shows 1 to 39% stenosis bilaterally.  Study done on 08/16/2017 showed 40 to 59% stenosis of the left internal carotid artery. Past Medical History:  Diagnosis Date  . Arthritis   . Asthma   . Breast cancer ([place]) 2010   left breast cancer-DCIS  . CPAP (continuous positive airway pressure) dependence 15 years  . Depression   . Dermatitis   . Diabetes ([place])    non-insulin dependent  . Dyspnea   . Emphysema   . Fatty liver disease, nonalcoholic   . GERD (gastroesophageal reflux disease)   . History of hiatal hernia   . Hyperlipidemia   . Lung cancer ([place])   . Melanoma ([place])    right temple  . Neuromuscular disorder ([facility])    neuropathy  . Obesity, unspecified   . Personal history of malignant neoplasm of breast    left breast DCIS  . Personal history of malignant neoplasm of bronchus and lung    right lung cancer  . Personal history of radiation therapy 2010   f/u left breast cancer   . Personal history of tobacco use, presenting hazards to health   . Reflux esophagitis   . Screening for obesity   . Seasonal allergies   . Sleep apnea    cpap  . Ulcer    peptic  . Unspecified essential hypertension   . Varicose veins of lower extremities with other complications     Past Surgical History:  Procedure Laterality Date  . ABDOMINAL HYSTERECTOMY  1972  . BACK SURGERY  02/2018   [place]  . BREAST BIOPSY Right   . BREAST BIOPSY Left 09/27/2017   Fibroadenomatoid changes associated coarse calcifications, focal columnar cell change w/ calcs  . BREAST EXCISIONAL BIOPSY Left 2010   DCIS  . BREAST LUMPECTOMY Left 2010   Radiation for DCIS  . BREAST SURGERY Left 2010   mammosite balloon placement and removal  . CATARACT EXTRACTION W/PHACO Right 09/12/2016   Procedure: CATARACT EXTRACTION PHACO AND INTRAOCULAR LENS PLACEMENT (IOC);  Surgeon: Birder Robson, MD;  Location: [facility] ORS;  Service: Ophthalmology;  Laterality: Right;  [place] 00:38.2 AP% 10.9 CDE 4.16 Fluid Pack lot # [id number]  . CATARACT EXTRACTION W/PHACO Left 10/03/2016   Procedure: CATARACT EXTRACTION PHACO AND INTRAOCULAR LENS PLACEMENT (IOC);  Surgeon: Birder Robson, MD;  Location: [facility] ORS;  Service: Ophthalmology;  Laterality: Left;  [place] 00:37 AP% 20.6 CDE 7.69 Fluid pack lot # [id number]  . COLONOSCOPY  2007 and 2012   Dr. Vira Agar at [facility]; polyps  . COLONOSCOPY WITH PROPOFOL N/A 10/28/2015   Procedure: COLONOSCOPY WITH PROPOFOL;  Surgeon:  Manya Silvas, MD;  Location: [facility] ENDOSCOPY;  Service: Endoscopy;  Laterality: N/A;  . ESOPHAGOGASTRODUODENOSCOPY (EGD) WITH PROPOFOL N/A 10/28/2015   Procedure: ESOPHAGOGASTRODUODENOSCOPY (EGD) WITH PROPOFOL;  Surgeon: Manya Silvas, MD;  Location: [facility] ENDOSCOPY;  Service: Endoscopy;  Laterality: N/A;  . EYE SURGERY  1992  . HERNIA REPAIR  1990  . KNEE SURGERY  1999   knee replaced  . KNEE SURGERY  1977   knees replaced  . LEFT HEART CATH AND CORONARY ANGIOGRAPHY Left 03/05/2017   Procedure: LEFT HEART CATH AND CORONARY ANGIOGRAPHY;  Surgeon: Teodoro Spray, MD;  Location: [facility];  Service:  Cardiovascular;  Laterality: Left;  . LOBECTOMY  1989  . MELANOMA EXCISION  December 07, 2009   right temple  . MOHS SURGERY  2016   [facility], nose  . SALPINGOOPHORECTOMY  1972  . VEIN SURGERY  September 2011   vein closure procedure left leg; RF ablation of GSV in both legs    Social History   Socioeconomic History  . Marital status: Widowed    Spouse name: Not on file  . Number of children: Not on file  . Years of education: Not on file  . Highest education level: Not on file  Occupational History  . Not on file  Social Needs  . Financial resource strain: Not on file  . Food insecurity:    Worry: Not on file    Inability: Not on file  . Transportation needs:    Medical: Not on file    Non-medical: Not on file  Tobacco Use  . Smoking status: Former Smoker    Years: 30.00    Last attempt to quit: 04/10/1988    Years since quitting: 30.3  . Smokeless tobacco: Never Used  Substance and Sexual Activity  . Alcohol use: Yes    Alcohol/week: 0.0 standard drinks    Comment: once in a while  . Drug use: No  . Sexual activity: Not on file  Lifestyle  . Physical activity:    Days per week: Not on file    Minutes per session: Not on file  . Stress: Not on file  Relationships  . Social connections:    Talks on phone: Not on file    Gets together: Not on file    Attends religious service: Not on file    Active member of club or organization: Not on file    Attends meetings of clubs or organizations: Not on file    Relationship status: Not on file  . Intimate partner violence:    Fear of current or ex partner: Not on file    Emotionally abused: Not on file    Physically abused: Not on file    Forced sexual activity: Not on file  Other Topics Concern  . Not on file  Social History Narrative  . Not on file    Family History  Problem Relation Age of Onset  . Ovarian cancer Mother   . Breast cancer Neg Hx     Allergies  Allergen Reactions  . Balsam [place]-Castor  [Amberderm] Other (See Comments)    Unknown  According to [facility] - positive patch test   . Carafate [Sucralfate] Itching  . Dmdm Hydantoin Other (See Comments)    Unknown  According to [facility] - positive patch tes  . Lisinopril Swelling  . Peanut-Containing Drug Products Swelling  . Pepcid [Famotidine] Itching  . Silver Other (See Comments)    blisters  . Tape  Itching    Redness   . Zantac [Ranitidine Hcl] Hives  . Xanax [Alprazolam] Rash     Review of Systems   Review of Systems: Negative Unless Checked Constitutional: [] Weight loss  [] Fever  [] Chills Cardiac: [] Chest pain   []  Atrial Fibrillation  [] Palpitations   [] Shortness of breath when laying flat   [] Shortness of breath with exertion. [] Shortness of breath at rest Vascular:  [] Pain in legs with walking   [] Pain in legs with standing [] Pain in legs when laying flat   [] Claudication    [] Pain in feet when laying flat    [] History of DVT   [] Phlebitis   [x] Swelling in legs   [x] Varicose veins   [] Non-healing ulcers Pulmonary:   [] Uses home oxygen   [] Productive cough   [] Hemoptysis   [] Wheeze  [] COPD   [] Asthma Neurologic:  [] Dizziness   [] Seizures  [] Blackouts [] History of stroke   [] History of TIA  [] Aphasia   [] Temporary Blindness   [] Weakness or numbness in arm   [] Weakness or numbness in leg Musculoskeletal:   [] Joint swelling   [] Joint pain   [] Low back pain  []  History of Knee Replacement [x] Arthritis [x] back Surgeries  []  Spinal Stenosis    Hematologic:  [] Easy bruising  [] Easy bleeding   [] Hypercoagulable state   [] Anemic Gastrointestinal:  [] Diarrhea   [] Vomiting  [x] Gastroesophageal reflux/heartburn   [] Difficulty swallowing. [] Abdominal pain Genitourinary:  [] Chronic kidney disease   [] Difficult urination  [] Anuric   [] Blood in urine [] Frequent urination  [] Burning with urination   [] Hematuria Skin:  [] Rashes   [] Ulcers [] Wounds Psychological:  [] History of anxiety   []  History of major depression  []  Memory Difficulties       OBJECTIVE:   Physical Exam  BP 128/81 (BP Location: Left Arm, Patient Position: Sitting, Cuff Size: Large)   Pulse 65   Resp 10   Ht 5' (1.524 m)   Wt 198 lb (89.8 kg)   BMI 38.67 kg/m   Gen: WD/WN, NAD Head: /AT, No temporalis wasting.  Ear/Nose/Throat: Hearing grossly intact, nares w/o erythema or drainage Eyes: PER, EOMI, sclera nonicteric.  Neck: Supple, no masses.  No JVD.  Pulmonary:  Good air movement, no use of accessory muscles.  Cardiac: RRR Vascular: no carotid bruits  Vessel Right Left  Radial Palpable Palpable  Dorsalis Pedis Palpable Palpable  Posterior Tibial Palpable Palpable   Gastrointestinal: soft, non-distended. No guarding/no peritoneal signs.  Musculoskeletal: M/S 5/5 throughout.  No deformity or atrophy.  Neurologic: Pain and light touch intact in extremities.  Symmetrical.  Speech is fluent. Motor exam as listed above. Psychiatric: Judgment intact, Mood & affect appropriate for pt's clinical situation. Dermatologic: No Venous rashes. No Ulcers Noted.  No changes consistent with cellulitis. Lymph : No Cervical lymphadenopathy, no lichenification or skin changes of chronic lymphedema.       ASSESSMENT AND PLAN:  1. Bilateral carotid artery stenosis Recommend:  Given the patient's asymptomatic subcritical stenosis no further invasive testing or surgery at this time.  Duplex ultrasound shows 1-39% stenosis bilaterally.  Continue antiplatelet therapy as prescribed Continue management of CAD, HTN and Hyperlipidemia Healthy heart diet,  encouraged exercise at least 4 times per week Follow up in 12 months with duplex ultrasound and physical exam  - VAS US CAROTID; Future  2. Essential hypertension Continue antihypertensive medications as already ordered, these medications have been reviewed and there are no changes at this time.   3. Varicose veins of both lower extremities with inflammation No surgery or  intervention at this point in  time.    I have had a long discussion with the patient regarding venous insufficiency and why it  causes symptoms. I have discussed with the patient the chronic skin changes that accompany venous insufficiency and the long term sequela such as infection and ulceration.  Patient will begin wearing graduated compression stockings class 1 (20-30 mmHg) or compression wraps on a daily basis a prescription was given. The patient will put the stockings on first thing in the morning and removing them in the evening. The patient is instructed specifically not to sleep in the stockings.    In addition, behavioral modification including several periods of elevation of the lower extremities during the day will be continued. I have demonstrated that proper elevation is a position with the ankles at heart level.  The patient is instructed to begin routine exercise, especially walking on a daily basis  Patient should undergo duplex ultrasound of the venous system to ensure that DVT or reflux is not present.  Following the review of the ultrasound the patient will follow up in 2-3 months to reassess the degree of swelling and the control that graduated compression stockings or compression wraps  is offering.   The patient can be assessed for a Lymph Pump at that time  4. Hyperlipidemia, unspecified hyperlipidemia type Continue statin as ordered and reviewed, no changes at this time    Current Outpatient Medications on File Prior to Visit  Medication Sig Dispense Refill  . acetaminophen (TYLENOL) 500 MG tablet Take 500-1,000 mg 2 (two) times daily as needed by mouth for mild pain.     Marland Kitchen amLODipine (NORVASC) 10 MG tablet Take 10 mg by mouth daily.     . ARTIFICIAL TEAR OP Apply 1 drop daily as needed to eye (dry eyes).    Marland Kitchen atorvastatin (LIPITOR) 40 MG tablet Take 40 mg at bedtime by mouth.     . Biotin 5000 MCG CAPS Take 10,000 mcg daily by mouth.     . Cholecalciferol (VITAMIN D3) 2000 units capsule Take  2,000 Units by mouth daily.    . clonazePAM (KLONOPIN) 0.5 MG tablet Take 0.5 mg by mouth daily as needed for anxiety.     . diclofenac (VOLTAREN) 75 MG EC tablet Take 75 mg by mouth 2 (two) times daily.    . fluticasone (FLONASE) 50 MCG/ACT nasal spray Place 2 sprays into both nostrils daily as needed for allergies.     Marland Kitchen gabapentin (NEURONTIN) 300 MG capsule See admin instructions. Take 1 capsule (300MG ) by mouth twice daily - may take an additional capsule daily if needed    . hydrochlorothiazide (HYDRODIURIL) 12.5 MG tablet Take 12.5 mg at bedtime by mouth.     Marland Kitchen ipratropium (ATROVENT HFA) 17 MCG/ACT inhaler Inhale 2 puffs into the lungs every 6 (six) hours as needed for wheezing.     Marland Kitchen losartan (COZAAR) 100 MG tablet Take 100 mg at bedtime by mouth.     . Magnesium 250 MG TABS Take 250 mg by mouth daily.    . metFORMIN (GLUCOPHAGE-XR) 750 MG 24 hr tablet Take 750 mg by mouth 2 (two) times daily.     . metoprolol succinate (TOPROL-XL) 100 MG 24 hr tablet Take 50 mg by mouth at bedtime. Take with or immediately following a meal.    . nystatin ointment (MYCOSTATIN) Apply 1 application 3 (three) times daily as needed topically (itching).   2  . omeprazole (PRILOSEC) 40 MG capsule Take 40  mg by mouth daily.    Marland Kitchen Propylene Glycol-Glycerin (SOOTHE OP) Apply 1 drop daily as needed to eye (dry eyes).    . traMADol (ULTRAM) 50 MG tablet Take 50 mg by mouth every 6 (six) hours as needed for pain.  1  . triamcinolone ointment (KENALOG) 0.1 % Apply 1 application 3 (three) times daily as needed topically (itching).   1  . TURMERIC PO Take 1 tablet by mouth.    . vitamin B-12 (CYANOCOBALAMIN) 1000 MCG tablet Take 1,000 mcg daily by mouth.    . vitamin E 100 UNIT capsule Take 100 Units by mouth daily.     No current facility-administered medications on file prior to visit.     There are no Patient Instructions on file for this visit. Return in about 1 year (around 08/19/2019) for carotid stenosis.    Kris Hartmann, NP  This note was completed with Sales executive.  Any errors are purely unintentional.

## 2018-09-09 DIAGNOSIS — L259 Unspecified contact dermatitis, unspecified cause: Secondary | ICD-10-CM | POA: Diagnosis not present

## 2018-09-17 DIAGNOSIS — C44519 Basal cell carcinoma of skin of other part of trunk: Secondary | ICD-10-CM | POA: Diagnosis not present

## 2018-09-17 DIAGNOSIS — D485 Neoplasm of uncertain behavior of skin: Secondary | ICD-10-CM | POA: Diagnosis not present

## 2018-09-17 DIAGNOSIS — Z08 Encounter for follow-up examination after completed treatment for malignant neoplasm: Secondary | ICD-10-CM | POA: Diagnosis not present

## 2018-09-17 DIAGNOSIS — D235 Other benign neoplasm of skin of trunk: Secondary | ICD-10-CM | POA: Diagnosis not present

## 2018-09-17 DIAGNOSIS — Z85828 Personal history of other malignant neoplasm of skin: Secondary | ICD-10-CM | POA: Diagnosis not present

## 2018-09-17 DIAGNOSIS — L237 Allergic contact dermatitis due to plants, except food: Secondary | ICD-10-CM | POA: Diagnosis not present

## 2018-09-17 DIAGNOSIS — Z85820 Personal history of malignant melanoma of skin: Secondary | ICD-10-CM | POA: Diagnosis not present

## 2018-09-19 DIAGNOSIS — M48061 Spinal stenosis, lumbar region without neurogenic claudication: Secondary | ICD-10-CM | POA: Diagnosis not present

## 2018-09-20 DIAGNOSIS — E782 Mixed hyperlipidemia: Secondary | ICD-10-CM | POA: Diagnosis not present

## 2018-09-20 DIAGNOSIS — I10 Essential (primary) hypertension: Secondary | ICD-10-CM | POA: Diagnosis not present

## 2018-09-20 DIAGNOSIS — K219 Gastro-esophageal reflux disease without esophagitis: Secondary | ICD-10-CM | POA: Diagnosis not present

## 2018-09-20 DIAGNOSIS — E119 Type 2 diabetes mellitus without complications: Secondary | ICD-10-CM | POA: Diagnosis not present

## 2018-09-20 DIAGNOSIS — I25118 Atherosclerotic heart disease of native coronary artery with other forms of angina pectoris: Secondary | ICD-10-CM | POA: Diagnosis not present

## 2018-09-20 DIAGNOSIS — Z794 Long term (current) use of insulin: Secondary | ICD-10-CM | POA: Diagnosis not present

## 2018-09-20 DIAGNOSIS — Z6841 Body Mass Index (BMI) 40.0 and over, adult: Secondary | ICD-10-CM | POA: Diagnosis not present

## 2018-09-20 DIAGNOSIS — G4730 Sleep apnea, unspecified: Secondary | ICD-10-CM | POA: Diagnosis not present

## 2018-09-25 DIAGNOSIS — G8929 Other chronic pain: Secondary | ICD-10-CM | POA: Diagnosis not present

## 2018-09-25 DIAGNOSIS — Z794 Long term (current) use of insulin: Secondary | ICD-10-CM | POA: Diagnosis not present

## 2018-09-25 DIAGNOSIS — E782 Mixed hyperlipidemia: Secondary | ICD-10-CM | POA: Diagnosis not present

## 2018-09-25 DIAGNOSIS — Z6841 Body Mass Index (BMI) 40.0 and over, adult: Secondary | ICD-10-CM | POA: Diagnosis not present

## 2018-09-25 DIAGNOSIS — M5441 Lumbago with sciatica, right side: Secondary | ICD-10-CM | POA: Diagnosis not present

## 2018-09-25 DIAGNOSIS — Z0000 Encounter for general adult medical examination without abnormal findings: Secondary | ICD-10-CM | POA: Diagnosis not present

## 2018-09-25 DIAGNOSIS — I25118 Atherosclerotic heart disease of native coronary artery with other forms of angina pectoris: Secondary | ICD-10-CM | POA: Diagnosis not present

## 2018-09-25 DIAGNOSIS — M810 Age-related osteoporosis without current pathological fracture: Secondary | ICD-10-CM | POA: Diagnosis not present

## 2018-09-25 DIAGNOSIS — E119 Type 2 diabetes mellitus without complications: Secondary | ICD-10-CM | POA: Diagnosis not present

## 2018-09-25 DIAGNOSIS — I10 Essential (primary) hypertension: Secondary | ICD-10-CM | POA: Diagnosis not present

## 2018-09-27 ENCOUNTER — Other Ambulatory Visit: Payer: Self-pay

## 2018-09-27 DIAGNOSIS — Z86000 Personal history of in-situ neoplasm of breast: Secondary | ICD-10-CM

## 2018-10-10 ENCOUNTER — Ambulatory Visit
Admission: RE | Admit: 2018-10-10 | Discharge: 2018-10-10 | Disposition: A | Discharge status: Home / Self Care | Payer: PPO | Source: Ambulatory Visit | Attending: General Surgery | Admitting: General Surgery

## 2018-10-10 ENCOUNTER — Other Ambulatory Visit: Payer: Self-pay

## 2018-10-10 DIAGNOSIS — R928 Other abnormal and inconclusive findings on diagnostic imaging of breast: Secondary | ICD-10-CM | POA: Diagnosis not present

## 2018-10-10 DIAGNOSIS — Z86000 Personal history of in-situ neoplasm of breast: Secondary | ICD-10-CM | POA: Diagnosis not present

## 2018-10-16 DIAGNOSIS — C44519 Basal cell carcinoma of skin of other part of trunk: Secondary | ICD-10-CM | POA: Diagnosis not present

## 2018-10-22 ENCOUNTER — Ambulatory Visit: Payer: PPO | Admitting: General Surgery

## 2018-10-22 DIAGNOSIS — J310 Chronic rhinitis: Secondary | ICD-10-CM | POA: Diagnosis not present

## 2018-10-22 DIAGNOSIS — J4520 Mild intermittent asthma, uncomplicated: Secondary | ICD-10-CM | POA: Diagnosis not present

## 2018-10-22 DIAGNOSIS — G4733 Obstructive sleep apnea (adult) (pediatric): Secondary | ICD-10-CM | POA: Diagnosis not present

## 2018-11-11 DIAGNOSIS — E113393 Type 2 diabetes mellitus with moderate nonproliferative diabetic retinopathy without macular edema, bilateral: Secondary | ICD-10-CM | POA: Diagnosis not present

## 2018-11-13 ENCOUNTER — Telehealth: Payer: Self-pay | Admitting: General Practice

## 2018-11-13 NOTE — Telephone Encounter (Signed)
Patient said she needs to come back in a year will you change the recall for the patient to follow up in a year.

## 2018-11-14 ENCOUNTER — Ambulatory Visit: Payer: PPO | Admitting: General Surgery

## 2018-12-12 DIAGNOSIS — M48061 Spinal stenosis, lumbar region without neurogenic claudication: Secondary | ICD-10-CM | POA: Diagnosis not present

## 2018-12-19 DIAGNOSIS — R609 Edema, unspecified: Secondary | ICD-10-CM | POA: Diagnosis not present

## 2019-01-09 ENCOUNTER — Encounter: Payer: Self-pay | Admitting: *Deleted

## 2019-01-16 DIAGNOSIS — M7062 Trochanteric bursitis, left hip: Secondary | ICD-10-CM | POA: Diagnosis not present

## 2019-02-10 DIAGNOSIS — M5416 Radiculopathy, lumbar region: Secondary | ICD-10-CM | POA: Diagnosis not present

## 2019-02-13 DIAGNOSIS — M5416 Radiculopathy, lumbar region: Secondary | ICD-10-CM | POA: Diagnosis not present

## 2019-02-27 DIAGNOSIS — M5416 Radiculopathy, lumbar region: Secondary | ICD-10-CM | POA: Diagnosis not present

## 2019-03-02 ENCOUNTER — Emergency Department: Payer: PPO

## 2019-03-02 ENCOUNTER — Emergency Department
Admission: EM | Admit: 2019-03-02 | Discharge: 2019-03-02 | Disposition: A | Discharge status: Home / Self Care | Payer: PPO | Attending: Emergency Medicine | Admitting: Emergency Medicine

## 2019-03-02 ENCOUNTER — Other Ambulatory Visit: Payer: Self-pay

## 2019-03-02 DIAGNOSIS — Z91010 Allergy to peanuts: Secondary | ICD-10-CM | POA: Insufficient documentation

## 2019-03-02 DIAGNOSIS — S0990XA Unspecified injury of head, initial encounter: Secondary | ICD-10-CM | POA: Diagnosis not present

## 2019-03-02 DIAGNOSIS — Z7984 Long term (current) use of oral hypoglycemic drugs: Secondary | ICD-10-CM | POA: Insufficient documentation

## 2019-03-02 DIAGNOSIS — W01198A Fall on same level from slipping, tripping and stumbling with subsequent striking against other object, initial encounter: Secondary | ICD-10-CM | POA: Insufficient documentation

## 2019-03-02 DIAGNOSIS — R270 Ataxia, unspecified: Secondary | ICD-10-CM | POA: Diagnosis not present

## 2019-03-02 DIAGNOSIS — I10 Essential (primary) hypertension: Secondary | ICD-10-CM | POA: Diagnosis not present

## 2019-03-02 DIAGNOSIS — Z87891 Personal history of nicotine dependence: Secondary | ICD-10-CM | POA: Insufficient documentation

## 2019-03-02 DIAGNOSIS — E119 Type 2 diabetes mellitus without complications: Secondary | ICD-10-CM | POA: Diagnosis not present

## 2019-03-02 DIAGNOSIS — S300XXA Contusion of lower back and pelvis, initial encounter: Secondary | ICD-10-CM | POA: Diagnosis not present

## 2019-03-02 DIAGNOSIS — Y999 Unspecified external cause status: Secondary | ICD-10-CM | POA: Insufficient documentation

## 2019-03-02 DIAGNOSIS — Z853 Personal history of malignant neoplasm of breast: Secondary | ICD-10-CM | POA: Insufficient documentation

## 2019-03-02 DIAGNOSIS — M25551 Pain in right hip: Secondary | ICD-10-CM | POA: Diagnosis not present

## 2019-03-02 DIAGNOSIS — Y929 Unspecified place or not applicable: Secondary | ICD-10-CM | POA: Insufficient documentation

## 2019-03-02 DIAGNOSIS — S0003XA Contusion of scalp, initial encounter: Secondary | ICD-10-CM | POA: Diagnosis not present

## 2019-03-02 DIAGNOSIS — M533 Sacrococcygeal disorders, not elsewhere classified: Secondary | ICD-10-CM | POA: Diagnosis not present

## 2019-03-02 DIAGNOSIS — Z79899 Other long term (current) drug therapy: Secondary | ICD-10-CM | POA: Diagnosis not present

## 2019-03-02 DIAGNOSIS — W19XXXA Unspecified fall, initial encounter: Secondary | ICD-10-CM

## 2019-03-02 DIAGNOSIS — S79911A Unspecified injury of right hip, initial encounter: Secondary | ICD-10-CM | POA: Diagnosis not present

## 2019-03-02 DIAGNOSIS — Y9389 Activity, other specified: Secondary | ICD-10-CM | POA: Insufficient documentation

## 2019-03-02 DIAGNOSIS — S3992XA Unspecified injury of lower back, initial encounter: Secondary | ICD-10-CM | POA: Diagnosis not present

## 2019-03-02 MED ORDER — ACETAMINOPHEN 500 MG PO TABS
1000.0000 mg | ORAL_TABLET | Freq: Once | ORAL | Status: AC
  Administered 2019-03-02: 1000 mg via ORAL
  Filled 2019-03-02: qty 2

## 2019-03-02 NOTE — Discharge Instructions (Signed)
Please take Tylenol every 6 hours as needed for pain.  Follow-up with your primary care provider for symptoms that are not improving over the next week or so or if Tylenol is not helping with your pain.  Return to the emergency department for symptoms of change or worsen if you are unable to schedule an appointment.

## 2019-03-02 NOTE — ED Notes (Signed)
NAD noted at time of D/C. Pt taken to lobby via wheelchair by this RN. Pt deneis comments/concerns regarding D/C instructions.

## 2019-03-02 NOTE — ED Triage Notes (Signed)
Pt states she was bent over tying her shoe and lost her balance and fell back hitting her head with a hematoma noted, also c/o tailbone and right buttock pain. Pt is a/ox4 , denies LOC. Denies being LOC.Marland Kitchen

## 2019-03-02 NOTE — ED Provider Notes (Signed)
[facility] Emergency Department Provider Note ____________________________________________   First MD Initiated Contact with Patient 03/02/19 1359     (approximate)  I have reviewed the triage vital signs and the nursing notes.   HISTORY  Chief Complaint Fall  HPI Kathryn Hammond is a 78 y.o. female presenting to the emergency department for treatment and evaluation after a mechanical, nonsyncopal fall.  She states that she had bent over to tie her shoes and just lost her balance.  She fell back and hit her right buttock on the concrete floor and the back of her head on a metal shelf. No loss of consciousness. No vision changes or nausea. No alleviating measures prior to arrival.    Past Medical History:  Diagnosis Date  . Arthritis   . Asthma   . Breast cancer ([place]) 2010   left breast cancer-DCIS  . CPAP (continuous positive airway pressure) dependence 15 years  . Depression   . Dermatitis   . Diabetes ([place])    non-insulin dependent  . Dyspnea   . Emphysema   . Fatty liver disease, nonalcoholic   . GERD (gastroesophageal reflux disease)   . History of hiatal hernia   . Hyperlipidemia   . Lung cancer ([place])   . Melanoma ([place])    right temple  . Neuromuscular disorder ([facility])    neuropathy  . Obesity, unspecified   . Personal history of malignant neoplasm of breast    left breast DCIS  . Personal history of malignant neoplasm of bronchus and lung    right lung cancer  . Personal history of radiation therapy 2010   f/u left breast cancer   . Personal history of tobacco use, presenting hazards to health   . Reflux esophagitis   . Screening for obesity   . Seasonal allergies   . Sleep apnea    cpap  . Ulcer    peptic  . Unspecified essential hypertension   . Varicose veins of lower extremities with other complications     Patient Active Problem List   Diagnosis Date Noted  . History of ductal carcinoma in situ (DCIS) of breast  09/22/2017  . Mammographic microcalcification 09/22/2017  . Varicose veins of both lower extremities with inflammation 08/19/2017  . Carotid stenosis 07/10/2016  . Hyperlipidemia 07/10/2016  . Essential hypertension 07/10/2016  . History of breast cancer 08/15/2012  . History of lung cancer 08/15/2012    Past Surgical History:  Procedure Laterality Date  . ABDOMINAL HYSTERECTOMY  1972  . BACK SURGERY  02/2018   [place]  . BREAST BIOPSY Right 2010?   Dr. Jamal Collin, benign  . BREAST BIOPSY Left 09/27/2017   Fibroadenomatoid changes associated coarse calcifications, focal columnar cell change w/ calcs  . BREAST EXCISIONAL BIOPSY Left 2010   DCIS  . BREAST LUMPECTOMY Left 2010   Radiation for DCIS  . BREAST SURGERY Left 2010   mammosite balloon placement and removal  . CATARACT EXTRACTION W/PHACO Right 09/12/2016   Procedure: CATARACT EXTRACTION PHACO AND INTRAOCULAR LENS PLACEMENT (IOC);  Surgeon: Birder Robson, MD;  Location: [facility] ORS;  Service: Ophthalmology;  Laterality: Right;  [place] 00:38.2 AP% 10.9 CDE 4.16 Fluid Pack lot # [id number]  . CATARACT EXTRACTION W/PHACO Left 10/03/2016   Procedure: CATARACT EXTRACTION PHACO AND INTRAOCULAR LENS PLACEMENT (IOC);  Surgeon: Birder Robson, MD;  Location: [facility] ORS;  Service: Ophthalmology;  Laterality: Left;  [place] 00:37 AP% 20.6 CDE 7.69 Fluid pack lot # [id number]  . COLONOSCOPY  2007  and 2012   Dr. Vira Agar at [facility]; polyps  . COLONOSCOPY WITH PROPOFOL N/A 10/28/2015   Procedure: COLONOSCOPY WITH PROPOFOL;  Surgeon: Manya Silvas, MD;  Location: [facility] ENDOSCOPY;  Service: Endoscopy;  Laterality: N/A;  . ESOPHAGOGASTRODUODENOSCOPY (EGD) WITH PROPOFOL N/A 10/28/2015   Procedure: ESOPHAGOGASTRODUODENOSCOPY (EGD) WITH PROPOFOL;  Surgeon: Manya Silvas, MD;  Location: [facility] ENDOSCOPY;  Service: Endoscopy;  Laterality: N/A;  . EYE SURGERY  1992  . HERNIA REPAIR  1990  . KNEE SURGERY  1999   knee replaced  . KNEE SURGERY  1977   knees  replaced  . LEFT HEART CATH AND CORONARY ANGIOGRAPHY Left 03/05/2017   Procedure: LEFT HEART CATH AND CORONARY ANGIOGRAPHY;  Surgeon: Teodoro Spray, MD;  Location: [place] CV LAB;  Service: Cardiovascular;  Laterality: Left;  . LOBECTOMY  1989  . MELANOMA EXCISION  December 07, 2009   right temple  . MOHS SURGERY  2016   [facility], nose  . SALPINGOOPHORECTOMY  1972  . VEIN SURGERY  September 2011   vein closure procedure left leg; RF ablation of GSV in both legs    Prior to Admission medications   Medication Sig Start Date End Date Taking? Authorizing Provider  acetaminophen (TYLENOL) 500 MG tablet Take 500-1,000 mg 2 (two) times daily as needed by mouth for mild pain.     [provider]  amLODipine (NORVASC) 10 MG tablet Take 10 mg by mouth daily.     [provider]  ARTIFICIAL TEAR OP Apply 1 drop daily as needed to eye (dry eyes).    [provider]  atorvastatin (LIPITOR) 40 MG tablet Take 40 mg at bedtime by mouth.  06/30/13   [provider]  Biotin 5000 MCG CAPS Take 10,000 mcg daily by mouth.     [provider]  Cholecalciferol (VITAMIN D3) 2000 units capsule Take 2,000 Units by mouth daily.    [provider]  clonazePAM (KLONOPIN) 0.5 MG tablet Take 0.5 mg by mouth daily as needed for anxiety.     [provider]  diclofenac (VOLTAREN) 75 MG EC tablet Take 75 mg by mouth 2 (two) times daily.    [provider]  fluticasone (FLONASE) 50 MCG/ACT nasal spray Place 2 sprays into both nostrils daily as needed for allergies.     [provider]  gabapentin (NEURONTIN) 300 MG capsule See admin instructions. Take 1 capsule (300MG ) by mouth twice daily - may take an additional capsule daily if needed 06/22/16   [provider]  hydrochlorothiazide (HYDRODIURIL) 12.5 MG tablet Take 12.5 mg at bedtime by mouth.  07/14/13   [provider]  ipratropium (ATROVENT HFA) 17 MCG/ACT inhaler Inhale 2  puffs into the lungs every 6 (six) hours as needed for wheezing.     [provider]  losartan (COZAAR) 100 MG tablet Take 100 mg at bedtime by mouth.  06/11/13   [provider]  Magnesium 250 MG TABS Take 250 mg by mouth daily.    [provider]  metFORMIN (GLUCOPHAGE-XR) 750 MG 24 hr tablet Take 750 mg by mouth 2 (two) times daily.     [provider]  metoprolol succinate (TOPROL-XL) 100 MG 24 hr tablet Take 50 mg by mouth at bedtime. Take with or immediately following a meal.    [provider]  nystatin ointment (MYCOSTATIN) Apply 1 application 3 (three) times daily as needed topically (itching).  02/23/17   [provider]  omeprazole (PRILOSEC) 40  MG capsule Take 40 mg by mouth daily.    [provider]  Propylene Glycol-Glycerin (SOOTHE OP) Apply 1 drop daily as needed to eye (dry eyes).    [provider]  traMADol (ULTRAM) 50 MG tablet Take 50 mg by mouth every 6 (six) hours as needed for pain. 11/23/17   [provider]  triamcinolone ointment (KENALOG) 0.1 % Apply 1 application 3 (three) times daily as needed topically (itching).  02/23/17   [provider]  TURMERIC PO Take 1 tablet by mouth.    [provider]  vitamin B-12 (CYANOCOBALAMIN) 1000 MCG tablet Take 1,000 mcg daily by mouth.    [provider]  vitamin E 100 UNIT capsule Take 100 Units by mouth daily.    [provider]    Allergies Balsam peru-castor [amberderm], Carafate [sucralfate], Dmdm hydantoin, Lisinopril, Peanut-containing drug products, Pepcid [famotidine], Silver, Tape, Zantac [ranitidine hcl], and Xanax [alprazolam]  Family History  Problem Relation Age of Onset  . Ovarian cancer Mother   . Breast cancer Neg Hx     Social History Social History   Tobacco Use  . Smoking status: Former Smoker    Years: 30.00    Quit date: 04/10/1988    Years since quitting: 30.9  . Smokeless tobacco:  Never Used  Substance Use Topics  . Alcohol use: Yes    Alcohol/week: 0.0 standard drinks    Comment: once in a while  . Drug use: No    Review of Systems  Constitutional: No fever/chills Eyes: No visual changes. ENT: No sore throat. Cardiovascular: Denies chest pain. Respiratory: Denies shortness of breath. Gastrointestinal: No abdominal pain.  No nausea, no vomiting. Genitourinary: Negative for hematuria since fall. Musculoskeletal: Negative for back pain. Negative for neck pain. Positive for right buttock pain. Skin: Positive for hematoma to scalp. Neurological: Negative for headaches, focal weakness or numbness. ___________________________________________   PHYSICAL EXAM:  VITAL SIGNS: ED Triage Vitals  Enc Vitals Group     BP 03/02/19 1154 (!) 160/73     Pulse Rate 03/02/19 1154 68     Resp 03/02/19 1154 18     Temp 03/02/19 1154 98 F (36.7 C)     Temp Source 03/02/19 1154 Oral     SpO2 03/02/19 1154 96 %     Weight 03/02/19 1150 190 lb (86.2 kg)     Height 03/02/19 1150 5' (1.524 m)     Head Circumference --      Peak Flow --      Pain Score 03/02/19 1149 8     Pain Loc --      Pain Edu? --      Excl. in [place]? --     Constitutional: Alert and oriented. Well appearing and in no acute distress. Eyes: Conjunctivae are normal. PERRL. Head: Hematoma to left parietal scalp. Nose: No epistaxis. Mouth/Throat: Mucous membranes are moist.  Oropharynx non-erythematous. Neck: No stridor.   Hematological/Lymphatic/Immunilogical: No cervical lymphadenopathy. Cardiovascular: Normal rate, regular rhythm. Grossly normal heart sounds.  Good peripheral circulation. Respiratory: Normal respiratory effort.  No retractions. Lungs CTAB. Gastrointestinal: Soft and nontender. Genitourinary:  Musculoskeletal: Demonstrates unrestricted ROM of extremities. Tenderness with palpation over right sacrum and coccyx. Neurologic:  Normal speech and language. No gross focal neurologic  deficits are appreciated. No gait instability. Skin:  Skin is warm, dry and intact. No rash noted. Psychiatric: Mood and affect are normal. Speech and behavior are normal.  ____________________________________________   LABS (all labs ordered are listed,  but only abnormal results are displayed)  Labs Reviewed - No data to display ____________________________________________  EKG  Not indicated. ____________________________________________  RADIOLOGY  ED MD interpretation:    No acute findings on the image of the lumbar spine or right hip.  CT of the head is negative for acute abnormalities  Official radiology report(s): Dg Lumbar Spine 2-3 Views  Result Date: 03/02/2019 CLINICAL DATA:  Fall. Tailbone/right buttock pain. EXAM: LUMBAR SPINE - 2-3 VIEW COMPARISON:  04/15/2013 CT abdomen/pelvis FINDINGS: This report assumes 5 non rib-bearing lumbar vertebrae. Lumbar vertebral body heights are preserved, with no fracture. Lumbar disc heights are preserved. No spondylosis. Minimal 2 mm anterolisthesis at L4-5. Mild 4 mm anterolisthesis at L5-S1. Moderate bilateral lower lumbar facet arthropathy. No aggressive appearing focal osseous lesions. Abdominal aortic atherosclerosis. IMPRESSION: 1. No lumbar spine fracture. 2. Moderate lower lumbar facet arthropathy with mild multilevel lower lumbar spondylolisthesis as detailed. Electronically Signed   By: Ilona Sorrel M.D.   On: 03/02/2019 12:54   Ct Head Wo Contrast  Result Date: 03/02/2019 CLINICAL DATA:  Head trauma, ataxia EXAM: CT HEAD WITHOUT CONTRAST TECHNIQUE: Contiguous axial images were obtained from the base of the skull through the vertex without intravenous contrast. COMPARISON:  CT head dated 04/09/2013 FINDINGS: Brain: No evidence of acute infarction, hemorrhage, hydrocephalus, extra-axial collection or mass lesion/mass effect. There is mild cerebral volume loss with associated ex vacuo dilatation. Periventricular white matter  hypoattenuation likely represents chronic small vessel ischemic disease. Vascular: There are vascular calcifications in the carotid siphons. Skull: Normal. Negative for fracture or focal lesion. Sinuses/Orbits: No acute finding. Other: A soft tissue hematoma overlies the left parietal and occipital bones. IMPRESSION: No acute intracranial process. Electronically Signed   By: Zerita Boers M.D.   On: 03/02/2019 13:47   Dg Hip Unilat W Or Wo Pelvis 2-3 Views Right  Result Date: 03/02/2019 CLINICAL DATA:  Tailbone pain radiating to the right buttock after fall. EXAM: DG HIP (WITH OR WITHOUT PELVIS) 2-3V RIGHT COMPARISON:  04/15/2013 CT abdomen/pelvis FINDINGS: No pelvic fracture or diastasis. No right hip fracture or dislocation. Minimal osteoarthritis in the weight-bearing portions of the hip joints. Prominent degenerative changes at the symphysis pubis and right sacroiliac joints. Degenerative changes in the lower lumbar spine. No suspicious focal osseous lesions. IMPRESSION: 1. No fracture. No right hip dislocation. 2. Minimal osteoarthritis in the hip joints. Electronically Signed   By: Ilona Sorrel M.D.   On: 03/02/2019 12:56    ____________________________________________   PROCEDURES  Procedure(s) performed (including Critical Care):  Procedures  ____________________________________________   INITIAL IMPRESSION / ASSESSMENT AND PLAN     78 year old female presenting to the emergency department after mechanical, nonsyncopal fall while at a store just prior to arrival.  See HPI for further details.  DIFFERENTIAL DIAGNOSIS  Intracranial hemorrhage, fracture in the sacrum or coccyx, hip fracture, musculoskeletal pain, hematoma of the scalp.  ED COURSE  CT of the head and images of the hip and sacrum and coccyx are all negative for acute findings.  The patient is able to ambulate without assistance.  She would like to take Tylenol for her pain.  She is to use ice over the sore areas  about 20 minutes at a time off and on throughout the next couple days.  She is to follow-up with her primary care provider for symptoms of concern and if Tylenol is not managing her pain well. ____________________________________________   FINAL CLINICAL IMPRESSION(S) / ED DIAGNOSES  Final diagnoses:  Fall, initial  encounter  Minor head injury, initial encounter  Hematoma of scalp, initial encounter  Coccyx contusion, initial encounter     ED Discharge Orders    None       Note:  This document was prepared using Dragon voice recognition software and may include unintentional dictation errors.   Victorino Dike, FNP 03/02/19 1423    Arta Silence, MD 03/02/19 1530

## 2019-03-28 DIAGNOSIS — Z794 Long term (current) use of insulin: Secondary | ICD-10-CM | POA: Diagnosis not present

## 2019-03-28 DIAGNOSIS — E119 Type 2 diabetes mellitus without complications: Secondary | ICD-10-CM | POA: Diagnosis not present

## 2019-03-28 DIAGNOSIS — G8929 Other chronic pain: Secondary | ICD-10-CM | POA: Diagnosis not present

## 2019-03-28 DIAGNOSIS — I10 Essential (primary) hypertension: Secondary | ICD-10-CM | POA: Diagnosis not present

## 2019-03-28 DIAGNOSIS — I25118 Atherosclerotic heart disease of native coronary artery with other forms of angina pectoris: Secondary | ICD-10-CM | POA: Diagnosis not present

## 2019-03-28 DIAGNOSIS — M5441 Lumbago with sciatica, right side: Secondary | ICD-10-CM | POA: Diagnosis not present

## 2019-03-28 DIAGNOSIS — E782 Mixed hyperlipidemia: Secondary | ICD-10-CM | POA: Diagnosis not present

## 2019-04-01 DIAGNOSIS — I10 Essential (primary) hypertension: Secondary | ICD-10-CM | POA: Diagnosis not present

## 2019-04-01 DIAGNOSIS — E119 Type 2 diabetes mellitus without complications: Secondary | ICD-10-CM | POA: Diagnosis not present

## 2019-04-01 DIAGNOSIS — Z794 Long term (current) use of insulin: Secondary | ICD-10-CM | POA: Diagnosis not present

## 2019-04-01 DIAGNOSIS — E782 Mixed hyperlipidemia: Secondary | ICD-10-CM | POA: Diagnosis not present

## 2019-04-01 DIAGNOSIS — I25118 Atherosclerotic heart disease of native coronary artery with other forms of angina pectoris: Secondary | ICD-10-CM | POA: Diagnosis not present

## 2019-04-21 DIAGNOSIS — E782 Mixed hyperlipidemia: Secondary | ICD-10-CM | POA: Diagnosis not present

## 2019-04-21 DIAGNOSIS — G4730 Sleep apnea, unspecified: Secondary | ICD-10-CM | POA: Diagnosis not present

## 2019-04-21 DIAGNOSIS — Z23 Encounter for immunization: Secondary | ICD-10-CM | POA: Diagnosis not present

## 2019-04-21 DIAGNOSIS — I10 Essential (primary) hypertension: Secondary | ICD-10-CM | POA: Diagnosis not present

## 2019-04-21 DIAGNOSIS — I25118 Atherosclerotic heart disease of native coronary artery with other forms of angina pectoris: Secondary | ICD-10-CM | POA: Diagnosis not present

## 2019-05-21 DIAGNOSIS — Z85820 Personal history of malignant melanoma of skin: Secondary | ICD-10-CM | POA: Diagnosis not present

## 2019-05-21 DIAGNOSIS — D2261 Melanocytic nevi of right upper limb, including shoulder: Secondary | ICD-10-CM | POA: Diagnosis not present

## 2019-05-21 DIAGNOSIS — Z85828 Personal history of other malignant neoplasm of skin: Secondary | ICD-10-CM | POA: Diagnosis not present

## 2019-05-21 DIAGNOSIS — L659 Nonscarring hair loss, unspecified: Secondary | ICD-10-CM | POA: Diagnosis not present

## 2019-05-21 DIAGNOSIS — D2262 Melanocytic nevi of left upper limb, including shoulder: Secondary | ICD-10-CM | POA: Diagnosis not present

## 2019-05-21 DIAGNOSIS — L218 Other seborrheic dermatitis: Secondary | ICD-10-CM | POA: Diagnosis not present

## 2019-05-21 DIAGNOSIS — L820 Inflamed seborrheic keratosis: Secondary | ICD-10-CM | POA: Diagnosis not present

## 2019-05-21 DIAGNOSIS — L538 Other specified erythematous conditions: Secondary | ICD-10-CM | POA: Diagnosis not present

## 2019-05-21 DIAGNOSIS — L570 Actinic keratosis: Secondary | ICD-10-CM | POA: Diagnosis not present

## 2019-05-21 DIAGNOSIS — X32XXXA Exposure to sunlight, initial encounter: Secondary | ICD-10-CM | POA: Diagnosis not present

## 2019-05-21 DIAGNOSIS — D225 Melanocytic nevi of trunk: Secondary | ICD-10-CM | POA: Diagnosis not present

## 2019-06-05 DIAGNOSIS — H9201 Otalgia, right ear: Secondary | ICD-10-CM | POA: Diagnosis not present

## 2019-06-05 DIAGNOSIS — H6063 Unspecified chronic otitis externa, bilateral: Secondary | ICD-10-CM | POA: Diagnosis not present

## 2019-06-05 DIAGNOSIS — H6123 Impacted cerumen, bilateral: Secondary | ICD-10-CM | POA: Diagnosis not present

## 2019-06-25 DIAGNOSIS — E119 Type 2 diabetes mellitus without complications: Secondary | ICD-10-CM | POA: Diagnosis not present

## 2019-06-25 DIAGNOSIS — R42 Dizziness and giddiness: Secondary | ICD-10-CM | POA: Diagnosis not present

## 2019-06-25 DIAGNOSIS — Z794 Long term (current) use of insulin: Secondary | ICD-10-CM | POA: Diagnosis not present

## 2019-06-25 DIAGNOSIS — I10 Essential (primary) hypertension: Secondary | ICD-10-CM | POA: Diagnosis not present

## 2019-07-01 DIAGNOSIS — R0600 Dyspnea, unspecified: Secondary | ICD-10-CM | POA: Diagnosis not present

## 2019-07-01 DIAGNOSIS — G4733 Obstructive sleep apnea (adult) (pediatric): Secondary | ICD-10-CM | POA: Diagnosis not present

## 2019-07-01 DIAGNOSIS — E663 Overweight: Secondary | ICD-10-CM | POA: Diagnosis not present

## 2019-07-01 DIAGNOSIS — J4520 Mild intermittent asthma, uncomplicated: Secondary | ICD-10-CM | POA: Diagnosis not present

## 2019-07-24 DIAGNOSIS — S50312A Abrasion of left elbow, initial encounter: Secondary | ICD-10-CM | POA: Diagnosis not present

## 2019-07-24 DIAGNOSIS — Z9181 History of falling: Secondary | ICD-10-CM | POA: Diagnosis not present

## 2019-08-20 DIAGNOSIS — L820 Inflamed seborrheic keratosis: Secondary | ICD-10-CM | POA: Diagnosis not present

## 2019-08-20 DIAGNOSIS — X32XXXA Exposure to sunlight, initial encounter: Secondary | ICD-10-CM | POA: Diagnosis not present

## 2019-08-20 DIAGNOSIS — L538 Other specified erythematous conditions: Secondary | ICD-10-CM | POA: Diagnosis not present

## 2019-08-20 DIAGNOSIS — D485 Neoplasm of uncertain behavior of skin: Secondary | ICD-10-CM | POA: Diagnosis not present

## 2019-08-20 DIAGNOSIS — D0439 Carcinoma in situ of skin of other parts of face: Secondary | ICD-10-CM | POA: Diagnosis not present

## 2019-08-20 DIAGNOSIS — L570 Actinic keratosis: Secondary | ICD-10-CM | POA: Diagnosis not present

## 2019-08-21 ENCOUNTER — Other Ambulatory Visit: Payer: Self-pay

## 2019-08-21 ENCOUNTER — Ambulatory Visit (INDEPENDENT_AMBULATORY_CARE_PROVIDER_SITE_OTHER): Payer: PPO

## 2019-08-21 ENCOUNTER — Ambulatory Visit (INDEPENDENT_AMBULATORY_CARE_PROVIDER_SITE_OTHER): Payer: PPO | Admitting: Vascular Surgery

## 2019-08-21 ENCOUNTER — Encounter (INDEPENDENT_AMBULATORY_CARE_PROVIDER_SITE_OTHER): Payer: Self-pay | Admitting: Vascular Surgery

## 2019-08-21 VITALS — BP 138/71 | HR 63 | Resp 16 | Wt 190.6 lb

## 2019-08-21 DIAGNOSIS — I10 Essential (primary) hypertension: Secondary | ICD-10-CM

## 2019-08-21 DIAGNOSIS — I6523 Occlusion and stenosis of bilateral carotid arteries: Secondary | ICD-10-CM

## 2019-08-21 DIAGNOSIS — E785 Hyperlipidemia, unspecified: Secondary | ICD-10-CM

## 2019-08-21 DIAGNOSIS — I872 Venous insufficiency (chronic) (peripheral): Secondary | ICD-10-CM | POA: Diagnosis not present

## 2019-08-21 NOTE — Progress Notes (Signed)
MRN : [id number]  Kathryn Hammond is a 79 y.o. ([birth year]) female who presents with chief complaint of  Chief Complaint  Patient presents with  . Follow-up    ultrasound follow up  .  History of Present Illness:   The patient is seen for follow up evaluation of carotid stenosis. The carotid stenosis followed by ultrasound.   The patient denies amaurosis fugax. There is no recent history of TIA symptoms or focal motor deficits. There is no prior documented CVA.  The patient is taking enteric-coated aspirin 81 mg daily.  There is no history of migraine headaches. There is no history of seizures.  The patient has a history of coronary artery disease, no recent episodes of angina or shortness of breath. The patient denies PAD or claudication symptoms. There is a history of hyperlipidemia which is being treated with a statin.   Carotid Duplex done today shows 1 to 39% stenosis bilaterally.  No change compared to last study.  Current Meds  Medication Sig  . acetaminophen (TYLENOL) 500 MG tablet Take 500-1,000 mg 2 (two) times daily as needed by mouth for mild pain.   Marland Kitchen albuterol (VENTOLIN HFA) 108 (90 Base) MCG/ACT inhaler INHALE 2 PUFFS BY MOUTH EVERY 6 HOURS AS NEEDED FOR WHEEZE  . amLODipine (NORVASC) 10 MG tablet Take 10 mg by mouth daily.   . ARTIFICIAL TEAR OP Apply 1 drop daily as needed to eye (dry eyes).  . Biotin 5000 MCG CAPS Take 10,000 mcg daily by mouth.   . Cholecalciferol (VITAMIN D3) 2000 units capsule Take 2,000 Units by mouth daily.  . clonazePAM (KLONOPIN) 0.5 MG tablet Take 0.5 mg by mouth daily as needed for anxiety.   . diclofenac (VOLTAREN) 75 MG EC tablet Take 75 mg by mouth 2 (two) times daily.  . fluticasone (FLONASE) 50 MCG/ACT nasal spray Place 2 sprays into both nostrils daily as needed for allergies.   Marland Kitchen gabapentin (NEURONTIN) 300 MG capsule See admin instructions. Take 1 capsule (300MG ) by mouth twice daily - may take an additional capsule  daily if needed  . hydrochlorothiazide (HYDRODIURIL) 12.5 MG tablet Take 12.5 mg at bedtime by mouth.   Marland Kitchen ipratropium (ATROVENT HFA) 17 MCG/ACT inhaler Inhale 2 puffs into the lungs every 6 (six) hours as needed for wheezing.   Marland Kitchen losartan (COZAAR) 100 MG tablet Take 100 mg at bedtime by mouth.   . metFORMIN (GLUCOPHAGE-XR) 750 MG 24 hr tablet Take 750 mg by mouth 2 (two) times daily.   Marland Kitchen nystatin ointment (MYCOSTATIN) Apply 1 application 3 (three) times daily as needed topically (itching).   . Propylene Glycol-Glycerin (SOOTHE OP) Apply 1 drop daily as needed to eye (dry eyes).  . triamcinolone ointment (KENALOG) 0.1 % Apply 1 application 3 (three) times daily as needed topically (itching).   Marland Kitchen UNABLE TO FIND at bedtime. CPAP  . vitamin B-12 (CYANOCOBALAMIN) 1000 MCG tablet Take 1,000 mcg daily by mouth.    Past Medical History:  Diagnosis Date  . Arthritis   . Asthma   . Breast cancer ([place]) 2010   left breast cancer-DCIS  . CPAP (continuous positive airway pressure) dependence 15 years  . Depression   . Dermatitis   . Diabetes ([place])    non-insulin dependent  . Dyspnea   . Emphysema   . Fatty liver disease, nonalcoholic   . GERD (gastroesophageal reflux disease)   . History of hiatal hernia   . Hyperlipidemia   . Lung cancer ([place])   .  Melanoma ([place])    right temple  . Neuromuscular disorder ([facility])    neuropathy  . Obesity, unspecified   . Personal history of malignant neoplasm of breast    left breast DCIS  . Personal history of malignant neoplasm of bronchus and lung    right lung cancer  . Personal history of radiation therapy 2010   f/u left breast cancer   . Personal history of tobacco use, presenting hazards to health   . Reflux esophagitis   . Screening for obesity   . Seasonal allergies   . Sleep apnea    cpap  . Ulcer    peptic  . Unspecified essential hypertension   . Varicose veins of lower extremities with other complications     Past Surgical History:    Procedure Laterality Date  . ABDOMINAL HYSTERECTOMY  1972  . BACK SURGERY  02/2018   [place]  . BREAST BIOPSY Right 2010?   Dr. Jamal Collin, benign  . BREAST BIOPSY Left 09/27/2017   Fibroadenomatoid changes associated coarse calcifications, focal columnar cell change w/ calcs  . BREAST EXCISIONAL BIOPSY Left 2010   DCIS  . BREAST LUMPECTOMY Left 2010   Radiation for DCIS  . BREAST SURGERY Left 2010   mammosite balloon placement and removal  . CATARACT EXTRACTION W/PHACO Right 09/12/2016   Procedure: CATARACT EXTRACTION PHACO AND INTRAOCULAR LENS PLACEMENT (IOC);  Surgeon: Birder Robson, MD;  Location: [facility] ORS;  Service: Ophthalmology;  Laterality: Right;  [place] 00:38.2 AP% 10.9 CDE 4.16 Fluid Pack lot # [id number]  . CATARACT EXTRACTION W/PHACO Left 10/03/2016   Procedure: CATARACT EXTRACTION PHACO AND INTRAOCULAR LENS PLACEMENT (IOC);  Surgeon: Birder Robson, MD;  Location: [facility] ORS;  Service: Ophthalmology;  Laterality: Left;  [place] 00:37 AP% 20.6 CDE 7.69 Fluid pack lot # [id number]  . COLONOSCOPY  2007 and 2012   Dr[facility] Vira [facility]ar at [facility]; polyps  . COLONOSCOPY WITH PROPOFOL N/A 10/28/2015   Procedure: COLONOSCOPY WITH PROPOFOL;  Surgeon: Manya Silvas, MD;  Location[facility] ENDOSCOPY;  Service: Endoscopy;  Laterality: N/A;  . ESOPHAGOGASTRODUODENOSCOPY (EGD) WITH PROPOFOL N/A 10/28/2015   Procedure: ESOPHAGOGASTRODUODENOSCOPY (EGD) WITH PROPOFOL;  Surgeon: Manya Silvas, MD;  Location: [facility] ENDOSCOPY;  Service: Endoscopy;  Laterality: N/A;  . EYE SURGERY  1992  . HERNIA REPAIR  1990  . KNEE SURGERY  1999   knee replaced  . KNEE SURGERY  1977   knees replaced  . LEFT HEART CATH AND CORONARY ANGIOGRAPHY Left 03/05/2017   Procedure: LEFT HEART CATH AND CORONARY ANGIOGRAPHY;  Surgeon: Teodoro Spray, MD;  Location: [place] CV LAB;  Service: Cardiovascular;  Laterality: Left;  . LOBECTOMY  1989  . MELANOMA EXCISION  December 07, 2009   right temple  . MOHS SURGERY  2016   [facility],  nose  . SALPINGOOPHORECTOMY  1972  . VEIN SURGERY  September 2011   vein closure procedure left leg; RF ablation of GSV in both legs    Social History Social History   Tobacco Use  . Smoking status: Former Smoker    Years: 30.00    Quit date: 04/10/1988    Years since quitting: 31.3  . Smokeless tobacco: Never Used  Substance Use Topics  . Alcohol use: Yes    Alcohol/week: 0.0 standard drinks    Comment: once in a while  . Drug use: No    Family History Family History  Problem Relation Age of Onset  . Ovarian cancer Mother   . Breast cancer Neg  Hx     Allergies  Allergen Reactions  . Ranitidine Hcl Hives    Other reaction(s): Unknown  . Balsam [place]-Castor [Amberderm] Other (See Comments)    Unknown  [facility] - positive patch test   . Carafate [Sucralfate] Itching  . Dmdm Hydantoin Other (See Comments)    Unknown  According to [facility] - positive patch tes  . Lisinopril Swelling  . Peanut-Containing Drug Products Swelling  . Pepcid [Famotidine] Itching  . Silver Other (See Comments)    blisters  . Tape Itching    Redness   . Zantac [Ranitidine Hcl] Hives  . Xanax [Alprazolam] Rash     REVIEW OF SYSTEMS (Negative unless checked)  Constitutional: [] Weight loss  [] Fever  [] Chills Cardiac: [] Chest pain   [] Chest pressure   [] Palpitations   [] Shortness of breath when laying flat   [] Shortness of breath with exertion. Vascular:  [] Pain in legs with walking   [] Pain in legs at rest  [] History of DVT   [] Phlebitis   [] Swelling in legs   [] Varicose veins   [] Non-healing ulcers Pulmonary:   [] Uses home oxygen   [] Productive cough   [] Hemoptysis   [] Wheeze  [] COPD   [] Asthma Neurologic:  [] Dizziness   [] Seizures   [] History of stroke   [] History of TIA  [] Aphasia   [] Vissual changes   [] Weakness or numbness in arm   [] Weakness or numbness in leg Musculoskeletal:   [] Joint swelling   [] Joint pain   [] Low back pain Hematologic:  [] Easy bruising  [] Easy bleeding    [] Hypercoagulable state   [] Anemic Gastrointestinal:  [] Diarrhea   [] Vomiting  [] Gastroesophageal reflux/heartburn   [] Difficulty swallowing. Genitourinary:  [] Chronic kidney disease   [] Difficult urination  [] Frequent urination   [] Blood in urine Skin:  [] Rashes   [] Ulcers  Psychological:  [] History of anxiety   []  History of major depression.  Physical Examination  Vitals:   08/21/19 1436  BP: 138/71  Pulse: 63  Resp: 16  Weight: 190 lb 9.6 oz (86.5 kg)   Body mass index is 37.22 kg/m. Gen: WD/WN, NAD Head: [place]/AT, No temporalis wasting.  Ear/Nose/Throat: Hearing grossly intact, nares w/o erythema or drainage Eyes: PER, EOMI, sclera nonicteric.  Neck: Supple, no large masses.   Pulmonary:  Good air movement, no audible wheezing bilaterally, no use of accessory muscles.  Cardiac: RRR, no JVD Vascular: No carotid bruits Vessel Right Left  Radial Palpable Palpable  Carotid Palpable Palpable  Gastrointestinal: Non-distended. No guarding/no peritoneal signs.  Musculoskeletal: M/S 5/5 throughout.  No deformity or atrophy.  Neurologic: CN 2-12 intact. Symmetrical.  Speech is fluent. Motor exam as listed above. Psychiatric: Judgment intact, Mood & affect appropriate for pt's clinical situation. Dermatologic: No rashes or ulcers noted.  No changes consistent with cellulitis.   CBC Lab Results  Component Value Date   WBC 5.6 12/13/2017   HGB 13.5 12/13/2017   HCT 38.8 12/13/2017   MCV 85.4 12/13/2017   PLT 203 12/13/2017    BMET    Component Value Date/Time   NA 135 12/13/2017 0759   NA 132 (L) 04/14/2013 2002   K 3.4 (L) 12/13/2017 0759   K 4.7 04/14/2013 2002   CL 97 (L) 12/13/2017 0759   CL 100 04/14/2013 2002   CO2 26 12/13/2017 0759   CO2 26 04/14/2013 2002   GLUCOSE 144 (H) 12/13/2017 0759   GLUCOSE 131 (H) 04/14/2013 2002   BUN 16 12/13/2017 0759   BUN 23 (H) 04/14/2013 2002  CREATININE 0.91 12/13/2017 0759   CREATININE 0.63 04/14/2013 2002   CALCIUM 8.6  (L) 12/13/2017 0759   CALCIUM 9.1 04/14/2013 2002   GFRNONAA 59 (L) 12/13/2017 0759   GFRNONAA >60 04/14/2013 2002   GFRAA >60 12/13/2017 0759   GFRAA >60 04/14/2013 2002   CrCl cannot be calculated (Patient's most recent lab result is older than the maximum 21 days allowed.).  COAG No results found for: INR, PROTIME  Radiology No results found.    Assessment/Plan 1. Bilateral carotid artery stenosis Recommend:  Given the patient's asymptomatic subcritical stenosis no further invasive testing or surgery at this time.  Duplex ultrasound shows 1-39% stenosis bilaterally.  Continue antiplatelet therapy as prescribed Continue management of CAD, HTN and Hyperlipidemia Healthy heart diet,  encouraged exercise at least 4 times per week Follow up in 24 months with duplex ultrasound and physical exam.   - VAS US CAROTID; Future  2. Chronic venous insufficiency No surgery or intervention at this point in time.  I have reviewed my discussion with the patient regarding venous insufficiency and why it causes symptoms. I have discussed with the patient the chronic skin changes that accompany venous insufficiency and the long term sequela such as ulceration. Patient will contnue wearing graduated compression stockings on a daily basis, as this has provided excellent control of his edema. The patient will put the stockings on first thing in the morning and removing them in the evening. The patient is reminded not to sleep in the stockings.  In addition, behavioral modification including elevation during the day will be initiated. Exercise is strongly encouraged.  3. Hyperlipidemia, unspecified hyperlipidemia type Continue statin as ordered and reviewed, no changes at this time   4. Essential hypertension Continue antihypertensive medications as already ordered, these medications have been reviewed and there are no changes at this time.    Hortencia Pilar, MD  08/21/2019 3:17  PM

## 2019-09-02 ENCOUNTER — Other Ambulatory Visit: Payer: Self-pay | Admitting: Family Medicine

## 2019-09-02 DIAGNOSIS — Z1231 Encounter for screening mammogram for malignant neoplasm of breast: Secondary | ICD-10-CM

## 2019-09-15 DIAGNOSIS — D0439 Carcinoma in situ of skin of other parts of face: Secondary | ICD-10-CM | POA: Diagnosis not present

## 2019-09-15 DIAGNOSIS — B372 Candidiasis of skin and nail: Secondary | ICD-10-CM | POA: Diagnosis not present

## 2019-09-15 DIAGNOSIS — L570 Actinic keratosis: Secondary | ICD-10-CM | POA: Diagnosis not present

## 2019-09-26 DIAGNOSIS — M810 Age-related osteoporosis without current pathological fracture: Secondary | ICD-10-CM | POA: Diagnosis not present

## 2019-09-26 DIAGNOSIS — E119 Type 2 diabetes mellitus without complications: Secondary | ICD-10-CM | POA: Diagnosis not present

## 2019-09-26 DIAGNOSIS — Z0000 Encounter for general adult medical examination without abnormal findings: Secondary | ICD-10-CM | POA: Diagnosis not present

## 2019-09-26 DIAGNOSIS — E782 Mixed hyperlipidemia: Secondary | ICD-10-CM | POA: Diagnosis not present

## 2019-09-26 DIAGNOSIS — I10 Essential (primary) hypertension: Secondary | ICD-10-CM | POA: Diagnosis not present

## 2019-09-26 DIAGNOSIS — I25118 Atherosclerotic heart disease of native coronary artery with other forms of angina pectoris: Secondary | ICD-10-CM | POA: Diagnosis not present

## 2019-09-26 DIAGNOSIS — Z794 Long term (current) use of insulin: Secondary | ICD-10-CM | POA: Diagnosis not present

## 2019-09-26 DIAGNOSIS — K219 Gastro-esophageal reflux disease without esophagitis: Secondary | ICD-10-CM | POA: Diagnosis not present

## 2019-10-02 DIAGNOSIS — M2012 Hallux valgus (acquired), left foot: Secondary | ICD-10-CM | POA: Diagnosis not present

## 2019-10-02 DIAGNOSIS — M2042 Other hammer toe(s) (acquired), left foot: Secondary | ICD-10-CM | POA: Diagnosis not present

## 2019-10-02 DIAGNOSIS — M79674 Pain in right toe(s): Secondary | ICD-10-CM | POA: Diagnosis not present

## 2019-10-02 DIAGNOSIS — E119 Type 2 diabetes mellitus without complications: Secondary | ICD-10-CM | POA: Diagnosis not present

## 2019-10-02 DIAGNOSIS — M2041 Other hammer toe(s) (acquired), right foot: Secondary | ICD-10-CM | POA: Diagnosis not present

## 2019-10-02 DIAGNOSIS — L851 Acquired keratosis [keratoderma] palmaris et plantaris: Secondary | ICD-10-CM | POA: Diagnosis not present

## 2019-10-02 DIAGNOSIS — M79675 Pain in left toe(s): Secondary | ICD-10-CM | POA: Diagnosis not present

## 2019-10-02 DIAGNOSIS — L600 Ingrowing nail: Secondary | ICD-10-CM | POA: Diagnosis not present

## 2019-10-02 DIAGNOSIS — B351 Tinea unguium: Secondary | ICD-10-CM | POA: Diagnosis not present

## 2019-10-02 DIAGNOSIS — M2011 Hallux valgus (acquired), right foot: Secondary | ICD-10-CM | POA: Diagnosis not present

## 2019-10-06 DIAGNOSIS — M8588 Other specified disorders of bone density and structure, other site: Secondary | ICD-10-CM | POA: Diagnosis not present

## 2019-10-06 DIAGNOSIS — F439 Reaction to severe stress, unspecified: Secondary | ICD-10-CM | POA: Diagnosis not present

## 2019-10-06 DIAGNOSIS — R300 Dysuria: Secondary | ICD-10-CM | POA: Diagnosis not present

## 2019-10-14 ENCOUNTER — Ambulatory Visit
Admission: RE | Admit: 2019-10-14 | Discharge: 2019-10-14 | Disposition: A | Discharge status: Home / Self Care | Payer: PPO | Source: Ambulatory Visit | Attending: Family Medicine | Admitting: Family Medicine

## 2019-10-14 DIAGNOSIS — Z1231 Encounter for screening mammogram for malignant neoplasm of breast: Secondary | ICD-10-CM | POA: Insufficient documentation

## 2019-11-12 DIAGNOSIS — E119 Type 2 diabetes mellitus without complications: Secondary | ICD-10-CM | POA: Diagnosis not present

## 2019-11-14 DIAGNOSIS — R300 Dysuria: Secondary | ICD-10-CM | POA: Diagnosis not present

## 2019-11-14 DIAGNOSIS — R8290 Unspecified abnormal findings in urine: Secondary | ICD-10-CM | POA: Diagnosis not present

## 2019-11-17 DIAGNOSIS — R300 Dysuria: Secondary | ICD-10-CM | POA: Diagnosis not present

## 2019-11-17 DIAGNOSIS — R8290 Unspecified abnormal findings in urine: Secondary | ICD-10-CM | POA: Diagnosis not present

## 2019-11-24 DIAGNOSIS — L821 Other seborrheic keratosis: Secondary | ICD-10-CM | POA: Diagnosis not present

## 2019-11-24 DIAGNOSIS — D225 Melanocytic nevi of trunk: Secondary | ICD-10-CM | POA: Diagnosis not present

## 2019-11-24 DIAGNOSIS — D2272 Melanocytic nevi of left lower limb, including hip: Secondary | ICD-10-CM | POA: Diagnosis not present

## 2019-11-24 DIAGNOSIS — Z85820 Personal history of malignant melanoma of skin: Secondary | ICD-10-CM | POA: Diagnosis not present

## 2019-11-24 DIAGNOSIS — X32XXXA Exposure to sunlight, initial encounter: Secondary | ICD-10-CM | POA: Diagnosis not present

## 2019-11-24 DIAGNOSIS — L570 Actinic keratosis: Secondary | ICD-10-CM | POA: Diagnosis not present

## 2019-11-24 DIAGNOSIS — Z85828 Personal history of other malignant neoplasm of skin: Secondary | ICD-10-CM | POA: Diagnosis not present

## 2019-11-24 DIAGNOSIS — D2262 Melanocytic nevi of left upper limb, including shoulder: Secondary | ICD-10-CM | POA: Diagnosis not present

## 2019-12-01 DIAGNOSIS — N390 Urinary tract infection, site not specified: Secondary | ICD-10-CM | POA: Diagnosis not present

## 2019-12-01 DIAGNOSIS — K219 Gastro-esophageal reflux disease without esophagitis: Secondary | ICD-10-CM | POA: Diagnosis not present

## 2019-12-01 DIAGNOSIS — E782 Mixed hyperlipidemia: Secondary | ICD-10-CM | POA: Diagnosis not present

## 2019-12-01 DIAGNOSIS — M810 Age-related osteoporosis without current pathological fracture: Secondary | ICD-10-CM | POA: Diagnosis not present

## 2019-12-01 DIAGNOSIS — E538 Deficiency of other specified B group vitamins: Secondary | ICD-10-CM | POA: Diagnosis not present

## 2019-12-01 DIAGNOSIS — E119 Type 2 diabetes mellitus without complications: Secondary | ICD-10-CM | POA: Diagnosis not present

## 2019-12-01 DIAGNOSIS — Z794 Long term (current) use of insulin: Secondary | ICD-10-CM | POA: Diagnosis not present

## 2019-12-01 DIAGNOSIS — I10 Essential (primary) hypertension: Secondary | ICD-10-CM | POA: Diagnosis not present

## 2020-01-06 DIAGNOSIS — J4520 Mild intermittent asthma, uncomplicated: Secondary | ICD-10-CM | POA: Diagnosis not present

## 2020-01-06 DIAGNOSIS — G4733 Obstructive sleep apnea (adult) (pediatric): Secondary | ICD-10-CM | POA: Diagnosis not present

## 2020-01-06 DIAGNOSIS — J310 Chronic rhinitis: Secondary | ICD-10-CM | POA: Diagnosis not present

## 2020-03-22 DIAGNOSIS — I25118 Atherosclerotic heart disease of native coronary artery with other forms of angina pectoris: Secondary | ICD-10-CM | POA: Diagnosis not present

## 2020-03-22 DIAGNOSIS — M47816 Spondylosis without myelopathy or radiculopathy, lumbar region: Secondary | ICD-10-CM | POA: Diagnosis not present

## 2020-03-22 DIAGNOSIS — I10 Essential (primary) hypertension: Secondary | ICD-10-CM | POA: Diagnosis not present

## 2020-03-22 DIAGNOSIS — E119 Type 2 diabetes mellitus without complications: Secondary | ICD-10-CM | POA: Diagnosis not present

## 2020-03-22 DIAGNOSIS — Z794 Long term (current) use of insulin: Secondary | ICD-10-CM | POA: Diagnosis not present

## 2020-03-22 DIAGNOSIS — E782 Mixed hyperlipidemia: Secondary | ICD-10-CM | POA: Diagnosis not present

## 2020-03-22 DIAGNOSIS — K219 Gastro-esophageal reflux disease without esophagitis: Secondary | ICD-10-CM | POA: Diagnosis not present

## 2020-04-01 DIAGNOSIS — D485 Neoplasm of uncertain behavior of skin: Secondary | ICD-10-CM | POA: Diagnosis not present

## 2020-04-01 DIAGNOSIS — C44391 Other specified malignant neoplasm of skin of nose: Secondary | ICD-10-CM | POA: Diagnosis not present

## 2020-04-21 DIAGNOSIS — K219 Gastro-esophageal reflux disease without esophagitis: Secondary | ICD-10-CM | POA: Diagnosis not present

## 2020-04-21 DIAGNOSIS — I25118 Atherosclerotic heart disease of native coronary artery with other forms of angina pectoris: Secondary | ICD-10-CM | POA: Diagnosis not present

## 2020-04-21 DIAGNOSIS — Z794 Long term (current) use of insulin: Secondary | ICD-10-CM | POA: Diagnosis not present

## 2020-04-21 DIAGNOSIS — E782 Mixed hyperlipidemia: Secondary | ICD-10-CM | POA: Diagnosis not present

## 2020-04-21 DIAGNOSIS — I10 Essential (primary) hypertension: Secondary | ICD-10-CM | POA: Diagnosis not present

## 2020-04-21 DIAGNOSIS — G4733 Obstructive sleep apnea (adult) (pediatric): Secondary | ICD-10-CM | POA: Diagnosis not present

## 2020-04-21 DIAGNOSIS — E119 Type 2 diabetes mellitus without complications: Secondary | ICD-10-CM | POA: Diagnosis not present

## 2020-04-21 DIAGNOSIS — E538 Deficiency of other specified B group vitamins: Secondary | ICD-10-CM | POA: Diagnosis not present

## 2020-04-27 DIAGNOSIS — M549 Dorsalgia, unspecified: Secondary | ICD-10-CM | POA: Diagnosis not present

## 2020-04-28 DIAGNOSIS — S46912A Strain of unspecified muscle, fascia and tendon at shoulder and upper arm level, left arm, initial encounter: Secondary | ICD-10-CM | POA: Diagnosis not present

## 2020-05-11 DIAGNOSIS — F419 Anxiety disorder, unspecified: Secondary | ICD-10-CM | POA: Diagnosis not present

## 2020-05-11 DIAGNOSIS — F3340 Major depressive disorder, recurrent, in remission, unspecified: Secondary | ICD-10-CM | POA: Diagnosis not present

## 2020-05-11 DIAGNOSIS — E1151 Type 2 diabetes mellitus with diabetic peripheral angiopathy without gangrene: Secondary | ICD-10-CM | POA: Diagnosis not present

## 2020-05-11 DIAGNOSIS — J449 Chronic obstructive pulmonary disease, unspecified: Secondary | ICD-10-CM | POA: Diagnosis not present

## 2020-05-11 DIAGNOSIS — Z6834 Body mass index (BMI) 34.0-34.9, adult: Secondary | ICD-10-CM | POA: Diagnosis not present

## 2020-05-11 DIAGNOSIS — M461 Sacroiliitis, not elsewhere classified: Secondary | ICD-10-CM | POA: Diagnosis not present

## 2020-05-11 DIAGNOSIS — I70203 Unspecified atherosclerosis of native arteries of extremities, bilateral legs: Secondary | ICD-10-CM | POA: Diagnosis not present

## 2020-05-11 DIAGNOSIS — F330 Major depressive disorder, recurrent, mild: Secondary | ICD-10-CM | POA: Diagnosis not present

## 2020-05-11 DIAGNOSIS — D692 Other nonthrombocytopenic purpura: Secondary | ICD-10-CM | POA: Diagnosis not present

## 2020-05-11 DIAGNOSIS — I10 Essential (primary) hypertension: Secondary | ICD-10-CM | POA: Diagnosis not present

## 2020-05-11 DIAGNOSIS — G4700 Insomnia, unspecified: Secondary | ICD-10-CM | POA: Diagnosis not present

## 2020-05-11 DIAGNOSIS — I25118 Atherosclerotic heart disease of native coronary artery with other forms of angina pectoris: Secondary | ICD-10-CM | POA: Diagnosis not present

## 2020-05-18 DIAGNOSIS — J4520 Mild intermittent asthma, uncomplicated: Secondary | ICD-10-CM | POA: Diagnosis not present

## 2020-05-18 DIAGNOSIS — J310 Chronic rhinitis: Secondary | ICD-10-CM | POA: Diagnosis not present

## 2020-05-18 DIAGNOSIS — I10 Essential (primary) hypertension: Secondary | ICD-10-CM | POA: Diagnosis not present

## 2020-05-18 DIAGNOSIS — K449 Diaphragmatic hernia without obstruction or gangrene: Secondary | ICD-10-CM | POA: Diagnosis not present

## 2020-05-18 DIAGNOSIS — R0602 Shortness of breath: Secondary | ICD-10-CM | POA: Diagnosis not present

## 2020-05-18 DIAGNOSIS — G4733 Obstructive sleep apnea (adult) (pediatric): Secondary | ICD-10-CM | POA: Diagnosis not present

## 2020-05-26 DIAGNOSIS — M19012 Primary osteoarthritis, left shoulder: Secondary | ICD-10-CM | POA: Diagnosis not present

## 2020-06-21 DIAGNOSIS — C44311 Basal cell carcinoma of skin of nose: Secondary | ICD-10-CM | POA: Diagnosis not present

## 2020-06-29 DIAGNOSIS — D485 Neoplasm of uncertain behavior of skin: Secondary | ICD-10-CM | POA: Diagnosis not present

## 2020-06-29 DIAGNOSIS — C4441 Basal cell carcinoma of skin of scalp and neck: Secondary | ICD-10-CM | POA: Diagnosis not present

## 2020-06-29 DIAGNOSIS — L538 Other specified erythematous conditions: Secondary | ICD-10-CM | POA: Diagnosis not present

## 2020-06-29 DIAGNOSIS — L820 Inflamed seborrheic keratosis: Secondary | ICD-10-CM | POA: Diagnosis not present

## 2020-06-29 DIAGNOSIS — D2261 Melanocytic nevi of right upper limb, including shoulder: Secondary | ICD-10-CM | POA: Diagnosis not present

## 2020-06-29 DIAGNOSIS — X32XXXA Exposure to sunlight, initial encounter: Secondary | ICD-10-CM | POA: Diagnosis not present

## 2020-06-29 DIAGNOSIS — Z85828 Personal history of other malignant neoplasm of skin: Secondary | ICD-10-CM | POA: Diagnosis not present

## 2020-06-29 DIAGNOSIS — L570 Actinic keratosis: Secondary | ICD-10-CM | POA: Diagnosis not present

## 2020-06-29 DIAGNOSIS — D2271 Melanocytic nevi of right lower limb, including hip: Secondary | ICD-10-CM | POA: Diagnosis not present

## 2020-06-29 DIAGNOSIS — Z85820 Personal history of malignant melanoma of skin: Secondary | ICD-10-CM | POA: Diagnosis not present

## 2020-08-09 DIAGNOSIS — C4441 Basal cell carcinoma of skin of scalp and neck: Secondary | ICD-10-CM | POA: Diagnosis not present

## 2020-08-25 DIAGNOSIS — M7671 Peroneal tendinitis, right leg: Secondary | ICD-10-CM | POA: Diagnosis not present

## 2020-08-25 DIAGNOSIS — M7542 Impingement syndrome of left shoulder: Secondary | ICD-10-CM | POA: Diagnosis not present

## 2020-09-03 ENCOUNTER — Other Ambulatory Visit: Payer: Self-pay | Admitting: Family Medicine

## 2020-09-03 DIAGNOSIS — Z1231 Encounter for screening mammogram for malignant neoplasm of breast: Secondary | ICD-10-CM

## 2020-09-07 DIAGNOSIS — R350 Frequency of micturition: Secondary | ICD-10-CM | POA: Diagnosis not present

## 2020-09-09 ENCOUNTER — Other Ambulatory Visit: Payer: Self-pay | Admitting: Family Medicine

## 2020-09-09 DIAGNOSIS — Z1231 Encounter for screening mammogram for malignant neoplasm of breast: Secondary | ICD-10-CM

## 2020-09-13 DIAGNOSIS — M7542 Impingement syndrome of left shoulder: Secondary | ICD-10-CM | POA: Diagnosis not present

## 2020-09-13 DIAGNOSIS — M7671 Peroneal tendinitis, right leg: Secondary | ICD-10-CM | POA: Diagnosis not present

## 2020-09-17 ENCOUNTER — Ambulatory Visit
Admission: RE | Admit: 2020-09-17 | Discharge: 2020-09-17 | Disposition: A | Discharge status: Home / Self Care | Payer: PPO | Source: Ambulatory Visit | Attending: Family Medicine | Admitting: Family Medicine

## 2020-09-17 ENCOUNTER — Other Ambulatory Visit: Payer: Self-pay

## 2020-09-17 DIAGNOSIS — Z1231 Encounter for screening mammogram for malignant neoplasm of breast: Secondary | ICD-10-CM | POA: Insufficient documentation

## 2020-09-20 DIAGNOSIS — N390 Urinary tract infection, site not specified: Secondary | ICD-10-CM | POA: Diagnosis not present

## 2020-09-27 DIAGNOSIS — M810 Age-related osteoporosis without current pathological fracture: Secondary | ICD-10-CM | POA: Diagnosis not present

## 2020-09-27 DIAGNOSIS — E119 Type 2 diabetes mellitus without complications: Secondary | ICD-10-CM | POA: Diagnosis not present

## 2020-09-27 DIAGNOSIS — I10 Essential (primary) hypertension: Secondary | ICD-10-CM | POA: Diagnosis not present

## 2020-09-27 DIAGNOSIS — Z1331 Encounter for screening for depression: Secondary | ICD-10-CM | POA: Diagnosis not present

## 2020-09-27 DIAGNOSIS — Z794 Long term (current) use of insulin: Secondary | ICD-10-CM | POA: Diagnosis not present

## 2020-09-27 DIAGNOSIS — I25118 Atherosclerotic heart disease of native coronary artery with other forms of angina pectoris: Secondary | ICD-10-CM | POA: Diagnosis not present

## 2020-09-27 DIAGNOSIS — Z0000 Encounter for general adult medical examination without abnormal findings: Secondary | ICD-10-CM | POA: Diagnosis not present

## 2020-09-27 DIAGNOSIS — E782 Mixed hyperlipidemia: Secondary | ICD-10-CM | POA: Diagnosis not present

## 2020-09-30 DIAGNOSIS — J309 Allergic rhinitis, unspecified: Secondary | ICD-10-CM | POA: Diagnosis not present

## 2020-10-05 DIAGNOSIS — J309 Allergic rhinitis, unspecified: Secondary | ICD-10-CM | POA: Diagnosis not present

## 2020-10-05 DIAGNOSIS — R5383 Other fatigue: Secondary | ICD-10-CM | POA: Diagnosis not present

## 2020-10-18 DIAGNOSIS — E782 Mixed hyperlipidemia: Secondary | ICD-10-CM | POA: Diagnosis not present

## 2020-10-18 DIAGNOSIS — I10 Essential (primary) hypertension: Secondary | ICD-10-CM | POA: Diagnosis not present

## 2020-10-18 DIAGNOSIS — Z794 Long term (current) use of insulin: Secondary | ICD-10-CM | POA: Diagnosis not present

## 2020-10-18 DIAGNOSIS — E119 Type 2 diabetes mellitus without complications: Secondary | ICD-10-CM | POA: Diagnosis not present

## 2020-10-25 DIAGNOSIS — M7671 Peroneal tendinitis, right leg: Secondary | ICD-10-CM | POA: Diagnosis not present

## 2020-10-25 DIAGNOSIS — M545 Low back pain, unspecified: Secondary | ICD-10-CM | POA: Diagnosis not present

## 2020-10-25 DIAGNOSIS — M7542 Impingement syndrome of left shoulder: Secondary | ICD-10-CM | POA: Diagnosis not present

## 2020-10-28 DIAGNOSIS — L570 Actinic keratosis: Secondary | ICD-10-CM | POA: Diagnosis not present

## 2020-10-28 DIAGNOSIS — D485 Neoplasm of uncertain behavior of skin: Secondary | ICD-10-CM | POA: Diagnosis not present

## 2020-10-28 DIAGNOSIS — Z85828 Personal history of other malignant neoplasm of skin: Secondary | ICD-10-CM | POA: Diagnosis not present

## 2020-10-28 DIAGNOSIS — Z85820 Personal history of malignant melanoma of skin: Secondary | ICD-10-CM | POA: Diagnosis not present

## 2020-10-28 DIAGNOSIS — X32XXXA Exposure to sunlight, initial encounter: Secondary | ICD-10-CM | POA: Diagnosis not present

## 2020-11-02 DIAGNOSIS — L237 Allergic contact dermatitis due to plants, except food: Secondary | ICD-10-CM | POA: Diagnosis not present

## 2020-11-12 DIAGNOSIS — H353131 Nonexudative age-related macular degeneration, bilateral, early dry stage: Secondary | ICD-10-CM | POA: Diagnosis not present

## 2020-11-12 DIAGNOSIS — E119 Type 2 diabetes mellitus without complications: Secondary | ICD-10-CM | POA: Diagnosis not present

## 2020-11-19 DIAGNOSIS — J4520 Mild intermittent asthma, uncomplicated: Secondary | ICD-10-CM | POA: Diagnosis not present

## 2020-11-19 DIAGNOSIS — J310 Chronic rhinitis: Secondary | ICD-10-CM | POA: Diagnosis not present

## 2020-11-19 DIAGNOSIS — G4733 Obstructive sleep apnea (adult) (pediatric): Secondary | ICD-10-CM | POA: Diagnosis not present

## 2020-11-23 DIAGNOSIS — L237 Allergic contact dermatitis due to plants, except food: Secondary | ICD-10-CM | POA: Diagnosis not present

## 2020-11-30 DIAGNOSIS — M545 Low back pain, unspecified: Secondary | ICD-10-CM | POA: Diagnosis not present

## 2020-11-30 DIAGNOSIS — M7542 Impingement syndrome of left shoulder: Secondary | ICD-10-CM | POA: Diagnosis not present

## 2020-12-15 DIAGNOSIS — M25512 Pain in left shoulder: Secondary | ICD-10-CM | POA: Diagnosis not present

## 2020-12-16 DIAGNOSIS — D225 Melanocytic nevi of trunk: Secondary | ICD-10-CM | POA: Diagnosis not present

## 2020-12-16 DIAGNOSIS — D2271 Melanocytic nevi of right lower limb, including hip: Secondary | ICD-10-CM | POA: Diagnosis not present

## 2020-12-16 DIAGNOSIS — D2261 Melanocytic nevi of right upper limb, including shoulder: Secondary | ICD-10-CM | POA: Diagnosis not present

## 2020-12-16 DIAGNOSIS — Z85820 Personal history of malignant melanoma of skin: Secondary | ICD-10-CM | POA: Diagnosis not present

## 2020-12-16 DIAGNOSIS — Z85828 Personal history of other malignant neoplasm of skin: Secondary | ICD-10-CM | POA: Diagnosis not present

## 2020-12-16 DIAGNOSIS — L570 Actinic keratosis: Secondary | ICD-10-CM | POA: Diagnosis not present

## 2020-12-16 DIAGNOSIS — C44319 Basal cell carcinoma of skin of other parts of face: Secondary | ICD-10-CM | POA: Diagnosis not present

## 2020-12-16 DIAGNOSIS — X32XXXA Exposure to sunlight, initial encounter: Secondary | ICD-10-CM | POA: Diagnosis not present

## 2020-12-16 DIAGNOSIS — B078 Other viral warts: Secondary | ICD-10-CM | POA: Diagnosis not present

## 2020-12-16 DIAGNOSIS — D485 Neoplasm of uncertain behavior of skin: Secondary | ICD-10-CM | POA: Diagnosis not present

## 2020-12-30 ENCOUNTER — Other Ambulatory Visit: Payer: Self-pay | Admitting: Family Medicine

## 2020-12-30 DIAGNOSIS — N644 Mastodynia: Secondary | ICD-10-CM | POA: Diagnosis not present

## 2020-12-30 DIAGNOSIS — N6323 Unspecified lump in the left breast, lower outer quadrant: Secondary | ICD-10-CM

## 2020-12-30 DIAGNOSIS — Z853 Personal history of malignant neoplasm of breast: Secondary | ICD-10-CM | POA: Diagnosis not present

## 2020-12-31 ENCOUNTER — Other Ambulatory Visit: Payer: Self-pay | Admitting: Family Medicine

## 2020-12-31 DIAGNOSIS — N644 Mastodynia: Secondary | ICD-10-CM

## 2020-12-31 DIAGNOSIS — N6323 Unspecified lump in the left breast, lower outer quadrant: Secondary | ICD-10-CM

## 2021-01-20 DIAGNOSIS — C44319 Basal cell carcinoma of skin of other parts of face: Secondary | ICD-10-CM | POA: Diagnosis not present

## 2021-02-14 DIAGNOSIS — M19012 Primary osteoarthritis, left shoulder: Secondary | ICD-10-CM | POA: Diagnosis not present

## 2021-03-07 DIAGNOSIS — M19012 Primary osteoarthritis, left shoulder: Secondary | ICD-10-CM | POA: Diagnosis not present

## 2021-03-11 DIAGNOSIS — M19011 Primary osteoarthritis, right shoulder: Secondary | ICD-10-CM | POA: Diagnosis not present

## 2021-03-11 DIAGNOSIS — Z85118 Personal history of other malignant neoplasm of bronchus and lung: Secondary | ICD-10-CM | POA: Diagnosis not present

## 2021-03-11 DIAGNOSIS — Z923 Personal history of irradiation: Secondary | ICD-10-CM | POA: Diagnosis not present

## 2021-03-11 DIAGNOSIS — J449 Chronic obstructive pulmonary disease, unspecified: Secondary | ICD-10-CM | POA: Diagnosis not present

## 2021-03-11 DIAGNOSIS — Z9012 Acquired absence of left breast and nipple: Secondary | ICD-10-CM | POA: Diagnosis not present

## 2021-03-11 DIAGNOSIS — Z87891 Personal history of nicotine dependence: Secondary | ICD-10-CM | POA: Diagnosis not present

## 2021-03-11 DIAGNOSIS — Z853 Personal history of malignant neoplasm of breast: Secondary | ICD-10-CM | POA: Diagnosis not present

## 2021-03-11 DIAGNOSIS — E1159 Type 2 diabetes mellitus with other circulatory complications: Secondary | ICD-10-CM | POA: Diagnosis not present

## 2021-03-11 DIAGNOSIS — M19012 Primary osteoarthritis, left shoulder: Secondary | ICD-10-CM | POA: Diagnosis not present

## 2021-03-11 DIAGNOSIS — I10 Essential (primary) hypertension: Secondary | ICD-10-CM | POA: Diagnosis not present

## 2021-03-11 DIAGNOSIS — Z7984 Long term (current) use of oral hypoglycemic drugs: Secondary | ICD-10-CM | POA: Diagnosis not present

## 2021-03-11 DIAGNOSIS — Z85820 Personal history of malignant melanoma of skin: Secondary | ICD-10-CM | POA: Diagnosis not present

## 2021-04-08 DIAGNOSIS — Z03818 Encounter for observation for suspected exposure to other biological agents ruled out: Secondary | ICD-10-CM | POA: Diagnosis not present

## 2021-04-08 DIAGNOSIS — Z794 Long term (current) use of insulin: Secondary | ICD-10-CM | POA: Diagnosis not present

## 2021-04-08 DIAGNOSIS — I25118 Atherosclerotic heart disease of native coronary artery with other forms of angina pectoris: Secondary | ICD-10-CM | POA: Diagnosis not present

## 2021-04-08 DIAGNOSIS — E782 Mixed hyperlipidemia: Secondary | ICD-10-CM | POA: Diagnosis not present

## 2021-04-08 DIAGNOSIS — I10 Essential (primary) hypertension: Secondary | ICD-10-CM | POA: Diagnosis not present

## 2021-04-08 DIAGNOSIS — E119 Type 2 diabetes mellitus without complications: Secondary | ICD-10-CM | POA: Diagnosis not present

## 2021-04-08 DIAGNOSIS — R051 Acute cough: Secondary | ICD-10-CM | POA: Diagnosis not present

## 2021-04-29 DIAGNOSIS — M19012 Primary osteoarthritis, left shoulder: Secondary | ICD-10-CM | POA: Diagnosis not present

## 2021-05-06 DIAGNOSIS — Z01818 Encounter for other preprocedural examination: Secondary | ICD-10-CM | POA: Diagnosis not present

## 2021-05-06 DIAGNOSIS — I10 Essential (primary) hypertension: Secondary | ICD-10-CM | POA: Diagnosis not present

## 2021-05-06 DIAGNOSIS — I25118 Atherosclerotic heart disease of native coronary artery with other forms of angina pectoris: Secondary | ICD-10-CM | POA: Diagnosis not present

## 2021-05-13 DIAGNOSIS — J310 Chronic rhinitis: Secondary | ICD-10-CM | POA: Diagnosis not present

## 2021-05-13 DIAGNOSIS — J4520 Mild intermittent asthma, uncomplicated: Secondary | ICD-10-CM | POA: Diagnosis not present

## 2021-05-13 DIAGNOSIS — G4733 Obstructive sleep apnea (adult) (pediatric): Secondary | ICD-10-CM | POA: Diagnosis not present

## 2021-05-15 DIAGNOSIS — Z0000 Encounter for general adult medical examination without abnormal findings: Secondary | ICD-10-CM | POA: Insufficient documentation

## 2021-05-25 DIAGNOSIS — M25712 Osteophyte, left shoulder: Secondary | ICD-10-CM | POA: Diagnosis not present

## 2021-05-25 DIAGNOSIS — I2510 Atherosclerotic heart disease of native coronary artery without angina pectoris: Secondary | ICD-10-CM | POA: Diagnosis not present

## 2021-05-25 DIAGNOSIS — J449 Chronic obstructive pulmonary disease, unspecified: Secondary | ICD-10-CM | POA: Diagnosis not present

## 2021-05-25 DIAGNOSIS — M75102 Unspecified rotator cuff tear or rupture of left shoulder, not specified as traumatic: Secondary | ICD-10-CM | POA: Diagnosis not present

## 2021-05-25 DIAGNOSIS — Z85118 Personal history of other malignant neoplasm of bronchus and lung: Secondary | ICD-10-CM | POA: Diagnosis not present

## 2021-05-25 DIAGNOSIS — Z902 Acquired absence of lung [part of]: Secondary | ICD-10-CM | POA: Diagnosis not present

## 2021-05-25 DIAGNOSIS — R0603 Acute respiratory distress: Secondary | ICD-10-CM | POA: Diagnosis not present

## 2021-05-25 DIAGNOSIS — I10 Essential (primary) hypertension: Secondary | ICD-10-CM | POA: Diagnosis not present

## 2021-05-25 DIAGNOSIS — R0902 Hypoxemia: Secondary | ICD-10-CM | POA: Diagnosis not present

## 2021-05-25 DIAGNOSIS — G4733 Obstructive sleep apnea (adult) (pediatric): Secondary | ICD-10-CM | POA: Diagnosis not present

## 2021-05-25 DIAGNOSIS — M19012 Primary osteoarthritis, left shoulder: Secondary | ICD-10-CM | POA: Diagnosis not present

## 2021-05-25 DIAGNOSIS — Z7984 Long term (current) use of oral hypoglycemic drugs: Secondary | ICD-10-CM | POA: Diagnosis not present

## 2021-05-25 DIAGNOSIS — E119 Type 2 diabetes mellitus without complications: Secondary | ICD-10-CM | POA: Diagnosis not present

## 2021-05-27 DIAGNOSIS — Z96612 Presence of left artificial shoulder joint: Secondary | ICD-10-CM | POA: Diagnosis not present

## 2021-05-27 DIAGNOSIS — Z471 Aftercare following joint replacement surgery: Secondary | ICD-10-CM | POA: Diagnosis not present

## 2021-06-01 DIAGNOSIS — M25512 Pain in left shoulder: Secondary | ICD-10-CM | POA: Diagnosis not present

## 2021-06-01 DIAGNOSIS — Z96612 Presence of left artificial shoulder joint: Secondary | ICD-10-CM | POA: Diagnosis not present

## 2021-06-08 DIAGNOSIS — Z96612 Presence of left artificial shoulder joint: Secondary | ICD-10-CM | POA: Diagnosis not present

## 2021-06-08 DIAGNOSIS — M25512 Pain in left shoulder: Secondary | ICD-10-CM | POA: Diagnosis not present

## 2021-06-14 DIAGNOSIS — Z96612 Presence of left artificial shoulder joint: Secondary | ICD-10-CM | POA: Diagnosis not present

## 2021-06-14 DIAGNOSIS — M25512 Pain in left shoulder: Secondary | ICD-10-CM | POA: Diagnosis not present

## 2021-06-22 DIAGNOSIS — M25512 Pain in left shoulder: Secondary | ICD-10-CM | POA: Diagnosis not present

## 2021-06-22 DIAGNOSIS — Z96612 Presence of left artificial shoulder joint: Secondary | ICD-10-CM | POA: Diagnosis not present

## 2021-06-24 DIAGNOSIS — Z96612 Presence of left artificial shoulder joint: Secondary | ICD-10-CM | POA: Diagnosis not present

## 2021-06-24 DIAGNOSIS — M25512 Pain in left shoulder: Secondary | ICD-10-CM | POA: Diagnosis not present

## 2021-06-28 DIAGNOSIS — Z96612 Presence of left artificial shoulder joint: Secondary | ICD-10-CM | POA: Diagnosis not present

## 2021-06-28 DIAGNOSIS — M25512 Pain in left shoulder: Secondary | ICD-10-CM | POA: Diagnosis not present

## 2021-06-30 DIAGNOSIS — M25512 Pain in left shoulder: Secondary | ICD-10-CM | POA: Diagnosis not present

## 2021-06-30 DIAGNOSIS — Z96612 Presence of left artificial shoulder joint: Secondary | ICD-10-CM | POA: Diagnosis not present

## 2021-07-04 DIAGNOSIS — Z96612 Presence of left artificial shoulder joint: Secondary | ICD-10-CM | POA: Diagnosis not present

## 2021-07-04 DIAGNOSIS — M25512 Pain in left shoulder: Secondary | ICD-10-CM | POA: Insufficient documentation

## 2021-07-05 DIAGNOSIS — M25562 Pain in left knee: Secondary | ICD-10-CM | POA: Diagnosis not present

## 2021-07-05 DIAGNOSIS — Z96612 Presence of left artificial shoulder joint: Secondary | ICD-10-CM | POA: Diagnosis not present

## 2021-07-06 DIAGNOSIS — M25512 Pain in left shoulder: Secondary | ICD-10-CM | POA: Diagnosis not present

## 2021-07-06 DIAGNOSIS — Z96612 Presence of left artificial shoulder joint: Secondary | ICD-10-CM | POA: Diagnosis not present

## 2021-07-18 DIAGNOSIS — M25512 Pain in left shoulder: Secondary | ICD-10-CM | POA: Diagnosis not present

## 2021-07-18 DIAGNOSIS — Z96612 Presence of left artificial shoulder joint: Secondary | ICD-10-CM | POA: Diagnosis not present

## 2021-07-19 DIAGNOSIS — M545 Low back pain, unspecified: Secondary | ICD-10-CM | POA: Diagnosis not present

## 2021-07-19 DIAGNOSIS — M25552 Pain in left hip: Secondary | ICD-10-CM | POA: Diagnosis not present

## 2021-07-19 DIAGNOSIS — M5416 Radiculopathy, lumbar region: Secondary | ICD-10-CM | POA: Diagnosis not present

## 2021-07-22 DIAGNOSIS — M25512 Pain in left shoulder: Secondary | ICD-10-CM | POA: Diagnosis not present

## 2021-07-22 DIAGNOSIS — Z96612 Presence of left artificial shoulder joint: Secondary | ICD-10-CM | POA: Diagnosis not present

## 2021-07-27 DIAGNOSIS — M25512 Pain in left shoulder: Secondary | ICD-10-CM | POA: Diagnosis not present

## 2021-07-27 DIAGNOSIS — Z96612 Presence of left artificial shoulder joint: Secondary | ICD-10-CM | POA: Diagnosis not present

## 2021-07-29 DIAGNOSIS — M25512 Pain in left shoulder: Secondary | ICD-10-CM | POA: Diagnosis not present

## 2021-07-29 DIAGNOSIS — Z96612 Presence of left artificial shoulder joint: Secondary | ICD-10-CM | POA: Diagnosis not present

## 2021-08-01 DIAGNOSIS — Z96612 Presence of left artificial shoulder joint: Secondary | ICD-10-CM | POA: Diagnosis not present

## 2021-08-01 DIAGNOSIS — M25512 Pain in left shoulder: Secondary | ICD-10-CM | POA: Diagnosis not present

## 2021-08-02 ENCOUNTER — Other Ambulatory Visit: Payer: Self-pay | Admitting: Family Medicine

## 2021-08-02 DIAGNOSIS — Z1231 Encounter for screening mammogram for malignant neoplasm of breast: Secondary | ICD-10-CM

## 2021-08-03 DIAGNOSIS — M25512 Pain in left shoulder: Secondary | ICD-10-CM | POA: Diagnosis not present

## 2021-08-03 DIAGNOSIS — Z96612 Presence of left artificial shoulder joint: Secondary | ICD-10-CM | POA: Diagnosis not present

## 2021-08-16 DIAGNOSIS — M5416 Radiculopathy, lumbar region: Secondary | ICD-10-CM | POA: Diagnosis not present

## 2021-08-16 DIAGNOSIS — M25571 Pain in right ankle and joints of right foot: Secondary | ICD-10-CM | POA: Diagnosis not present

## 2021-08-16 DIAGNOSIS — Z96612 Presence of left artificial shoulder joint: Secondary | ICD-10-CM | POA: Diagnosis not present

## 2021-08-16 DIAGNOSIS — M545 Low back pain, unspecified: Secondary | ICD-10-CM | POA: Diagnosis not present

## 2021-08-21 NOTE — Progress Notes (Signed)
? ? ? ? ?MRN : [id number] ? ?Kathryn Hammond is a 81 y.o. (1940/12/24) female who presents with chief complaint of check carotid arteries. ? ?History of Present Illness:  ? ?The patient is seen for follow up evaluation of carotid stenosis. The carotid stenosis followed by ultrasound.  ?  ?The patient denies amaurosis fugax. There is no recent history of TIA symptoms or focal motor deficits. There is no prior documented CVA. ?  ?The patient is taking enteric-coated aspirin 81 mg daily. ?  ?There is no history of migraine headaches. There is no history of seizures. ?  ?The patient has a history of coronary artery disease, no recent episodes of angina or shortness of breath. ?The patient denies PAD or claudication symptoms. ?There is a history of hyperlipidemia which is being treated with a statin.   ?  ?Carotid Duplex done today shows 1 to 39% stenosis bilaterally.  No change compared to last study. ? ?No outpatient medications have been marked as taking for the 08/22/21 encounter (Appointment) with Delana Meyer, Dolores Lory, MD.  ? ? ?Past Medical History:  ?Diagnosis Date  ? Arthritis   ? Asthma   ? Breast cancer ([place]) 2010  ? left breast cancer-DCIS  ? CPAP (continuous positive airway pressure) dependence 15 years  ? Depression   ? Dermatitis   ? Diabetes ([place])   ? non-insulin dependent  ? Dyspnea   ? Emphysema   ? Fatty liver disease, nonalcoholic   ? GERD (gastroesophageal reflux disease)   ? History of hiatal hernia   ? Hyperlipidemia   ? Lung cancer ([place])   ? Melanoma ([place])   ? right temple  ? Neuromuscular disorder ([place])   ? neuropathy  ? Obesity, unspecified   ? Personal history of malignant neoplasm of breast   ? left breast DCIS  ? Personal history of malignant neoplasm of bronchus and lung   ? right lung cancer  ? Personal history of radiation therapy 2010  ? f/u left breast cancer   ? Personal history of tobacco use, presenting hazards to health   ? Reflux esophagitis   ? Screening for obesity   ? Seasonal  allergies   ? Sleep apnea   ? cpap  ? Ulcer   ? peptic  ? Unspecified essential hypertension   ? Varicose veins of lower extremities with other complications   ? ? ?Past Surgical History:  ?Procedure Laterality Date  ? ABDOMINAL HYSTERECTOMY  1972  ? BACK SURGERY  02/2018  ? [place]  ? BREAST BIOPSY Right 2010?  ? Dr. Jamal Collin, benign  ? BREAST BIOPSY Left 09/27/2017  ? Fibroadenomatoid changes associated coarse calcifications, focal columnar cell change w/ calcs  ? BREAST EXCISIONAL BIOPSY Left 2010  ? DCIS  ? BREAST LUMPECTOMY Left 2010  ? Radiation for DCIS  ? BREAST SURGERY Left 2010  ? mammosite balloon placement and removal  ? CATARACT EXTRACTION W/PHACO Right 09/12/2016  ? Procedure: CATARACT EXTRACTION PHACO AND INTRAOCULAR LENS PLACEMENT (IOC);  Surgeon: Birder Robson, MD;  Location: [facility] ORS;  Service: Ophthalmology;  Laterality: Right;  [place] 00:38.2 ?AP% 10.9 ?CDE 4.16 ?Fluid Pack lot # [id number]  ? CATARACT EXTRACTION W/PHACO Left 10/03/2016  ? Procedure: CATARACT EXTRACTION PHACO AND INTRAOCULAR LENS PLACEMENT (IOC);  Surgeon: Birder Robson, MD;  Location: [facility] ORS;  Service: Ophthalmology;  Laterality: Left;  [place] 00:37 ?AP% 20.6 ?CDE 7.69 ?Fluid pack lot # [id number]  ? COLONOSCOPY  2007 and 2012  ? Dr. Vira [facility]ar at [facility]; polyps  ?  COLONOSCOPY WITH PROPOFOL N/A 10/28/2015  ? Procedure: COLONOSCOPY WITH PROPOFOL;  Surgeon: Manya Silvas, MD;  [facility] ENDOSCOPY;  Service: Endoscopy;  Laterality: N/A;  ? ESOPHAGOGASTRODUODENOSCOPY (EGD) WITH PROPOFOL N/A 10/28/2015  ? Procedure: ESOPHAGOGASTRODUODENOSCOPY (EGD) WITH PROPOFOL;  Surgeon: Manya Silvas, MD;  Location: [facility] ENDOSCOPY;  Service: Endoscopy;  Laterality: N/A;  ? EYE SURGERY  1992  ? HERNIA REPAIR  1990  ? KNEE SURGERY  1999  ? knee replaced  ? KNEE SURGERY  1977  ? knees replaced  ? LEFT HEART CATH AND CORONARY ANGIOGRAPHY Left 03/05/2017  ? Procedure: LEFT HEART CATH AND CORONARY ANGIOGRAPHY;  Surgeon: Teodoro Spray, MD;  Location: [place] CV LAB;  Service: Cardiovascular;  Laterality: Left;  ? LOBECTOMY  1989  ? MELANOMA EXCISION  December 07, 2009  ? right temple  ? MOHS SURGERY  2016  ? [facility], nose  ? SALPINGOOPHORECTOMY  1972  ? VEIN SURGERY  September 2011  ? vein closure procedure left leg; RF ablation of GSV in both legs  ? ? ?Social History ?Social History  ? ?Tobacco Use  ? Smoking status: Former  ?  Years: 30.00  ?  Types: Cigarettes  ?  Quit date: 04/10/1988  ?  Years since quitting: 33.3  ? Smokeless tobacco: Never  ?Substance Use Topics  ? Alcohol use: Yes  ?  Alcohol/week: 0.0 standard drinks  ?  Comment: once in a while  ? Drug use: No  ? ? ?Family History ?Family History  ?Problem Relation Age of Onset  ? Ovarian cancer Mother   ? Breast cancer Neg Hx   ? ? ?Allergies  ?Allergen Reactions  ? Ranitidine Hcl Hives  ?  Other reaction(s): Unknown  ? Balsam [place]-Castor [Balsam [place]-Castor Oil] Other (See Comments)  ?  Unknown  ?[facility] to [facility] - positive patch test   ? Carafate [Sucralfate] Itching  ? Dmdm Hydantoin Other (See Comments)  ?  Unknown  ?[facility] to [facility] - positive patch tes  ? Lisinopril Swelling  ? Peanut-Containing Drug Products Swelling  ? Pepcid [Famotidine] Itching  ? Silver Other (See Comments)  ?  blisters  ? Tape Itching  ?  Redness   ? Zantac [Ranitidine Hcl] Hives  ? Xanax [Alprazolam] Rash  ? ? ? ?REVIEW OF SYSTEMS (Negative unless checked) ? ?Constitutional: [] Weight loss  [] Fever  [] Chills ?Cardiac: [] Chest pain   [] Chest pressure   [] Palpitations   [] Shortness of breath when laying flat   [] Shortness of breath with exertion. ?Vascular:  [x] Pain in legs with walking   [] Pain in legs at rest  [] History of DVT   [] Phlebitis   [] Swelling in legs   [] Varicose veins   [] Non-healing ulcers ?Pulmonary:   [] Uses home oxygen   [] Productive cough   [] Hemoptysis   [] Wheeze  [] COPD   [] Asthma ?Neurologic:  [] Dizziness   [] Seizures   [] History of stroke   [] History of TIA  [] Aphasia   [] Vissual changes   [] Weakness or  numbness in arm   [] Weakness or numbness in leg ?Musculoskeletal:   [] Joint swelling   [] Joint pain   [] Low back pain ?Hematologic:  [] Easy bruising  [] Easy bleeding   [] Hypercoagulable state   [] Anemic ?Gastrointestinal:  [] Diarrhea   [] Vomiting  [] Gastroesophageal reflux/heartburn   [] Difficulty swallowing. ?Genitourinary:  [] Chronic kidney disease   [] Difficult urination  [] Frequent urination   [] Blood in urine ?Skin:  [] Rashes   [] Ulcers  ?Psychological:  [] History of anxiety   []  History of  major depression. ? ?Physical Examination ? ?There were no vitals filed for this visit. ?There is no height or weight on file to calculate BMI. ?Gen: WD/WN, NAD ?Head: [place]/AT, No temporalis wasting.  ?Ear/Nose/Throat: Hearing grossly intact, nares w/o erythema or drainage ?Eyes: PER, EOMI, sclera nonicteric.  ?Neck: Supple, no masses.  No bruit or JVD.  ?Pulmonary:  Good air movement, no audible wheezing, no use of accessory muscles.  ?Cardiac: RRR, normal S1, S2, no Murmurs. ?Vascular:  carotid bruit noted ?Vessel Right Left  ?Radial Palpable Palpable  ?Carotid  Palpable  Palpable  ?Gastrointestinal: soft, non-distended. No guarding/no peritoneal signs.  ?Musculoskeletal: M/S 5/5 throughout.  No visible deformity.  ?Neurologic: CN 2-12 intact. Pain and light touch intact in extremities.  Symmetrical.  Speech is fluent. Motor exam as listed above. ?Psychiatric: Judgment intact, Mood & affect appropriate for pt's clinical situation. ?Dermatologic: No rashes or ulcers noted.  No changes consistent with cellulitis. ? ? ?CBC ?Lab Results  ?Component Value Date  ? WBC 5.6 12/13/2017  ? HGB 13.5 12/13/2017  ? HCT 38.8 12/13/2017  ? MCV 85.4 12/13/2017  ? PLT 203 12/13/2017  ? ? ?BMET ?   ?Component Value Date/Time  ? NA 135 12/13/2017 0759  ? NA 132 (L) 04/14/2013 2002  ? K 3.4 (L) 12/13/2017 0759  ? K 4.7 04/14/2013 2002  ? CL 97 (L) 12/13/2017 0759  ? CL 100 04/14/2013 2002  ? CO2 26 12/13/2017 0759  ? CO2 26 04/14/2013 2002  ?  GLUCOSE 144 (H) 12/13/2017 0759  ? GLUCOSE 131 (H) 04/14/2013 2002  ? BUN 16 12/13/2017 0759  ? BUN 23 (H) 04/14/2013 2002  ? CREATININE 0.91 12/13/2017 0759  ? CREATININE 0.63 04/14/2013 2002  ? CALCIUM 8.6

## 2021-08-22 ENCOUNTER — Encounter (INDEPENDENT_AMBULATORY_CARE_PROVIDER_SITE_OTHER): Payer: Self-pay | Admitting: Vascular Surgery

## 2021-08-22 ENCOUNTER — Ambulatory Visit (INDEPENDENT_AMBULATORY_CARE_PROVIDER_SITE_OTHER): Payer: PPO | Admitting: Vascular Surgery

## 2021-08-22 ENCOUNTER — Ambulatory Visit (INDEPENDENT_AMBULATORY_CARE_PROVIDER_SITE_OTHER): Payer: PPO

## 2021-08-22 VITALS — BP 150/98 | HR 63 | Resp 14 | Ht 60.0 in | Wt 185.0 lb

## 2021-08-22 DIAGNOSIS — J449 Chronic obstructive pulmonary disease, unspecified: Secondary | ICD-10-CM | POA: Insufficient documentation

## 2021-08-22 DIAGNOSIS — C3490 Malignant neoplasm of unspecified part of unspecified bronchus or lung: Secondary | ICD-10-CM | POA: Insufficient documentation

## 2021-08-22 DIAGNOSIS — E785 Hyperlipidemia, unspecified: Secondary | ICD-10-CM

## 2021-08-22 DIAGNOSIS — Z9889 Other specified postprocedural states: Secondary | ICD-10-CM | POA: Insufficient documentation

## 2021-08-22 DIAGNOSIS — I10 Essential (primary) hypertension: Secondary | ICD-10-CM | POA: Insufficient documentation

## 2021-08-22 DIAGNOSIS — M19012 Primary osteoarthritis, left shoulder: Secondary | ICD-10-CM | POA: Insufficient documentation

## 2021-08-22 DIAGNOSIS — E749 Disorder of carbohydrate metabolism, unspecified: Secondary | ICD-10-CM | POA: Insufficient documentation

## 2021-08-22 DIAGNOSIS — M5416 Radiculopathy, lumbar region: Secondary | ICD-10-CM | POA: Insufficient documentation

## 2021-08-22 DIAGNOSIS — I872 Venous insufficiency (chronic) (peripheral): Secondary | ICD-10-CM | POA: Diagnosis not present

## 2021-08-22 DIAGNOSIS — I6523 Occlusion and stenosis of bilateral carotid arteries: Secondary | ICD-10-CM | POA: Diagnosis not present

## 2021-08-22 DIAGNOSIS — C50919 Malignant neoplasm of unspecified site of unspecified female breast: Secondary | ICD-10-CM | POA: Insufficient documentation

## 2021-08-22 DIAGNOSIS — M549 Dorsalgia, unspecified: Secondary | ICD-10-CM | POA: Insufficient documentation

## 2021-08-24 DIAGNOSIS — Z96612 Presence of left artificial shoulder joint: Secondary | ICD-10-CM | POA: Diagnosis not present

## 2021-08-24 DIAGNOSIS — M25512 Pain in left shoulder: Secondary | ICD-10-CM | POA: Diagnosis not present

## 2021-08-26 DIAGNOSIS — E119 Type 2 diabetes mellitus without complications: Secondary | ICD-10-CM | POA: Diagnosis not present

## 2021-08-26 DIAGNOSIS — I10 Essential (primary) hypertension: Secondary | ICD-10-CM | POA: Diagnosis not present

## 2021-08-26 DIAGNOSIS — E782 Mixed hyperlipidemia: Secondary | ICD-10-CM | POA: Diagnosis not present

## 2021-08-26 DIAGNOSIS — R051 Acute cough: Secondary | ICD-10-CM | POA: Diagnosis not present

## 2021-08-26 DIAGNOSIS — Z794 Long term (current) use of insulin: Secondary | ICD-10-CM | POA: Diagnosis not present

## 2021-08-29 DIAGNOSIS — J441 Chronic obstructive pulmonary disease with (acute) exacerbation: Secondary | ICD-10-CM | POA: Diagnosis not present

## 2021-08-29 DIAGNOSIS — R059 Cough, unspecified: Secondary | ICD-10-CM | POA: Diagnosis not present

## 2021-08-29 DIAGNOSIS — R5383 Other fatigue: Secondary | ICD-10-CM | POA: Diagnosis not present

## 2021-09-01 DIAGNOSIS — J441 Chronic obstructive pulmonary disease with (acute) exacerbation: Secondary | ICD-10-CM | POA: Diagnosis not present

## 2021-09-01 DIAGNOSIS — G4733 Obstructive sleep apnea (adult) (pediatric): Secondary | ICD-10-CM | POA: Diagnosis not present

## 2021-09-02 DIAGNOSIS — E1165 Type 2 diabetes mellitus with hyperglycemia: Secondary | ICD-10-CM | POA: Diagnosis not present

## 2021-09-06 DIAGNOSIS — E119 Type 2 diabetes mellitus without complications: Secondary | ICD-10-CM | POA: Diagnosis not present

## 2021-09-06 DIAGNOSIS — Z794 Long term (current) use of insulin: Secondary | ICD-10-CM | POA: Diagnosis not present

## 2021-09-06 DIAGNOSIS — G5793 Unspecified mononeuropathy of bilateral lower limbs: Secondary | ICD-10-CM | POA: Diagnosis not present

## 2021-09-12 DIAGNOSIS — Z7984 Long term (current) use of oral hypoglycemic drugs: Secondary | ICD-10-CM | POA: Diagnosis not present

## 2021-09-12 DIAGNOSIS — E1165 Type 2 diabetes mellitus with hyperglycemia: Secondary | ICD-10-CM | POA: Diagnosis not present

## 2021-09-12 DIAGNOSIS — J441 Chronic obstructive pulmonary disease with (acute) exacerbation: Secondary | ICD-10-CM | POA: Diagnosis not present

## 2021-09-13 DIAGNOSIS — Z96612 Presence of left artificial shoulder joint: Secondary | ICD-10-CM | POA: Diagnosis not present

## 2021-09-13 DIAGNOSIS — M25512 Pain in left shoulder: Secondary | ICD-10-CM | POA: Diagnosis not present

## 2021-09-15 DIAGNOSIS — M25512 Pain in left shoulder: Secondary | ICD-10-CM | POA: Diagnosis not present

## 2021-09-15 DIAGNOSIS — Z96612 Presence of left artificial shoulder joint: Secondary | ICD-10-CM | POA: Diagnosis not present

## 2021-09-19 ENCOUNTER — Ambulatory Visit
Admission: RE | Admit: 2021-09-19 | Discharge: 2021-09-19 | Disposition: A | Discharge status: Home / Self Care | Payer: PPO | Source: Ambulatory Visit | Attending: Family Medicine | Admitting: Family Medicine

## 2021-09-19 DIAGNOSIS — Z1231 Encounter for screening mammogram for malignant neoplasm of breast: Secondary | ICD-10-CM | POA: Diagnosis not present

## 2021-09-20 DIAGNOSIS — M5416 Radiculopathy, lumbar region: Secondary | ICD-10-CM | POA: Diagnosis not present

## 2021-09-20 DIAGNOSIS — Z96612 Presence of left artificial shoulder joint: Secondary | ICD-10-CM | POA: Diagnosis not present

## 2021-09-20 DIAGNOSIS — M25512 Pain in left shoulder: Secondary | ICD-10-CM | POA: Diagnosis not present

## 2021-09-20 DIAGNOSIS — M545 Low back pain, unspecified: Secondary | ICD-10-CM | POA: Diagnosis not present

## 2021-09-22 DIAGNOSIS — M25512 Pain in left shoulder: Secondary | ICD-10-CM | POA: Diagnosis not present

## 2021-09-22 DIAGNOSIS — Z96612 Presence of left artificial shoulder joint: Secondary | ICD-10-CM | POA: Diagnosis not present

## 2021-10-06 DIAGNOSIS — M545 Low back pain, unspecified: Secondary | ICD-10-CM | POA: Diagnosis not present

## 2021-10-06 DIAGNOSIS — M5416 Radiculopathy, lumbar region: Secondary | ICD-10-CM | POA: Diagnosis not present

## 2021-10-10 DIAGNOSIS — I10 Essential (primary) hypertension: Secondary | ICD-10-CM | POA: Diagnosis not present

## 2021-10-10 DIAGNOSIS — Z794 Long term (current) use of insulin: Secondary | ICD-10-CM | POA: Diagnosis not present

## 2021-10-10 DIAGNOSIS — Z853 Personal history of malignant neoplasm of breast: Secondary | ICD-10-CM | POA: Diagnosis not present

## 2021-10-10 DIAGNOSIS — E119 Type 2 diabetes mellitus without complications: Secondary | ICD-10-CM | POA: Diagnosis not present

## 2021-10-10 DIAGNOSIS — I25118 Atherosclerotic heart disease of native coronary artery with other forms of angina pectoris: Secondary | ICD-10-CM | POA: Diagnosis not present

## 2021-10-10 DIAGNOSIS — E782 Mixed hyperlipidemia: Secondary | ICD-10-CM | POA: Diagnosis not present

## 2021-10-10 DIAGNOSIS — Z0000 Encounter for general adult medical examination without abnormal findings: Secondary | ICD-10-CM | POA: Diagnosis not present

## 2021-10-10 DIAGNOSIS — M810 Age-related osteoporosis without current pathological fracture: Secondary | ICD-10-CM | POA: Diagnosis not present

## 2021-10-12 DIAGNOSIS — M545 Low back pain, unspecified: Secondary | ICD-10-CM | POA: Diagnosis not present

## 2021-10-28 DIAGNOSIS — M96621 Fracture of humerus following insertion of orthopedic implant, joint prosthesis, or bone plate, right arm: Secondary | ICD-10-CM | POA: Diagnosis not present

## 2021-11-10 DIAGNOSIS — J4520 Mild intermittent asthma, uncomplicated: Secondary | ICD-10-CM | POA: Diagnosis not present

## 2021-11-10 DIAGNOSIS — G4733 Obstructive sleep apnea (adult) (pediatric): Secondary | ICD-10-CM | POA: Diagnosis not present

## 2021-11-10 DIAGNOSIS — M479 Spondylosis, unspecified: Secondary | ICD-10-CM | POA: Diagnosis not present

## 2021-11-10 DIAGNOSIS — M48061 Spinal stenosis, lumbar region without neurogenic claudication: Secondary | ICD-10-CM | POA: Diagnosis not present

## 2021-11-10 DIAGNOSIS — M791 Myalgia, unspecified site: Secondary | ICD-10-CM | POA: Diagnosis not present

## 2021-11-10 DIAGNOSIS — M545 Low back pain, unspecified: Secondary | ICD-10-CM | POA: Diagnosis not present

## 2021-11-10 DIAGNOSIS — J302 Other seasonal allergic rhinitis: Secondary | ICD-10-CM | POA: Diagnosis not present

## 2021-11-16 DIAGNOSIS — H353131 Nonexudative age-related macular degeneration, bilateral, early dry stage: Secondary | ICD-10-CM | POA: Diagnosis not present

## 2021-11-24 DIAGNOSIS — D2272 Melanocytic nevi of left lower limb, including hip: Secondary | ICD-10-CM | POA: Diagnosis not present

## 2021-11-24 DIAGNOSIS — L538 Other specified erythematous conditions: Secondary | ICD-10-CM | POA: Diagnosis not present

## 2021-11-24 DIAGNOSIS — D225 Melanocytic nevi of trunk: Secondary | ICD-10-CM | POA: Diagnosis not present

## 2021-11-24 DIAGNOSIS — M19071 Primary osteoarthritis, right ankle and foot: Secondary | ICD-10-CM | POA: Diagnosis not present

## 2021-11-24 DIAGNOSIS — L570 Actinic keratosis: Secondary | ICD-10-CM | POA: Diagnosis not present

## 2021-11-24 DIAGNOSIS — D2262 Melanocytic nevi of left upper limb, including shoulder: Secondary | ICD-10-CM | POA: Diagnosis not present

## 2021-11-24 DIAGNOSIS — L821 Other seborrheic keratosis: Secondary | ICD-10-CM | POA: Diagnosis not present

## 2021-11-24 DIAGNOSIS — L820 Inflamed seborrheic keratosis: Secondary | ICD-10-CM | POA: Diagnosis not present

## 2021-11-24 DIAGNOSIS — D2261 Melanocytic nevi of right upper limb, including shoulder: Secondary | ICD-10-CM | POA: Diagnosis not present

## 2021-11-25 DIAGNOSIS — M19071 Primary osteoarthritis, right ankle and foot: Secondary | ICD-10-CM | POA: Diagnosis not present

## 2021-12-06 DIAGNOSIS — M2012 Hallux valgus (acquired), left foot: Secondary | ICD-10-CM | POA: Diagnosis not present

## 2021-12-06 DIAGNOSIS — M2011 Hallux valgus (acquired), right foot: Secondary | ICD-10-CM | POA: Diagnosis not present

## 2021-12-06 DIAGNOSIS — M79672 Pain in left foot: Secondary | ICD-10-CM | POA: Diagnosis not present

## 2021-12-06 DIAGNOSIS — E119 Type 2 diabetes mellitus without complications: Secondary | ICD-10-CM | POA: Diagnosis not present

## 2021-12-06 DIAGNOSIS — B351 Tinea unguium: Secondary | ICD-10-CM | POA: Diagnosis not present

## 2021-12-06 DIAGNOSIS — M19071 Primary osteoarthritis, right ankle and foot: Secondary | ICD-10-CM | POA: Diagnosis not present

## 2021-12-06 DIAGNOSIS — L600 Ingrowing nail: Secondary | ICD-10-CM | POA: Diagnosis not present

## 2021-12-06 DIAGNOSIS — M79671 Pain in right foot: Secondary | ICD-10-CM | POA: Diagnosis not present

## 2021-12-07 DIAGNOSIS — M48061 Spinal stenosis, lumbar region without neurogenic claudication: Secondary | ICD-10-CM | POA: Diagnosis not present

## 2021-12-21 DIAGNOSIS — M5416 Radiculopathy, lumbar region: Secondary | ICD-10-CM | POA: Diagnosis not present

## 2021-12-21 DIAGNOSIS — E119 Type 2 diabetes mellitus without complications: Secondary | ICD-10-CM | POA: Diagnosis not present

## 2021-12-28 DIAGNOSIS — M461 Sacroiliitis, not elsewhere classified: Secondary | ICD-10-CM | POA: Diagnosis not present

## 2021-12-28 DIAGNOSIS — G619 Inflammatory polyneuropathy, unspecified: Secondary | ICD-10-CM | POA: Diagnosis not present

## 2021-12-28 DIAGNOSIS — I10 Essential (primary) hypertension: Secondary | ICD-10-CM | POA: Diagnosis not present

## 2021-12-28 DIAGNOSIS — F1320 Sedative, hypnotic or anxiolytic dependence, uncomplicated: Secondary | ICD-10-CM | POA: Diagnosis not present

## 2021-12-28 DIAGNOSIS — Z7951 Long term (current) use of inhaled steroids: Secondary | ICD-10-CM | POA: Diagnosis not present

## 2021-12-28 DIAGNOSIS — D692 Other nonthrombocytopenic purpura: Secondary | ICD-10-CM | POA: Diagnosis not present

## 2021-12-28 DIAGNOSIS — F339 Major depressive disorder, recurrent, unspecified: Secondary | ICD-10-CM | POA: Diagnosis not present

## 2021-12-28 DIAGNOSIS — I70203 Unspecified atherosclerosis of native arteries of extremities, bilateral legs: Secondary | ICD-10-CM | POA: Diagnosis not present

## 2021-12-28 DIAGNOSIS — E1151 Type 2 diabetes mellitus with diabetic peripheral angiopathy without gangrene: Secondary | ICD-10-CM | POA: Diagnosis not present

## 2021-12-28 DIAGNOSIS — J449 Chronic obstructive pulmonary disease, unspecified: Secondary | ICD-10-CM | POA: Diagnosis not present

## 2021-12-28 DIAGNOSIS — Z6831 Body mass index (BMI) 31.0-31.9, adult: Secondary | ICD-10-CM | POA: Diagnosis not present

## 2021-12-28 DIAGNOSIS — E1142 Type 2 diabetes mellitus with diabetic polyneuropathy: Secondary | ICD-10-CM | POA: Diagnosis not present

## 2022-01-11 DIAGNOSIS — E119 Type 2 diabetes mellitus without complications: Secondary | ICD-10-CM | POA: Diagnosis not present

## 2022-01-11 DIAGNOSIS — M47817 Spondylosis without myelopathy or radiculopathy, lumbosacral region: Secondary | ICD-10-CM | POA: Diagnosis not present

## 2022-01-16 DIAGNOSIS — T148XXA Other injury of unspecified body region, initial encounter: Secondary | ICD-10-CM | POA: Diagnosis not present

## 2022-01-16 DIAGNOSIS — I878 Other specified disorders of veins: Secondary | ICD-10-CM | POA: Diagnosis not present

## 2022-01-18 DIAGNOSIS — Z96612 Presence of left artificial shoulder joint: Secondary | ICD-10-CM | POA: Diagnosis not present

## 2022-01-18 DIAGNOSIS — S81801A Unspecified open wound, right lower leg, initial encounter: Secondary | ICD-10-CM | POA: Diagnosis not present

## 2022-02-20 DIAGNOSIS — S81801A Unspecified open wound, right lower leg, initial encounter: Secondary | ICD-10-CM | POA: Diagnosis not present

## 2022-02-20 DIAGNOSIS — Z96612 Presence of left artificial shoulder joint: Secondary | ICD-10-CM | POA: Diagnosis not present

## 2022-02-28 DIAGNOSIS — H0289 Other specified disorders of eyelid: Secondary | ICD-10-CM | POA: Diagnosis not present

## 2022-03-13 DIAGNOSIS — Z96612 Presence of left artificial shoulder joint: Secondary | ICD-10-CM | POA: Diagnosis not present

## 2022-03-16 DIAGNOSIS — M479 Spondylosis, unspecified: Secondary | ICD-10-CM | POA: Diagnosis not present

## 2022-03-16 DIAGNOSIS — Z5181 Encounter for therapeutic drug level monitoring: Secondary | ICD-10-CM | POA: Diagnosis not present

## 2022-03-16 DIAGNOSIS — Z79899 Other long term (current) drug therapy: Secondary | ICD-10-CM | POA: Diagnosis not present

## 2022-03-16 DIAGNOSIS — Z79891 Long term (current) use of opiate analgesic: Secondary | ICD-10-CM | POA: Diagnosis not present

## 2022-03-16 DIAGNOSIS — M5136 Other intervertebral disc degeneration, lumbar region: Secondary | ICD-10-CM | POA: Diagnosis not present

## 2022-03-16 DIAGNOSIS — M791 Myalgia, unspecified site: Secondary | ICD-10-CM | POA: Diagnosis not present

## 2022-03-16 DIAGNOSIS — M25512 Pain in left shoulder: Secondary | ICD-10-CM | POA: Diagnosis not present

## 2022-03-16 DIAGNOSIS — M545 Low back pain, unspecified: Secondary | ICD-10-CM | POA: Diagnosis not present

## 2022-03-16 DIAGNOSIS — M48061 Spinal stenosis, lumbar region without neurogenic claudication: Secondary | ICD-10-CM | POA: Diagnosis not present

## 2022-03-29 DIAGNOSIS — J069 Acute upper respiratory infection, unspecified: Secondary | ICD-10-CM | POA: Diagnosis not present

## 2022-04-04 DIAGNOSIS — J069 Acute upper respiratory infection, unspecified: Secondary | ICD-10-CM | POA: Diagnosis not present

## 2022-04-14 DIAGNOSIS — E782 Mixed hyperlipidemia: Secondary | ICD-10-CM | POA: Diagnosis not present

## 2022-04-14 DIAGNOSIS — Z794 Long term (current) use of insulin: Secondary | ICD-10-CM | POA: Diagnosis not present

## 2022-04-14 DIAGNOSIS — G5793 Unspecified mononeuropathy of bilateral lower limbs: Secondary | ICD-10-CM | POA: Diagnosis not present

## 2022-04-14 DIAGNOSIS — I25118 Atherosclerotic heart disease of native coronary artery with other forms of angina pectoris: Secondary | ICD-10-CM | POA: Diagnosis not present

## 2022-04-14 DIAGNOSIS — I10 Essential (primary) hypertension: Secondary | ICD-10-CM | POA: Diagnosis not present

## 2022-04-14 DIAGNOSIS — E119 Type 2 diabetes mellitus without complications: Secondary | ICD-10-CM | POA: Diagnosis not present

## 2022-04-19 DIAGNOSIS — M19071 Primary osteoarthritis, right ankle and foot: Secondary | ICD-10-CM | POA: Diagnosis not present

## 2022-05-01 DIAGNOSIS — H0289 Other specified disorders of eyelid: Secondary | ICD-10-CM | POA: Diagnosis not present

## 2022-05-10 DIAGNOSIS — J449 Chronic obstructive pulmonary disease, unspecified: Secondary | ICD-10-CM | POA: Diagnosis not present

## 2022-05-10 DIAGNOSIS — I25118 Atherosclerotic heart disease of native coronary artery with other forms of angina pectoris: Secondary | ICD-10-CM | POA: Diagnosis not present

## 2022-05-10 DIAGNOSIS — E782 Mixed hyperlipidemia: Secondary | ICD-10-CM | POA: Diagnosis not present

## 2022-05-10 DIAGNOSIS — E119 Type 2 diabetes mellitus without complications: Secondary | ICD-10-CM | POA: Diagnosis not present

## 2022-05-10 DIAGNOSIS — I10 Essential (primary) hypertension: Secondary | ICD-10-CM | POA: Diagnosis not present

## 2022-05-10 DIAGNOSIS — I872 Venous insufficiency (chronic) (peripheral): Secondary | ICD-10-CM | POA: Diagnosis not present

## 2022-05-10 DIAGNOSIS — G4733 Obstructive sleep apnea (adult) (pediatric): Secondary | ICD-10-CM | POA: Diagnosis not present

## 2022-05-10 DIAGNOSIS — Z794 Long term (current) use of insulin: Secondary | ICD-10-CM | POA: Diagnosis not present

## 2022-05-12 DIAGNOSIS — Z9889 Other specified postprocedural states: Secondary | ICD-10-CM | POA: Diagnosis not present

## 2022-05-12 DIAGNOSIS — Z96612 Presence of left artificial shoulder joint: Secondary | ICD-10-CM | POA: Diagnosis not present

## 2022-05-30 DIAGNOSIS — I10 Essential (primary) hypertension: Secondary | ICD-10-CM | POA: Diagnosis not present

## 2022-05-30 DIAGNOSIS — M542 Cervicalgia: Secondary | ICD-10-CM | POA: Diagnosis not present

## 2022-05-30 DIAGNOSIS — R42 Dizziness and giddiness: Secondary | ICD-10-CM | POA: Diagnosis not present

## 2022-06-06 DIAGNOSIS — D2271 Melanocytic nevi of right lower limb, including hip: Secondary | ICD-10-CM | POA: Diagnosis not present

## 2022-06-06 DIAGNOSIS — Z85828 Personal history of other malignant neoplasm of skin: Secondary | ICD-10-CM | POA: Diagnosis not present

## 2022-06-06 DIAGNOSIS — L820 Inflamed seborrheic keratosis: Secondary | ICD-10-CM | POA: Diagnosis not present

## 2022-06-06 DIAGNOSIS — D2261 Melanocytic nevi of right upper limb, including shoulder: Secondary | ICD-10-CM | POA: Diagnosis not present

## 2022-06-06 DIAGNOSIS — D2262 Melanocytic nevi of left upper limb, including shoulder: Secondary | ICD-10-CM | POA: Diagnosis not present

## 2022-06-06 DIAGNOSIS — D225 Melanocytic nevi of trunk: Secondary | ICD-10-CM | POA: Diagnosis not present

## 2022-06-06 DIAGNOSIS — D485 Neoplasm of uncertain behavior of skin: Secondary | ICD-10-CM | POA: Diagnosis not present

## 2022-06-06 DIAGNOSIS — D0439 Carcinoma in situ of skin of other parts of face: Secondary | ICD-10-CM | POA: Diagnosis not present

## 2022-06-06 DIAGNOSIS — L298 Other pruritus: Secondary | ICD-10-CM | POA: Diagnosis not present

## 2022-06-06 DIAGNOSIS — L538 Other specified erythematous conditions: Secondary | ICD-10-CM | POA: Diagnosis not present

## 2022-06-15 DIAGNOSIS — M545 Low back pain, unspecified: Secondary | ICD-10-CM | POA: Diagnosis not present

## 2022-06-15 DIAGNOSIS — M479 Spondylosis, unspecified: Secondary | ICD-10-CM | POA: Diagnosis not present

## 2022-06-15 DIAGNOSIS — M48061 Spinal stenosis, lumbar region without neurogenic claudication: Secondary | ICD-10-CM | POA: Diagnosis not present

## 2022-06-15 DIAGNOSIS — M791 Myalgia, unspecified site: Secondary | ICD-10-CM | POA: Diagnosis not present

## 2022-06-15 DIAGNOSIS — Z79899 Other long term (current) drug therapy: Secondary | ICD-10-CM | POA: Diagnosis not present

## 2022-06-15 DIAGNOSIS — M25512 Pain in left shoulder: Secondary | ICD-10-CM | POA: Diagnosis not present

## 2022-06-15 DIAGNOSIS — M5136 Other intervertebral disc degeneration, lumbar region: Secondary | ICD-10-CM | POA: Diagnosis not present

## 2022-06-26 DIAGNOSIS — E1151 Type 2 diabetes mellitus with diabetic peripheral angiopathy without gangrene: Secondary | ICD-10-CM | POA: Diagnosis not present

## 2022-06-26 DIAGNOSIS — Z7951 Long term (current) use of inhaled steroids: Secondary | ICD-10-CM | POA: Diagnosis not present

## 2022-06-26 DIAGNOSIS — D692 Other nonthrombocytopenic purpura: Secondary | ICD-10-CM | POA: Diagnosis not present

## 2022-06-26 DIAGNOSIS — Z7984 Long term (current) use of oral hypoglycemic drugs: Secondary | ICD-10-CM | POA: Diagnosis not present

## 2022-06-26 DIAGNOSIS — J449 Chronic obstructive pulmonary disease, unspecified: Secondary | ICD-10-CM | POA: Diagnosis not present

## 2022-06-30 DIAGNOSIS — M19071 Primary osteoarthritis, right ankle and foot: Secondary | ICD-10-CM | POA: Diagnosis not present

## 2022-07-03 DIAGNOSIS — L989 Disorder of the skin and subcutaneous tissue, unspecified: Secondary | ICD-10-CM | POA: Diagnosis not present

## 2022-07-07 DIAGNOSIS — L03115 Cellulitis of right lower limb: Secondary | ICD-10-CM | POA: Diagnosis not present

## 2022-07-13 DIAGNOSIS — J4520 Mild intermittent asthma, uncomplicated: Secondary | ICD-10-CM | POA: Diagnosis not present

## 2022-07-13 DIAGNOSIS — L03115 Cellulitis of right lower limb: Secondary | ICD-10-CM | POA: Diagnosis not present

## 2022-07-13 DIAGNOSIS — J301 Allergic rhinitis due to pollen: Secondary | ICD-10-CM | POA: Diagnosis not present

## 2022-07-13 DIAGNOSIS — G4733 Obstructive sleep apnea (adult) (pediatric): Secondary | ICD-10-CM | POA: Diagnosis not present

## 2022-07-13 DIAGNOSIS — S81801D Unspecified open wound, right lower leg, subsequent encounter: Secondary | ICD-10-CM | POA: Diagnosis not present

## 2022-07-14 DIAGNOSIS — E119 Type 2 diabetes mellitus without complications: Secondary | ICD-10-CM | POA: Diagnosis not present

## 2022-07-14 DIAGNOSIS — S81801A Unspecified open wound, right lower leg, initial encounter: Secondary | ICD-10-CM | POA: Diagnosis not present

## 2022-07-26 DIAGNOSIS — M67969 Unspecified disorder of synovium and tendon, unspecified lower leg: Secondary | ICD-10-CM | POA: Diagnosis not present

## 2022-07-26 DIAGNOSIS — M67961 Unspecified disorder of synovium and tendon, right lower leg: Secondary | ICD-10-CM | POA: Diagnosis not present

## 2022-07-27 DIAGNOSIS — S81801D Unspecified open wound, right lower leg, subsequent encounter: Secondary | ICD-10-CM | POA: Diagnosis not present

## 2022-07-31 DIAGNOSIS — M67961 Unspecified disorder of synovium and tendon, right lower leg: Secondary | ICD-10-CM | POA: Diagnosis not present

## 2022-07-31 DIAGNOSIS — M67969 Unspecified disorder of synovium and tendon, unspecified lower leg: Secondary | ICD-10-CM | POA: Diagnosis not present

## 2022-08-01 DIAGNOSIS — D0439 Carcinoma in situ of skin of other parts of face: Secondary | ICD-10-CM | POA: Diagnosis not present

## 2022-08-08 DIAGNOSIS — D045 Carcinoma in situ of skin of trunk: Secondary | ICD-10-CM | POA: Diagnosis not present

## 2022-08-10 DIAGNOSIS — M791 Myalgia, unspecified site: Secondary | ICD-10-CM | POA: Diagnosis not present

## 2022-08-10 DIAGNOSIS — Z79899 Other long term (current) drug therapy: Secondary | ICD-10-CM | POA: Diagnosis not present

## 2022-08-10 DIAGNOSIS — M479 Spondylosis, unspecified: Secondary | ICD-10-CM | POA: Diagnosis not present

## 2022-08-10 DIAGNOSIS — M48061 Spinal stenosis, lumbar region without neurogenic claudication: Secondary | ICD-10-CM | POA: Diagnosis not present

## 2022-08-10 DIAGNOSIS — M1990 Unspecified osteoarthritis, unspecified site: Secondary | ICD-10-CM | POA: Diagnosis not present

## 2022-08-10 DIAGNOSIS — M545 Low back pain, unspecified: Secondary | ICD-10-CM | POA: Diagnosis not present

## 2022-08-10 DIAGNOSIS — M25512 Pain in left shoulder: Secondary | ICD-10-CM | POA: Diagnosis not present

## 2022-08-10 DIAGNOSIS — I10 Essential (primary) hypertension: Secondary | ICD-10-CM | POA: Diagnosis not present

## 2022-08-10 DIAGNOSIS — M67969 Unspecified disorder of synovium and tendon, unspecified lower leg: Secondary | ICD-10-CM | POA: Diagnosis not present

## 2022-08-10 DIAGNOSIS — Z8739 Personal history of other diseases of the musculoskeletal system and connective tissue: Secondary | ICD-10-CM | POA: Diagnosis not present

## 2022-08-10 DIAGNOSIS — M5136 Other intervertebral disc degeneration, lumbar region: Secondary | ICD-10-CM | POA: Diagnosis not present

## 2022-08-14 ENCOUNTER — Other Ambulatory Visit: Payer: Self-pay | Admitting: Family Medicine

## 2022-08-14 DIAGNOSIS — Z1231 Encounter for screening mammogram for malignant neoplasm of breast: Secondary | ICD-10-CM

## 2022-08-21 DIAGNOSIS — M67961 Unspecified disorder of synovium and tendon, right lower leg: Secondary | ICD-10-CM | POA: Diagnosis not present

## 2022-08-21 DIAGNOSIS — M67969 Unspecified disorder of synovium and tendon, unspecified lower leg: Secondary | ICD-10-CM | POA: Diagnosis not present

## 2022-08-23 DIAGNOSIS — M67961 Unspecified disorder of synovium and tendon, right lower leg: Secondary | ICD-10-CM | POA: Diagnosis not present

## 2022-08-23 DIAGNOSIS — M67969 Unspecified disorder of synovium and tendon, unspecified lower leg: Secondary | ICD-10-CM | POA: Diagnosis not present

## 2022-08-23 DIAGNOSIS — M7671 Peroneal tendinitis, right leg: Secondary | ICD-10-CM | POA: Diagnosis not present

## 2022-08-28 DIAGNOSIS — F419 Anxiety disorder, unspecified: Secondary | ICD-10-CM | POA: Diagnosis not present

## 2022-08-30 DIAGNOSIS — M67969 Unspecified disorder of synovium and tendon, unspecified lower leg: Secondary | ICD-10-CM | POA: Diagnosis not present

## 2022-08-30 DIAGNOSIS — M67961 Unspecified disorder of synovium and tendon, right lower leg: Secondary | ICD-10-CM | POA: Diagnosis not present

## 2022-09-06 DIAGNOSIS — M67961 Unspecified disorder of synovium and tendon, right lower leg: Secondary | ICD-10-CM | POA: Diagnosis not present

## 2022-09-06 DIAGNOSIS — M67969 Unspecified disorder of synovium and tendon, unspecified lower leg: Secondary | ICD-10-CM | POA: Diagnosis not present

## 2022-09-14 ENCOUNTER — Ambulatory Visit: Payer: PPO

## 2022-09-14 ENCOUNTER — Other Ambulatory Visit: Payer: Self-pay | Admitting: Physician Assistant

## 2022-09-14 ENCOUNTER — Ambulatory Visit
Admission: RE | Admit: 2022-09-14 | Discharge: 2022-09-14 | Disposition: A | Discharge status: Home / Self Care | Payer: PPO | Source: Ambulatory Visit | Attending: Physician Assistant | Admitting: Physician Assistant

## 2022-09-14 DIAGNOSIS — R2241 Localized swelling, mass and lump, right lower limb: Secondary | ICD-10-CM

## 2022-09-14 DIAGNOSIS — R600 Localized edema: Secondary | ICD-10-CM | POA: Diagnosis not present

## 2022-09-19 DIAGNOSIS — E782 Mixed hyperlipidemia: Secondary | ICD-10-CM | POA: Diagnosis not present

## 2022-09-19 DIAGNOSIS — G5793 Unspecified mononeuropathy of bilateral lower limbs: Secondary | ICD-10-CM | POA: Diagnosis not present

## 2022-09-19 DIAGNOSIS — E538 Deficiency of other specified B group vitamins: Secondary | ICD-10-CM | POA: Diagnosis not present

## 2022-09-19 DIAGNOSIS — R609 Edema, unspecified: Secondary | ICD-10-CM | POA: Diagnosis not present

## 2022-09-19 DIAGNOSIS — E119 Type 2 diabetes mellitus without complications: Secondary | ICD-10-CM | POA: Diagnosis not present

## 2022-09-21 ENCOUNTER — Ambulatory Visit
Admission: RE | Admit: 2022-09-21 | Discharge: 2022-09-21 | Disposition: A | Discharge status: Home / Self Care | Payer: PPO | Source: Ambulatory Visit | Attending: Family Medicine | Admitting: Family Medicine

## 2022-09-21 DIAGNOSIS — Z1231 Encounter for screening mammogram for malignant neoplasm of breast: Secondary | ICD-10-CM | POA: Insufficient documentation

## 2022-09-25 DIAGNOSIS — E1142 Type 2 diabetes mellitus with diabetic polyneuropathy: Secondary | ICD-10-CM | POA: Diagnosis not present

## 2022-09-25 DIAGNOSIS — F419 Anxiety disorder, unspecified: Secondary | ICD-10-CM | POA: Diagnosis not present

## 2022-09-25 DIAGNOSIS — D692 Other nonthrombocytopenic purpura: Secondary | ICD-10-CM | POA: Diagnosis not present

## 2022-09-25 DIAGNOSIS — Z7984 Long term (current) use of oral hypoglycemic drugs: Secondary | ICD-10-CM | POA: Diagnosis not present

## 2022-09-25 DIAGNOSIS — M479 Spondylosis, unspecified: Secondary | ICD-10-CM | POA: Diagnosis not present

## 2022-09-25 DIAGNOSIS — I10 Essential (primary) hypertension: Secondary | ICD-10-CM | POA: Diagnosis not present

## 2022-09-25 DIAGNOSIS — Z6832 Body mass index (BMI) 32.0-32.9, adult: Secondary | ICD-10-CM | POA: Diagnosis not present

## 2022-09-28 DIAGNOSIS — M545 Low back pain, unspecified: Secondary | ICD-10-CM | POA: Diagnosis not present

## 2022-09-28 DIAGNOSIS — M25512 Pain in left shoulder: Secondary | ICD-10-CM | POA: Diagnosis not present

## 2022-09-28 DIAGNOSIS — M67969 Unspecified disorder of synovium and tendon, unspecified lower leg: Secondary | ICD-10-CM | POA: Diagnosis not present

## 2022-09-28 DIAGNOSIS — M479 Spondylosis, unspecified: Secondary | ICD-10-CM | POA: Diagnosis not present

## 2022-09-28 DIAGNOSIS — M791 Myalgia, unspecified site: Secondary | ICD-10-CM | POA: Diagnosis not present

## 2022-09-28 DIAGNOSIS — M5136 Other intervertebral disc degeneration, lumbar region: Secondary | ICD-10-CM | POA: Diagnosis not present

## 2022-09-28 DIAGNOSIS — Z79899 Other long term (current) drug therapy: Secondary | ICD-10-CM | POA: Diagnosis not present

## 2022-09-28 DIAGNOSIS — M48061 Spinal stenosis, lumbar region without neurogenic claudication: Secondary | ICD-10-CM | POA: Diagnosis not present

## 2022-10-02 DIAGNOSIS — N289 Disorder of kidney and ureter, unspecified: Secondary | ICD-10-CM | POA: Diagnosis not present

## 2022-10-17 DIAGNOSIS — M810 Age-related osteoporosis without current pathological fracture: Secondary | ICD-10-CM | POA: Diagnosis not present

## 2022-10-17 DIAGNOSIS — Z853 Personal history of malignant neoplasm of breast: Secondary | ICD-10-CM | POA: Diagnosis not present

## 2022-10-17 DIAGNOSIS — I10 Essential (primary) hypertension: Secondary | ICD-10-CM | POA: Diagnosis not present

## 2022-10-17 DIAGNOSIS — Z0000 Encounter for general adult medical examination without abnormal findings: Secondary | ICD-10-CM | POA: Diagnosis not present

## 2022-10-17 DIAGNOSIS — E119 Type 2 diabetes mellitus without complications: Secondary | ICD-10-CM | POA: Diagnosis not present

## 2022-10-17 DIAGNOSIS — I25118 Atherosclerotic heart disease of native coronary artery with other forms of angina pectoris: Secondary | ICD-10-CM | POA: Diagnosis not present

## 2022-10-17 DIAGNOSIS — Z1331 Encounter for screening for depression: Secondary | ICD-10-CM | POA: Diagnosis not present

## 2022-10-17 DIAGNOSIS — C3490 Malignant neoplasm of unspecified part of unspecified bronchus or lung: Secondary | ICD-10-CM | POA: Diagnosis not present

## 2022-10-17 DIAGNOSIS — G5793 Unspecified mononeuropathy of bilateral lower limbs: Secondary | ICD-10-CM | POA: Diagnosis not present

## 2022-10-25 DIAGNOSIS — M8588 Other specified disorders of bone density and structure, other site: Secondary | ICD-10-CM | POA: Diagnosis not present

## 2022-10-26 DIAGNOSIS — R2241 Localized swelling, mass and lump, right lower limb: Secondary | ICD-10-CM | POA: Diagnosis not present

## 2022-10-26 DIAGNOSIS — M25871 Other specified joint disorders, right ankle and foot: Secondary | ICD-10-CM | POA: Diagnosis not present

## 2022-10-30 DIAGNOSIS — M25571 Pain in right ankle and joints of right foot: Secondary | ICD-10-CM | POA: Diagnosis not present

## 2022-11-20 DIAGNOSIS — E119 Type 2 diabetes mellitus without complications: Secondary | ICD-10-CM | POA: Diagnosis not present

## 2022-11-20 DIAGNOSIS — M3501 Sicca syndrome with keratoconjunctivitis: Secondary | ICD-10-CM | POA: Diagnosis not present

## 2022-11-20 DIAGNOSIS — H353131 Nonexudative age-related macular degeneration, bilateral, early dry stage: Secondary | ICD-10-CM | POA: Diagnosis not present

## 2022-11-20 DIAGNOSIS — H43813 Vitreous degeneration, bilateral: Secondary | ICD-10-CM | POA: Diagnosis not present

## 2022-11-30 DIAGNOSIS — M25571 Pain in right ankle and joints of right foot: Secondary | ICD-10-CM | POA: Diagnosis not present

## 2022-12-14 DIAGNOSIS — I10 Essential (primary) hypertension: Secondary | ICD-10-CM | POA: Diagnosis not present

## 2022-12-14 DIAGNOSIS — E119 Type 2 diabetes mellitus without complications: Secondary | ICD-10-CM | POA: Diagnosis not present

## 2022-12-14 DIAGNOSIS — M545 Low back pain, unspecified: Secondary | ICD-10-CM | POA: Diagnosis not present

## 2022-12-14 DIAGNOSIS — R609 Edema, unspecified: Secondary | ICD-10-CM | POA: Diagnosis not present

## 2022-12-14 DIAGNOSIS — M25512 Pain in left shoulder: Secondary | ICD-10-CM | POA: Diagnosis not present

## 2022-12-14 DIAGNOSIS — M5136 Other intervertebral disc degeneration, lumbar region: Secondary | ICD-10-CM | POA: Diagnosis not present

## 2022-12-14 DIAGNOSIS — M479 Spondylosis, unspecified: Secondary | ICD-10-CM | POA: Diagnosis not present

## 2022-12-14 DIAGNOSIS — M48061 Spinal stenosis, lumbar region without neurogenic claudication: Secondary | ICD-10-CM | POA: Diagnosis not present

## 2022-12-14 DIAGNOSIS — M791 Myalgia, unspecified site: Secondary | ICD-10-CM | POA: Diagnosis not present

## 2022-12-14 DIAGNOSIS — I25118 Atherosclerotic heart disease of native coronary artery with other forms of angina pectoris: Secondary | ICD-10-CM | POA: Diagnosis not present

## 2023-01-02 DIAGNOSIS — L820 Inflamed seborrheic keratosis: Secondary | ICD-10-CM | POA: Diagnosis not present

## 2023-01-02 DIAGNOSIS — L570 Actinic keratosis: Secondary | ICD-10-CM | POA: Diagnosis not present

## 2023-01-02 DIAGNOSIS — D225 Melanocytic nevi of trunk: Secondary | ICD-10-CM | POA: Diagnosis not present

## 2023-01-02 DIAGNOSIS — C44319 Basal cell carcinoma of skin of other parts of face: Secondary | ICD-10-CM | POA: Diagnosis not present

## 2023-01-02 DIAGNOSIS — L538 Other specified erythematous conditions: Secondary | ICD-10-CM | POA: Diagnosis not present

## 2023-01-02 DIAGNOSIS — Z85828 Personal history of other malignant neoplasm of skin: Secondary | ICD-10-CM | POA: Diagnosis not present

## 2023-01-02 DIAGNOSIS — D2261 Melanocytic nevi of right upper limb, including shoulder: Secondary | ICD-10-CM | POA: Diagnosis not present

## 2023-01-02 DIAGNOSIS — D485 Neoplasm of uncertain behavior of skin: Secondary | ICD-10-CM | POA: Diagnosis not present

## 2023-01-02 DIAGNOSIS — X32XXXA Exposure to sunlight, initial encounter: Secondary | ICD-10-CM | POA: Diagnosis not present

## 2023-01-02 DIAGNOSIS — H61001 Unspecified perichondritis of right external ear: Secondary | ICD-10-CM | POA: Diagnosis not present

## 2023-01-02 DIAGNOSIS — L821 Other seborrheic keratosis: Secondary | ICD-10-CM | POA: Diagnosis not present

## 2023-01-02 DIAGNOSIS — D2262 Melanocytic nevi of left upper limb, including shoulder: Secondary | ICD-10-CM | POA: Diagnosis not present

## 2023-01-02 DIAGNOSIS — Z85820 Personal history of malignant melanoma of skin: Secondary | ICD-10-CM | POA: Diagnosis not present

## 2023-01-02 DIAGNOSIS — D0439 Carcinoma in situ of skin of other parts of face: Secondary | ICD-10-CM | POA: Diagnosis not present

## 2023-01-08 DIAGNOSIS — M19071 Primary osteoarthritis, right ankle and foot: Secondary | ICD-10-CM | POA: Diagnosis not present

## 2023-01-17 DIAGNOSIS — M47817 Spondylosis without myelopathy or radiculopathy, lumbosacral region: Secondary | ICD-10-CM | POA: Diagnosis not present

## 2023-02-08 DIAGNOSIS — N644 Mastodynia: Secondary | ICD-10-CM | POA: Diagnosis not present

## 2023-02-12 ENCOUNTER — Other Ambulatory Visit: Payer: Self-pay | Admitting: Family Medicine

## 2023-02-12 DIAGNOSIS — N644 Mastodynia: Secondary | ICD-10-CM

## 2023-02-13 DIAGNOSIS — C44319 Basal cell carcinoma of skin of other parts of face: Secondary | ICD-10-CM | POA: Diagnosis not present

## 2023-02-14 ENCOUNTER — Ambulatory Visit
Admission: RE | Admit: 2023-02-14 | Discharge: 2023-02-14 | Disposition: A | Discharge status: Home / Self Care | Payer: PPO | Source: Ambulatory Visit | Attending: Family Medicine | Admitting: Family Medicine

## 2023-02-14 DIAGNOSIS — N644 Mastodynia: Secondary | ICD-10-CM

## 2023-02-14 DIAGNOSIS — R92321 Mammographic fibroglandular density, right breast: Secondary | ICD-10-CM | POA: Diagnosis not present

## 2023-02-20 DIAGNOSIS — C44319 Basal cell carcinoma of skin of other parts of face: Secondary | ICD-10-CM | POA: Diagnosis not present

## 2023-02-27 DIAGNOSIS — D0439 Carcinoma in situ of skin of other parts of face: Secondary | ICD-10-CM | POA: Diagnosis not present

## 2023-03-06 DIAGNOSIS — M21621 Bunionette of right foot: Secondary | ICD-10-CM | POA: Diagnosis not present

## 2023-03-16 DIAGNOSIS — Z6832 Body mass index (BMI) 32.0-32.9, adult: Secondary | ICD-10-CM | POA: Diagnosis not present

## 2023-03-16 DIAGNOSIS — E1142 Type 2 diabetes mellitus with diabetic polyneuropathy: Secondary | ICD-10-CM | POA: Diagnosis not present

## 2023-03-16 DIAGNOSIS — Z7984 Long term (current) use of oral hypoglycemic drugs: Secondary | ICD-10-CM | POA: Diagnosis not present

## 2023-03-16 DIAGNOSIS — D692 Other nonthrombocytopenic purpura: Secondary | ICD-10-CM | POA: Diagnosis not present

## 2023-03-16 DIAGNOSIS — Z85828 Personal history of other malignant neoplasm of skin: Secondary | ICD-10-CM | POA: Diagnosis not present

## 2023-03-16 DIAGNOSIS — M19071 Primary osteoarthritis, right ankle and foot: Secondary | ICD-10-CM | POA: Diagnosis not present

## 2023-03-22 DIAGNOSIS — M51369 Other intervertebral disc degeneration, lumbar region without mention of lumbar back pain or lower extremity pain: Secondary | ICD-10-CM | POA: Diagnosis not present

## 2023-03-22 DIAGNOSIS — M479 Spondylosis, unspecified: Secondary | ICD-10-CM | POA: Diagnosis not present

## 2023-03-22 DIAGNOSIS — M48061 Spinal stenosis, lumbar region without neurogenic claudication: Secondary | ICD-10-CM | POA: Diagnosis not present

## 2023-03-22 DIAGNOSIS — M791 Myalgia, unspecified site: Secondary | ICD-10-CM | POA: Diagnosis not present

## 2023-03-22 DIAGNOSIS — M25512 Pain in left shoulder: Secondary | ICD-10-CM | POA: Diagnosis not present

## 2023-04-24 DIAGNOSIS — N289 Disorder of kidney and ureter, unspecified: Secondary | ICD-10-CM | POA: Diagnosis not present

## 2023-04-24 DIAGNOSIS — J449 Chronic obstructive pulmonary disease, unspecified: Secondary | ICD-10-CM | POA: Diagnosis not present

## 2023-04-24 DIAGNOSIS — E538 Deficiency of other specified B group vitamins: Secondary | ICD-10-CM | POA: Diagnosis not present

## 2023-04-24 DIAGNOSIS — E119 Type 2 diabetes mellitus without complications: Secondary | ICD-10-CM | POA: Diagnosis not present

## 2023-04-24 DIAGNOSIS — I10 Essential (primary) hypertension: Secondary | ICD-10-CM | POA: Diagnosis not present

## 2023-04-24 DIAGNOSIS — E782 Mixed hyperlipidemia: Secondary | ICD-10-CM | POA: Diagnosis not present

## 2023-04-24 DIAGNOSIS — C3490 Malignant neoplasm of unspecified part of unspecified bronchus or lung: Secondary | ICD-10-CM | POA: Diagnosis not present

## 2023-04-24 DIAGNOSIS — G5793 Unspecified mononeuropathy of bilateral lower limbs: Secondary | ICD-10-CM | POA: Diagnosis not present

## 2023-06-28 DIAGNOSIS — R109 Unspecified abdominal pain: Secondary | ICD-10-CM | POA: Diagnosis not present

## 2023-07-03 DIAGNOSIS — R109 Unspecified abdominal pain: Secondary | ICD-10-CM | POA: Diagnosis not present

## 2023-07-05 DIAGNOSIS — M19071 Primary osteoarthritis, right ankle and foot: Secondary | ICD-10-CM | POA: Diagnosis not present

## 2023-07-23 DIAGNOSIS — Z85828 Personal history of other malignant neoplasm of skin: Secondary | ICD-10-CM | POA: Diagnosis not present

## 2023-07-23 DIAGNOSIS — L820 Inflamed seborrheic keratosis: Secondary | ICD-10-CM | POA: Diagnosis not present

## 2023-07-23 DIAGNOSIS — D2261 Melanocytic nevi of right upper limb, including shoulder: Secondary | ICD-10-CM | POA: Diagnosis not present

## 2023-07-23 DIAGNOSIS — Z85820 Personal history of malignant melanoma of skin: Secondary | ICD-10-CM | POA: Diagnosis not present

## 2023-07-23 DIAGNOSIS — D2272 Melanocytic nevi of left lower limb, including hip: Secondary | ICD-10-CM | POA: Diagnosis not present

## 2023-07-23 DIAGNOSIS — L538 Other specified erythematous conditions: Secondary | ICD-10-CM | POA: Diagnosis not present

## 2023-07-23 DIAGNOSIS — D225 Melanocytic nevi of trunk: Secondary | ICD-10-CM | POA: Diagnosis not present

## 2023-07-23 DIAGNOSIS — D2262 Melanocytic nevi of left upper limb, including shoulder: Secondary | ICD-10-CM | POA: Diagnosis not present

## 2023-07-26 DIAGNOSIS — M48061 Spinal stenosis, lumbar region without neurogenic claudication: Secondary | ICD-10-CM | POA: Diagnosis not present

## 2023-07-26 DIAGNOSIS — M791 Myalgia, unspecified site: Secondary | ICD-10-CM | POA: Diagnosis not present

## 2023-07-26 DIAGNOSIS — M51369 Other intervertebral disc degeneration, lumbar region without mention of lumbar back pain or lower extremity pain: Secondary | ICD-10-CM | POA: Diagnosis not present

## 2023-07-26 DIAGNOSIS — M25512 Pain in left shoulder: Secondary | ICD-10-CM | POA: Diagnosis not present

## 2023-08-06 DIAGNOSIS — G3184 Mild cognitive impairment, so stated: Secondary | ICD-10-CM | POA: Diagnosis not present

## 2023-08-06 DIAGNOSIS — W19XXXS Unspecified fall, sequela: Secondary | ICD-10-CM | POA: Diagnosis not present

## 2023-08-06 DIAGNOSIS — Y92009 Unspecified place in unspecified non-institutional (private) residence as the place of occurrence of the external cause: Secondary | ICD-10-CM | POA: Diagnosis not present

## 2023-08-07 ENCOUNTER — Other Ambulatory Visit: Payer: Self-pay | Admitting: Physician Assistant

## 2023-08-07 DIAGNOSIS — R413 Other amnesia: Secondary | ICD-10-CM

## 2023-08-07 DIAGNOSIS — R29818 Other symptoms and signs involving the nervous system: Secondary | ICD-10-CM

## 2023-08-07 DIAGNOSIS — R296 Repeated falls: Secondary | ICD-10-CM

## 2023-08-08 ENCOUNTER — Ambulatory Visit
Admission: RE | Admit: 2023-08-08 | Discharge: 2023-08-08 | Disposition: A | Discharge status: Home / Self Care | Source: Ambulatory Visit | Attending: Physician Assistant | Admitting: Physician Assistant

## 2023-08-08 DIAGNOSIS — R296 Repeated falls: Secondary | ICD-10-CM | POA: Insufficient documentation

## 2023-08-08 DIAGNOSIS — I6782 Cerebral ischemia: Secondary | ICD-10-CM | POA: Diagnosis not present

## 2023-08-08 DIAGNOSIS — R413 Other amnesia: Secondary | ICD-10-CM | POA: Diagnosis not present

## 2023-08-08 DIAGNOSIS — R29818 Other symptoms and signs involving the nervous system: Secondary | ICD-10-CM | POA: Diagnosis not present

## 2023-08-21 NOTE — Progress Notes (Deleted)
 MRN : [id number]  Kathryn Hammond is a 83 y.o. (1941/02/19) female who presents with chief complaint of check carotid arteries.  History of Present Illness:   The patient is seen for follow up evaluation of carotid stenosis. The carotid stenosis followed by ultrasound.    The patient denies amaurosis fugax. There is no recent history of TIA symptoms or focal motor deficits. There is no prior documented CVA.   The patient is taking enteric-coated aspirin  81 mg daily.   There is no history of migraine headaches. There is no history of seizures.   The patient has a history of coronary artery disease, no recent episodes of angina or shortness of breath. The patient denies PAD or claudication symptoms. There is a history of hyperlipidemia which is being treated with a statin.     Carotid Duplex done today shows 1 to 39% stenosis bilaterally.  No change compared to last study.  No outpatient medications have been marked as taking for the 08/23/23 encounter (Appointment) with Prescilla Brod, Ninette Basque, MD.    Past Medical History:  Diagnosis Date   Arthritis    Asthma    Breast cancer (HCC) 2010   left breast cancer-DCIS   CPAP (continuous positive airway pressure) dependence 15 years   Depression    Dermatitis    Diabetes (HCC)    non-insulin dependent   Dyspnea    Emphysema    Fatty liver disease, nonalcoholic    GERD (gastroesophageal reflux disease)    History of hiatal hernia    Hyperlipidemia    Lung cancer (HCC)    Melanoma (HCC)    right temple   Neuromuscular disorder (HCC)    neuropathy   Obesity, unspecified    Personal history of malignant neoplasm of breast    left breast DCIS   Personal history of malignant neoplasm of bronchus and lung    right lung cancer   Personal history of radiation therapy 2010   f/u left breast cancer    Personal history of tobacco use, presenting hazards to health    Reflux esophagitis    Screening for  obesity    Seasonal allergies    Sleep apnea    cpap   Ulcer    peptic   Unspecified essential hypertension    Varicose veins of lower extremities with other complications     Past Surgical History:  Procedure Laterality Date   ABDOMINAL HYSTERECTOMY  1972   BACK SURGERY  02/2018   [place]   BREAST BIOPSY Right 2010?   Dr. Lorel Roes, benign   BREAST BIOPSY Left 09/27/2017   Fibroadenomatoid changes associated coarse calcifications, focal columnar cell change w/ calcs   BREAST EXCISIONAL BIOPSY Left 2010   DCIS   BREAST LUMPECTOMY Left 2010   Radiation for DCIS   BREAST SURGERY Left 2010   mammosite balloon placement and removal   CATARACT EXTRACTION W/PHACO Right 09/12/2016   Procedure: CATARACT EXTRACTION PHACO AND INTRAOCULAR LENS PLACEMENT (IOC);  Surgeon: Clair Crews, MD;  Location: [facility] ORS;  Service: Ophthalmology;  Laterality: Right;  US  00:38.2 AP% 10.9 CDE 4.16 Fluid Pack lot # [id number]   CATARACT EXTRACTION W/PHACO Left 10/03/2016   Procedure: CATARACT EXTRACTION PHACO AND INTRAOCULAR LENS PLACEMENT (IOC);  Surgeon: Clair Crews, MD;  Location: [facility] ORS;  Service: Ophthalmology;  Laterality: Left;  US  00:37 AP% 20.6 CDE  7.69 Fluid pack lot # [id number]   COLONOSCOPY  2007 and 2012   Dr. Felicita Horns at [facility]; polyps   COLONOSCOPY WITH PROPOFOL  N/A 10/28/2015   Procedure: COLONOSCOPY WITH PROPOFOL ;  Surgeon: Cassie Click, MD;  Location: [facility] ENDOSCOPY;  Service: Endoscopy;  Laterality: N/A;   ESOPHAGOGASTRODUODENOSCOPY (EGD) WITH PROPOFOL  N/A 10/28/2015   Procedure: ESOPHAGOGASTRODUODENOSCOPY (EGD) WITH PROPOFOL ;  Surgeon: Cassie Click, MD;  Location: [facility] ENDOSCOPY;  Service: Endoscopy;  Laterality: N/A;   EYE SURGERY  1992   HERNIA REPAIR  1990   KNEE SURGERY  1999   knee replaced   KNEE SURGERY  1977   knees replaced   LEFT HEART CATH AND CORONARY ANGIOGRAPHY Left 03/05/2017   Procedure: LEFT HEART CATH AND CORONARY ANGIOGRAPHY;  Surgeon: Ronney Cola,  MD;  Location: [facility] INVASIVE CV LAB;  Service: Cardiovascular;  Laterality: Left;   LOBECTOMY  1989   MELANOMA EXCISION  December 07, 2009   right temple   MOHS SURGERY  2016   [facility], nose   SALPINGOOPHORECTOMY  4540   VEIN SURGERY  September 2011   vein closure procedure left leg; RF ablation of GSV in both legs    Social History Social History   Tobacco Use   Smoking status: Former    Current packs/day: 0.00    Types: Cigarettes    Start date: 04/10/1958    Quit date: 04/10/1988    Years since quitting: 35.3   Smokeless tobacco: Never  Substance Use Topics   Alcohol use: Yes    Alcohol/week: 0.0 standard drinks of alcohol    Comment: once in a while   Drug use: No    Family History Family History  Problem Relation Age of Onset   Ovarian cancer Mother    Breast cancer Neg Hx     Allergies  Allergen Reactions   Ranitidine Hcl Hives    Other reaction(s): Unknown   Balsam [place]-Castor [Balsam [place]-Castor Oil] Other (See Comments)    Unknown  According to [facility] - positive patch test    Carafate [Sucralfate] Itching   Dmdm Hydantoin Other (See Comments)    Unknown  According to [facility] - positive patch tes   Lisinopril Swelling   Peanut-Containing Drug Products Swelling   Pepcid [Famotidine] Itching   Silver Other (See Comments)    blisters   Tape Itching    Redness    Zantac [Ranitidine Hcl] Hives   Xanax [Alprazolam] Rash     REVIEW OF SYSTEMS (Negative unless checked)  Constitutional: [] Weight loss  [] Fever  [] Chills Cardiac: [] Chest pain   [] Chest pressure   [] Palpitations   [] Shortness of breath when laying flat   [] Shortness of breath with exertion. Vascular:  [x] Pain in legs with walking   [] Pain in legs at rest  [] History of DVT   [] Phlebitis   [] Swelling in legs   [] Varicose veins   [] Non-healing ulcers Pulmonary:   [] Uses home oxygen   [] Productive cough   [] Hemoptysis   [] Wheeze  [] COPD   [] Asthma Neurologic:  [] Dizziness   [] Seizures   [] History of stroke    [] History of TIA  [] Aphasia   [] Vissual changes   [] Weakness or numbness in arm   [] Weakness or numbness in leg Musculoskeletal:   [] Joint swelling   [] Joint pain   [] Low back pain Hematologic:  [] Easy bruising  [] Easy bleeding   [] Hypercoagulable state   [] Anemic Gastrointestinal:  [] Diarrhea   [] Vomiting  [] Gastroesophageal reflux/heartburn   [] Difficulty swallowing. Genitourinary:  [] Chronic  kidney disease   [] Difficult urination  [] Frequent urination   [] Blood in urine Skin:  [] Rashes   [] Ulcers  Psychological:  [] History of anxiety   []  History of major depression.  Physical Examination  There were no vitals filed for this visit. There is no height or weight on file to calculate BMI. Gen: WD/WN, NAD Head: [place]/AT, No temporalis wasting.  Ear/Nose/Throat: Hearing grossly intact, nares w/o erythema or drainage Eyes: PER, EOMI, sclera nonicteric.  Neck: Supple, no masses.  No bruit or JVD.  Pulmonary:  Good air movement, no audible wheezing, no use of accessory muscles.  Cardiac: RRR, normal S1, S2, no Murmurs. Vascular:  carotid bruit noted Vessel Right Left  Radial Palpable Palpable  Carotid  Palpable  Palpable  Subclav  Palpable Palpable  Gastrointestinal: soft, non-distended. No guarding/no peritoneal signs.  Musculoskeletal: M/S 5/5 throughout.  No visible deformity.  Neurologic: CN 2-12 intact. Pain and light touch intact in extremities.  Symmetrical.  Speech is fluent. Motor exam as listed above. Psychiatric: Judgment intact, Mood & affect appropriate for pt's clinical situation. Dermatologic: No rashes or ulcers noted.  No changes consistent with cellulitis.   CBC Lab Results  Component Value Date   WBC 5.6 12/13/2017   HGB 13.5 12/13/2017   HCT 38.8 12/13/2017   MCV 85.4 12/13/2017   PLT 203 12/13/2017    BMET    Component Value Date/Time   NA 135 12/13/2017 0759   NA 132 (L) 04/14/2013 2002   K 3.4 (L) 12/13/2017 0759   K 4.7 04/14/2013 2002   CL 97 (L)  12/13/2017 0759   CL 100 04/14/2013 2002   CO2 26 12/13/2017 0759   CO2 26 04/14/2013 2002   GLUCOSE 144 (H) 12/13/2017 0759   GLUCOSE 131 (H) 04/14/2013 2002   BUN 16 12/13/2017 0759   BUN 23 (H) 04/14/2013 2002   CREATININE 0.91 12/13/2017 0759   CREATININE 0.63 04/14/2013 2002   CALCIUM 8.6 (L) 12/13/2017 0759   CALCIUM 9.1 04/14/2013 2002   GFRNONAA 59 (L) 12/13/2017 0759   GFRNONAA >60 04/14/2013 2002   GFRAA >60 12/13/2017 0759   GFRAA >60 04/14/2013 2002   CrCl cannot be calculated (Patient's most recent lab result is older than the maximum 21 days allowed.).  COAG No results found for: "INR", "PROTIME"  Radiology No results found.   Assessment/Plan There are no diagnoses linked to this encounter.   Devon Fogo, MD  08/21/2023 8:33 PM

## 2023-08-22 ENCOUNTER — Other Ambulatory Visit (INDEPENDENT_AMBULATORY_CARE_PROVIDER_SITE_OTHER): Payer: Self-pay | Admitting: Vascular Surgery

## 2023-08-22 DIAGNOSIS — I6523 Occlusion and stenosis of bilateral carotid arteries: Secondary | ICD-10-CM

## 2023-08-23 ENCOUNTER — Encounter (INDEPENDENT_AMBULATORY_CARE_PROVIDER_SITE_OTHER): Payer: PPO

## 2023-08-23 ENCOUNTER — Ambulatory Visit (INDEPENDENT_AMBULATORY_CARE_PROVIDER_SITE_OTHER): Payer: PPO | Admitting: Vascular Surgery

## 2023-08-23 DIAGNOSIS — I872 Venous insufficiency (chronic) (peripheral): Secondary | ICD-10-CM

## 2023-08-23 DIAGNOSIS — J449 Chronic obstructive pulmonary disease, unspecified: Secondary | ICD-10-CM

## 2023-08-23 DIAGNOSIS — I6523 Occlusion and stenosis of bilateral carotid arteries: Secondary | ICD-10-CM

## 2023-08-23 DIAGNOSIS — I10 Essential (primary) hypertension: Secondary | ICD-10-CM

## 2023-08-23 DIAGNOSIS — I25118 Atherosclerotic heart disease of native coronary artery with other forms of angina pectoris: Secondary | ICD-10-CM

## 2023-08-24 DIAGNOSIS — M19071 Primary osteoarthritis, right ankle and foot: Secondary | ICD-10-CM | POA: Diagnosis not present

## 2023-09-05 DIAGNOSIS — M47817 Spondylosis without myelopathy or radiculopathy, lumbosacral region: Secondary | ICD-10-CM | POA: Diagnosis not present

## 2023-09-23 DIAGNOSIS — S81801A Unspecified open wound, right lower leg, initial encounter: Secondary | ICD-10-CM | POA: Diagnosis not present

## 2023-09-23 DIAGNOSIS — G4730 Sleep apnea, unspecified: Secondary | ICD-10-CM | POA: Diagnosis not present

## 2023-09-23 DIAGNOSIS — E119 Type 2 diabetes mellitus without complications: Secondary | ICD-10-CM | POA: Diagnosis not present

## 2023-09-27 DIAGNOSIS — M25512 Pain in left shoulder: Secondary | ICD-10-CM | POA: Diagnosis not present

## 2023-09-27 DIAGNOSIS — M48061 Spinal stenosis, lumbar region without neurogenic claudication: Secondary | ICD-10-CM | POA: Diagnosis not present

## 2023-09-27 DIAGNOSIS — M791 Myalgia, unspecified site: Secondary | ICD-10-CM | POA: Diagnosis not present

## 2023-09-27 DIAGNOSIS — M51369 Other intervertebral disc degeneration, lumbar region without mention of lumbar back pain or lower extremity pain: Secondary | ICD-10-CM | POA: Diagnosis not present

## 2023-10-01 DIAGNOSIS — N1831 Chronic kidney disease, stage 3a: Secondary | ICD-10-CM | POA: Diagnosis not present

## 2023-10-01 DIAGNOSIS — T148XXA Other injury of unspecified body region, initial encounter: Secondary | ICD-10-CM | POA: Diagnosis not present

## 2023-10-24 DIAGNOSIS — E538 Deficiency of other specified B group vitamins: Secondary | ICD-10-CM | POA: Diagnosis not present

## 2023-10-24 DIAGNOSIS — I10 Essential (primary) hypertension: Secondary | ICD-10-CM | POA: Diagnosis not present

## 2023-10-24 DIAGNOSIS — E782 Mixed hyperlipidemia: Secondary | ICD-10-CM | POA: Diagnosis not present

## 2023-10-24 DIAGNOSIS — Z0000 Encounter for general adult medical examination without abnormal findings: Secondary | ICD-10-CM | POA: Diagnosis not present

## 2023-10-24 DIAGNOSIS — E119 Type 2 diabetes mellitus without complications: Secondary | ICD-10-CM | POA: Diagnosis not present

## 2023-10-24 DIAGNOSIS — Z1331 Encounter for screening for depression: Secondary | ICD-10-CM | POA: Diagnosis not present

## 2023-10-24 DIAGNOSIS — I25118 Atherosclerotic heart disease of native coronary artery with other forms of angina pectoris: Secondary | ICD-10-CM | POA: Diagnosis not present

## 2023-10-24 DIAGNOSIS — M545 Low back pain, unspecified: Secondary | ICD-10-CM | POA: Diagnosis not present

## 2023-10-24 DIAGNOSIS — Z1231 Encounter for screening mammogram for malignant neoplasm of breast: Secondary | ICD-10-CM | POA: Diagnosis not present

## 2023-10-24 DIAGNOSIS — G8929 Other chronic pain: Secondary | ICD-10-CM | POA: Diagnosis not present

## 2023-10-24 DIAGNOSIS — Z794 Long term (current) use of insulin: Secondary | ICD-10-CM | POA: Diagnosis not present

## 2023-10-24 DIAGNOSIS — M810 Age-related osteoporosis without current pathological fracture: Secondary | ICD-10-CM | POA: Diagnosis not present

## 2023-10-25 DIAGNOSIS — E782 Mixed hyperlipidemia: Secondary | ICD-10-CM | POA: Diagnosis not present

## 2023-10-25 DIAGNOSIS — Z794 Long term (current) use of insulin: Secondary | ICD-10-CM | POA: Diagnosis not present

## 2023-10-25 DIAGNOSIS — E119 Type 2 diabetes mellitus without complications: Secondary | ICD-10-CM | POA: Diagnosis not present

## 2023-10-25 DIAGNOSIS — E538 Deficiency of other specified B group vitamins: Secondary | ICD-10-CM | POA: Diagnosis not present

## 2023-10-25 DIAGNOSIS — M810 Age-related osteoporosis without current pathological fracture: Secondary | ICD-10-CM | POA: Diagnosis not present

## 2023-10-25 DIAGNOSIS — I10 Essential (primary) hypertension: Secondary | ICD-10-CM | POA: Diagnosis not present

## 2023-10-29 ENCOUNTER — Other Ambulatory Visit: Payer: Self-pay | Admitting: Family Medicine

## 2023-10-29 DIAGNOSIS — Z1231 Encounter for screening mammogram for malignant neoplasm of breast: Secondary | ICD-10-CM

## 2023-11-01 DIAGNOSIS — L989 Disorder of the skin and subcutaneous tissue, unspecified: Secondary | ICD-10-CM | POA: Diagnosis not present

## 2023-11-05 DIAGNOSIS — J4520 Mild intermittent asthma, uncomplicated: Secondary | ICD-10-CM | POA: Diagnosis not present

## 2023-11-05 DIAGNOSIS — J301 Allergic rhinitis due to pollen: Secondary | ICD-10-CM | POA: Diagnosis not present

## 2023-11-05 DIAGNOSIS — G4733 Obstructive sleep apnea (adult) (pediatric): Secondary | ICD-10-CM | POA: Diagnosis not present

## 2023-11-05 DIAGNOSIS — R413 Other amnesia: Secondary | ICD-10-CM | POA: Diagnosis not present

## 2023-11-05 DIAGNOSIS — F03A0 Unspecified dementia, mild, without behavioral disturbance, psychotic disturbance, mood disturbance, and anxiety: Secondary | ICD-10-CM | POA: Diagnosis not present

## 2023-11-05 DIAGNOSIS — R296 Repeated falls: Secondary | ICD-10-CM | POA: Diagnosis not present

## 2023-11-13 ENCOUNTER — Ambulatory Visit
Admission: RE | Admit: 2023-11-13 | Discharge: 2023-11-13 | Disposition: A | Discharge status: Home / Self Care | Source: Ambulatory Visit | Attending: Family Medicine | Admitting: Family Medicine

## 2023-11-13 DIAGNOSIS — Z1231 Encounter for screening mammogram for malignant neoplasm of breast: Secondary | ICD-10-CM | POA: Diagnosis not present

## 2023-11-15 DIAGNOSIS — L905 Scar conditions and fibrosis of skin: Secondary | ICD-10-CM | POA: Diagnosis not present

## 2023-11-15 DIAGNOSIS — C44722 Squamous cell carcinoma of skin of right lower limb, including hip: Secondary | ICD-10-CM | POA: Diagnosis not present

## 2023-11-15 DIAGNOSIS — D485 Neoplasm of uncertain behavior of skin: Secondary | ICD-10-CM | POA: Diagnosis not present

## 2023-11-23 DIAGNOSIS — H43813 Vitreous degeneration, bilateral: Secondary | ICD-10-CM | POA: Diagnosis not present

## 2023-11-23 DIAGNOSIS — E119 Type 2 diabetes mellitus without complications: Secondary | ICD-10-CM | POA: Diagnosis not present

## 2023-11-23 DIAGNOSIS — H353131 Nonexudative age-related macular degeneration, bilateral, early dry stage: Secondary | ICD-10-CM | POA: Diagnosis not present

## 2023-12-26 DIAGNOSIS — C44722 Squamous cell carcinoma of skin of right lower limb, including hip: Secondary | ICD-10-CM | POA: Diagnosis not present

## 2024-01-07 DIAGNOSIS — N1832 Chronic kidney disease, stage 3b: Secondary | ICD-10-CM | POA: Diagnosis not present

## 2024-01-07 DIAGNOSIS — R7989 Other specified abnormal findings of blood chemistry: Secondary | ICD-10-CM | POA: Diagnosis not present

## 2024-01-07 DIAGNOSIS — C4490 Unspecified malignant neoplasm of skin, unspecified: Secondary | ICD-10-CM | POA: Diagnosis not present

## 2024-01-07 DIAGNOSIS — R350 Frequency of micturition: Secondary | ICD-10-CM | POA: Diagnosis not present

## 2024-01-07 DIAGNOSIS — I872 Venous insufficiency (chronic) (peripheral): Secondary | ICD-10-CM | POA: Diagnosis not present

## 2024-01-09 DIAGNOSIS — Z48817 Encounter for surgical aftercare following surgery on the skin and subcutaneous tissue: Secondary | ICD-10-CM | POA: Diagnosis not present

## 2024-01-18 DIAGNOSIS — L03115 Cellulitis of right lower limb: Secondary | ICD-10-CM | POA: Diagnosis not present

## 2024-01-18 DIAGNOSIS — L03116 Cellulitis of left lower limb: Secondary | ICD-10-CM | POA: Diagnosis not present

## 2024-01-24 DIAGNOSIS — L97811 Non-pressure chronic ulcer of other part of right lower leg limited to breakdown of skin: Secondary | ICD-10-CM | POA: Diagnosis not present

## 2024-01-24 DIAGNOSIS — L97821 Non-pressure chronic ulcer of other part of left lower leg limited to breakdown of skin: Secondary | ICD-10-CM | POA: Diagnosis not present

## 2024-01-29 DIAGNOSIS — J449 Chronic obstructive pulmonary disease, unspecified: Secondary | ICD-10-CM | POA: Diagnosis not present

## 2024-01-29 DIAGNOSIS — K760 Fatty (change of) liver, not elsewhere classified: Secondary | ICD-10-CM | POA: Diagnosis not present

## 2024-01-29 DIAGNOSIS — F0393 Unspecified dementia, unspecified severity, with mood disturbance: Secondary | ICD-10-CM | POA: Diagnosis not present

## 2024-01-29 DIAGNOSIS — L97811 Non-pressure chronic ulcer of other part of right lower leg limited to breakdown of skin: Secondary | ICD-10-CM | POA: Diagnosis not present

## 2024-01-29 DIAGNOSIS — Z8673 Personal history of transient ischemic attack (TIA), and cerebral infarction without residual deficits: Secondary | ICD-10-CM | POA: Diagnosis not present

## 2024-01-29 DIAGNOSIS — I872 Venous insufficiency (chronic) (peripheral): Secondary | ICD-10-CM | POA: Diagnosis not present

## 2024-01-29 DIAGNOSIS — Z85118 Personal history of other malignant neoplasm of bronchus and lung: Secondary | ICD-10-CM | POA: Diagnosis not present

## 2024-01-29 DIAGNOSIS — I10 Essential (primary) hypertension: Secondary | ICD-10-CM | POA: Diagnosis not present

## 2024-01-29 DIAGNOSIS — E785 Hyperlipidemia, unspecified: Secondary | ICD-10-CM | POA: Diagnosis not present

## 2024-01-29 DIAGNOSIS — I2510 Atherosclerotic heart disease of native coronary artery without angina pectoris: Secondary | ICD-10-CM | POA: Diagnosis not present

## 2024-01-29 DIAGNOSIS — Z85820 Personal history of malignant melanoma of skin: Secondary | ICD-10-CM | POA: Diagnosis not present

## 2024-01-29 DIAGNOSIS — M810 Age-related osteoporosis without current pathological fracture: Secondary | ICD-10-CM | POA: Diagnosis not present

## 2024-01-29 DIAGNOSIS — Z483 Aftercare following surgery for neoplasm: Secondary | ICD-10-CM | POA: Diagnosis not present

## 2024-01-29 DIAGNOSIS — E1140 Type 2 diabetes mellitus with diabetic neuropathy, unspecified: Secondary | ICD-10-CM | POA: Diagnosis not present

## 2024-01-29 DIAGNOSIS — Z87891 Personal history of nicotine dependence: Secondary | ICD-10-CM | POA: Diagnosis not present

## 2024-01-29 DIAGNOSIS — K2100 Gastro-esophageal reflux disease with esophagitis, without bleeding: Secondary | ICD-10-CM | POA: Diagnosis not present

## 2024-01-29 DIAGNOSIS — Z853 Personal history of malignant neoplasm of breast: Secondary | ICD-10-CM | POA: Diagnosis not present

## 2024-01-29 DIAGNOSIS — Z7951 Long term (current) use of inhaled steroids: Secondary | ICD-10-CM | POA: Diagnosis not present

## 2024-01-29 DIAGNOSIS — C4490 Unspecified malignant neoplasm of skin, unspecified: Secondary | ICD-10-CM | POA: Diagnosis not present

## 2024-01-29 DIAGNOSIS — Z85038 Personal history of other malignant neoplasm of large intestine: Secondary | ICD-10-CM | POA: Diagnosis not present

## 2024-01-29 DIAGNOSIS — F32A Depression, unspecified: Secondary | ICD-10-CM | POA: Diagnosis not present

## 2024-01-29 DIAGNOSIS — G4730 Sleep apnea, unspecified: Secondary | ICD-10-CM | POA: Diagnosis not present

## 2024-01-29 DIAGNOSIS — Z96612 Presence of left artificial shoulder joint: Secondary | ICD-10-CM | POA: Diagnosis not present

## 2024-01-29 DIAGNOSIS — L97821 Non-pressure chronic ulcer of other part of left lower leg limited to breakdown of skin: Secondary | ICD-10-CM | POA: Diagnosis not present

## 2024-02-18 DIAGNOSIS — J449 Chronic obstructive pulmonary disease, unspecified: Secondary | ICD-10-CM | POA: Diagnosis not present

## 2024-02-18 DIAGNOSIS — I872 Venous insufficiency (chronic) (peripheral): Secondary | ICD-10-CM | POA: Diagnosis not present

## 2024-02-18 DIAGNOSIS — L97811 Non-pressure chronic ulcer of other part of right lower leg limited to breakdown of skin: Secondary | ICD-10-CM | POA: Diagnosis not present

## 2024-02-18 DIAGNOSIS — M810 Age-related osteoporosis without current pathological fracture: Secondary | ICD-10-CM | POA: Diagnosis not present

## 2024-02-18 DIAGNOSIS — F32A Depression, unspecified: Secondary | ICD-10-CM | POA: Diagnosis not present

## 2024-02-18 DIAGNOSIS — F0393 Unspecified dementia, unspecified severity, with mood disturbance: Secondary | ICD-10-CM | POA: Diagnosis not present

## 2024-02-18 DIAGNOSIS — L97821 Non-pressure chronic ulcer of other part of left lower leg limited to breakdown of skin: Secondary | ICD-10-CM | POA: Diagnosis not present

## 2024-03-04 DIAGNOSIS — M19071 Primary osteoarthritis, right ankle and foot: Secondary | ICD-10-CM | POA: Diagnosis not present
